# Patient Record
Sex: Male | Born: 1940
Health system: Southern US, Community
[De-identification: ages and names within clinical notes are randomized; demographics above are authoritative.]

## PROBLEM LIST (undated history)

## (undated) DIAGNOSIS — J45909 Unspecified asthma, uncomplicated: Secondary | ICD-10-CM

## (undated) DIAGNOSIS — N400 Enlarged prostate without lower urinary tract symptoms: Secondary | ICD-10-CM

## (undated) DIAGNOSIS — C801 Malignant (primary) neoplasm, unspecified: Secondary | ICD-10-CM

## (undated) DIAGNOSIS — T4145XA Adverse effect of unspecified anesthetic, initial encounter: Secondary | ICD-10-CM

## (undated) DIAGNOSIS — F32A Depression, unspecified: Secondary | ICD-10-CM

## (undated) DIAGNOSIS — G250 Essential tremor: Secondary | ICD-10-CM

## (undated) DIAGNOSIS — K635 Polyp of colon: Secondary | ICD-10-CM

## (undated) DIAGNOSIS — R0789 Other chest pain: Secondary | ICD-10-CM

## (undated) DIAGNOSIS — A048 Other specified bacterial intestinal infections: Secondary | ICD-10-CM

## (undated) DIAGNOSIS — K219 Gastro-esophageal reflux disease without esophagitis: Secondary | ICD-10-CM

## (undated) DIAGNOSIS — F419 Anxiety disorder, unspecified: Secondary | ICD-10-CM

## (undated) DIAGNOSIS — M549 Dorsalgia, unspecified: Secondary | ICD-10-CM

## (undated) DIAGNOSIS — E785 Hyperlipidemia, unspecified: Secondary | ICD-10-CM

## (undated) DIAGNOSIS — G2581 Restless legs syndrome: Secondary | ICD-10-CM

## (undated) DIAGNOSIS — T8859XA Other complications of anesthesia, initial encounter: Secondary | ICD-10-CM

## (undated) DIAGNOSIS — F329 Major depressive disorder, single episode, unspecified: Secondary | ICD-10-CM

## (undated) DIAGNOSIS — G709 Myoneural disorder, unspecified: Secondary | ICD-10-CM

## (undated) DIAGNOSIS — I10 Essential (primary) hypertension: Secondary | ICD-10-CM

## (undated) DIAGNOSIS — J449 Chronic obstructive pulmonary disease, unspecified: Secondary | ICD-10-CM

## (undated) DIAGNOSIS — Z8489 Family history of other specified conditions: Secondary | ICD-10-CM

## (undated) HISTORY — PX: NASAL SINUS SURGERY: SHX719

## (undated) HISTORY — DX: Unspecified asthma, uncomplicated: J45.909

## (undated) HISTORY — DX: Essential tremor: G25.0

## (undated) HISTORY — PX: TRIGGER FINGER RELEASE: SHX641

## (undated) HISTORY — PX: HERNIA REPAIR: SHX51

## (undated) HISTORY — PX: ROTATOR CUFF REPAIR: SHX139

## (undated) HISTORY — PX: ESOPHAGOGASTRODUODENOSCOPY: SHX1529

## (undated) HISTORY — PX: CATARACT EXTRACTION: SUR2

---
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
## 2005-10-31 ENCOUNTER — Ambulatory Visit: Payer: Self-pay | Admitting: Unknown Physician Specialty

## 2006-02-03 ENCOUNTER — Ambulatory Visit: Payer: Self-pay | Admitting: Unknown Physician Specialty

## 2006-06-09 ENCOUNTER — Ambulatory Visit: Payer: Self-pay | Admitting: Internal Medicine

## 2007-02-11 ENCOUNTER — Ambulatory Visit: Payer: Self-pay | Admitting: Family Medicine

## 2011-01-04 HISTORY — PX: COLONOSCOPY: SHX174

## 2011-01-10 ENCOUNTER — Ambulatory Visit: Payer: Self-pay | Admitting: Unknown Physician Specialty

## 2011-01-12 LAB — PATHOLOGY REPORT

## 2011-09-12 ENCOUNTER — Ambulatory Visit: Payer: Self-pay | Admitting: Unknown Physician Specialty

## 2011-10-14 ENCOUNTER — Ambulatory Visit: Payer: Self-pay | Admitting: Internal Medicine

## 2011-12-12 ENCOUNTER — Ambulatory Visit: Payer: Self-pay | Admitting: Unknown Physician Specialty

## 2011-12-13 LAB — PATHOLOGY REPORT

## 2012-11-21 ENCOUNTER — Ambulatory Visit: Payer: Self-pay | Admitting: Internal Medicine

## 2012-11-21 ENCOUNTER — Encounter: Payer: Self-pay | Admitting: Pulmonary Disease

## 2013-11-04 ENCOUNTER — Encounter: Payer: Self-pay | Admitting: Pulmonary Disease

## 2013-11-04 ENCOUNTER — Ambulatory Visit: Payer: Self-pay | Admitting: Specialist

## 2014-03-21 DIAGNOSIS — J330 Polyp of nasal cavity: Secondary | ICD-10-CM | POA: Diagnosis not present

## 2014-03-21 DIAGNOSIS — J328 Other chronic sinusitis: Secondary | ICD-10-CM | POA: Diagnosis not present

## 2014-03-21 DIAGNOSIS — R0981 Nasal congestion: Secondary | ICD-10-CM | POA: Diagnosis not present

## 2014-03-26 DIAGNOSIS — G250 Essential tremor: Secondary | ICD-10-CM | POA: Diagnosis not present

## 2014-03-26 DIAGNOSIS — J328 Other chronic sinusitis: Secondary | ICD-10-CM | POA: Diagnosis not present

## 2014-04-01 DIAGNOSIS — J986 Disorders of diaphragm: Secondary | ICD-10-CM | POA: Diagnosis not present

## 2014-04-01 DIAGNOSIS — J449 Chronic obstructive pulmonary disease, unspecified: Secondary | ICD-10-CM | POA: Diagnosis not present

## 2014-04-02 DIAGNOSIS — E780 Pure hypercholesterolemia: Secondary | ICD-10-CM | POA: Diagnosis not present

## 2014-04-09 DIAGNOSIS — E780 Pure hypercholesterolemia: Secondary | ICD-10-CM | POA: Diagnosis not present

## 2014-04-09 DIAGNOSIS — G250 Essential tremor: Secondary | ICD-10-CM | POA: Diagnosis not present

## 2014-04-09 DIAGNOSIS — I10 Essential (primary) hypertension: Secondary | ICD-10-CM | POA: Diagnosis not present

## 2014-04-09 DIAGNOSIS — F419 Anxiety disorder, unspecified: Secondary | ICD-10-CM | POA: Diagnosis not present

## 2014-04-10 DIAGNOSIS — J330 Polyp of nasal cavity: Secondary | ICD-10-CM | POA: Diagnosis not present

## 2014-04-10 DIAGNOSIS — J328 Other chronic sinusitis: Secondary | ICD-10-CM | POA: Diagnosis not present

## 2014-04-17 ENCOUNTER — Ambulatory Visit
Admit: 2014-04-17 | Disposition: A | Discharge status: Home / Self Care | Payer: Self-pay | Attending: Otolaryngology | Admitting: Otolaryngology

## 2014-04-17 DIAGNOSIS — Z87891 Personal history of nicotine dependence: Secondary | ICD-10-CM | POA: Diagnosis not present

## 2014-04-17 DIAGNOSIS — J324 Chronic pansinusitis: Secondary | ICD-10-CM | POA: Diagnosis not present

## 2014-04-17 DIAGNOSIS — J449 Chronic obstructive pulmonary disease, unspecified: Secondary | ICD-10-CM | POA: Diagnosis not present

## 2014-04-17 DIAGNOSIS — Z9889 Other specified postprocedural states: Secondary | ICD-10-CM | POA: Diagnosis not present

## 2014-04-17 DIAGNOSIS — J329 Chronic sinusitis, unspecified: Secondary | ICD-10-CM | POA: Diagnosis not present

## 2014-04-17 DIAGNOSIS — Z79899 Other long term (current) drug therapy: Secondary | ICD-10-CM | POA: Diagnosis not present

## 2014-04-17 DIAGNOSIS — J45909 Unspecified asthma, uncomplicated: Secondary | ICD-10-CM | POA: Diagnosis not present

## 2014-04-17 DIAGNOSIS — J338 Other polyp of sinus: Secondary | ICD-10-CM | POA: Diagnosis not present

## 2014-04-17 DIAGNOSIS — J322 Chronic ethmoidal sinusitis: Secondary | ICD-10-CM | POA: Diagnosis not present

## 2014-04-17 DIAGNOSIS — J323 Chronic sphenoidal sinusitis: Secondary | ICD-10-CM | POA: Diagnosis not present

## 2014-04-17 DIAGNOSIS — K219 Gastro-esophageal reflux disease without esophagitis: Secondary | ICD-10-CM | POA: Diagnosis not present

## 2014-04-17 DIAGNOSIS — Z91013 Allergy to seafood: Secondary | ICD-10-CM | POA: Diagnosis not present

## 2014-04-17 DIAGNOSIS — J330 Polyp of nasal cavity: Secondary | ICD-10-CM | POA: Diagnosis not present

## 2014-04-17 DIAGNOSIS — J320 Chronic maxillary sinusitis: Secondary | ICD-10-CM | POA: Diagnosis not present

## 2014-04-17 DIAGNOSIS — J321 Chronic frontal sinusitis: Secondary | ICD-10-CM | POA: Diagnosis not present

## 2014-04-23 DIAGNOSIS — J330 Polyp of nasal cavity: Secondary | ICD-10-CM | POA: Diagnosis not present

## 2014-04-23 DIAGNOSIS — J328 Other chronic sinusitis: Secondary | ICD-10-CM | POA: Diagnosis not present

## 2014-04-28 LAB — SURGICAL PATHOLOGY

## 2014-05-02 DIAGNOSIS — J328 Other chronic sinusitis: Secondary | ICD-10-CM | POA: Diagnosis not present

## 2014-05-09 DIAGNOSIS — J330 Polyp of nasal cavity: Secondary | ICD-10-CM | POA: Diagnosis not present

## 2014-05-09 DIAGNOSIS — J328 Other chronic sinusitis: Secondary | ICD-10-CM | POA: Diagnosis not present

## 2014-05-23 DIAGNOSIS — J328 Other chronic sinusitis: Secondary | ICD-10-CM | POA: Diagnosis not present

## 2014-05-23 DIAGNOSIS — J330 Polyp of nasal cavity: Secondary | ICD-10-CM | POA: Diagnosis not present

## 2014-05-28 DIAGNOSIS — G250 Essential tremor: Secondary | ICD-10-CM | POA: Diagnosis not present

## 2014-06-06 DIAGNOSIS — J330 Polyp of nasal cavity: Secondary | ICD-10-CM | POA: Diagnosis not present

## 2014-06-06 DIAGNOSIS — J301 Allergic rhinitis due to pollen: Secondary | ICD-10-CM | POA: Diagnosis not present

## 2014-06-06 DIAGNOSIS — J328 Other chronic sinusitis: Secondary | ICD-10-CM | POA: Diagnosis not present

## 2014-06-06 DIAGNOSIS — J0101 Acute recurrent maxillary sinusitis: Secondary | ICD-10-CM | POA: Diagnosis not present

## 2014-06-10 DIAGNOSIS — J301 Allergic rhinitis due to pollen: Secondary | ICD-10-CM | POA: Diagnosis not present

## 2014-06-26 DIAGNOSIS — J301 Allergic rhinitis due to pollen: Secondary | ICD-10-CM | POA: Diagnosis not present

## 2014-06-26 DIAGNOSIS — J330 Polyp of nasal cavity: Secondary | ICD-10-CM | POA: Diagnosis not present

## 2014-07-01 DIAGNOSIS — K4090 Unilateral inguinal hernia, without obstruction or gangrene, not specified as recurrent: Secondary | ICD-10-CM | POA: Diagnosis not present

## 2014-07-10 ENCOUNTER — Ambulatory Visit: Payer: Self-pay | Admitting: General Surgery

## 2014-07-10 ENCOUNTER — Encounter: Payer: Self-pay | Admitting: General Surgery

## 2014-07-10 ENCOUNTER — Ambulatory Visit (INDEPENDENT_AMBULATORY_CARE_PROVIDER_SITE_OTHER): Payer: Commercial Managed Care - HMO | Admitting: General Surgery

## 2014-07-10 VITALS — BP 110/62 | HR 68 | Resp 12 | Ht 70.0 in | Wt 197.0 lb

## 2014-07-10 DIAGNOSIS — G250 Essential tremor: Secondary | ICD-10-CM | POA: Diagnosis not present

## 2014-07-10 DIAGNOSIS — K4090 Unilateral inguinal hernia, without obstruction or gangrene, not specified as recurrent: Secondary | ICD-10-CM

## 2014-07-10 NOTE — Patient Instructions (Addendum)

## 2014-07-10 NOTE — Progress Notes (Signed)
Patient ID: Wesley Preston, male   DOB: 1940/01/10, 74 y.o.   MRN: [id number]  Chief Complaint  Patient presents with  . Other    right inguinal hernia    HPI Wesley Preston is a 74 y.o. male here today for a evaluation of a right inguinal hernia. Patient noticed this about two weeks again. He states the area pops in and out. He noticed some swelling the week before he felt the knot.  HPI  Past Medical History  Diagnosis Date  . Reactive airway disease   . Essential tremor     Past Surgical History  Procedure Laterality Date  . Colonoscopy  2013  . Nasal sinus surgery      Family History  Problem Relation Age of Onset  . Heart disease Father     Social History History  Substance Use Topics  . Smoking status: Former Smoker -- 1.00 packs/day for 20 years    Types: Cigarettes  . Smokeless tobacco: Not on file  . Alcohol Use: 14.4 oz/week    24 Standard drinks or equivalent per week    No Known Allergies  Current Outpatient Prescriptions  Medication Sig Dispense Refill  . Docusate Calcium (STOOL SOFTENER PO)     . doxazosin (CARDURA) 4 MG tablet     . gabapentin (NEURONTIN) 100 MG capsule TAKE 1 CAPSULE (100 MG TOTAL) BY MOUTH 2 (TWO) TIMES DAILY.  3  . propranolol ER (INDERAL LA) 60 MG 24 hr capsule Take by mouth.    . SYMBICORT 160-4.5 MCG/ACT inhaler      No current facility-administered medications for this visit.    Review of Systems Review of Systems  Constitutional: Negative.   Respiratory: Positive for shortness of breath.   Cardiovascular: Negative.     Blood pressure 110/62, pulse 68, resp. rate 12, height 5\' 10"  (1.778 m), weight 197 lb (89.359 kg).  Physical Exam Physical Exam  Constitutional: He is oriented to person, place, and time. He appears well-developed and well-nourished.  HENT:  Mouth/Throat: Oropharynx is clear and moist.  Eyes: Conjunctivae are normal. No scleral icterus.  Neck: Neck supple.  Cardiovascular: Normal rate, regular  rhythm and normal heart sounds.   Pulmonary/Chest: Effort normal and breath sounds normal.  Abdominal: Soft. Normal appearance and bowel sounds are normal. There is no tenderness. A hernia is present. Hernia confirmed positive in the right inguinal area.  Lymphadenopathy:    He has no cervical adenopathy.  Neurological: He is alert and oriented to person, place, and time.  Skin: Skin is warm and dry.    Data Reviewed Medical evaluation by Ramonita Lab, M.D. dated 07/01/2014 was reviewed.  Assessment    Right inguinal hernia, symptomatic.    Plan         Hernia precautions and incarceration were discussed with the patient. If they develop symptoms of an incarcerated hernia, they were encouraged to seek prompt medical attention.  I have recommended repair of the hernia using mesh on an outpatient basis in the near future. The risk of infection was reviewed. The role of prosthetic mesh to minimize the risk of recurrence was reviewed.  Patient's surgery has been scheduled for 08-01-14 at [facility].   PCP:  Ocie Cornfield 07/12/2014, 8:28 AM   Evaluation by Beatris Si

## 2014-07-11 ENCOUNTER — Telehealth: Payer: Self-pay

## 2014-07-11 NOTE — Telephone Encounter (Signed)
Patient called and wanted to reschedule his surgery scheduled for 07/25/14. The patient is not rescheduled for surgery at [facility] on 08/01/14. He is aware of date and instructions. OR scheduling has been notified of this change.

## 2014-07-12 DIAGNOSIS — K4090 Unilateral inguinal hernia, without obstruction or gangrene, not specified as recurrent: Secondary | ICD-10-CM | POA: Insufficient documentation

## 2014-07-12 NOTE — H&P (Signed)
Patient ID: Wesley Preston, male DOB: March 05, 1940, 74 y.o. MRN: [id number]  Chief Complaint   Patient presents with   .  Other     right inguinal hernia    HPI  Wesley Preston is a 74 y.o. male here today for a evaluation of a right inguinal hernia. Patient noticed this about two weeks again. He states the area pops in and out. He noticed some swelling the week before he felt the knot.  HPI  Past Medical History   Diagnosis  Date   .  Reactive airway disease    .  Essential tremor     Past Surgical History   Procedure  Laterality  Date   .  Colonoscopy   2013   .  Nasal sinus surgery      Family History   Problem  Relation  Age of Onset   .  Heart disease  Father     Social History  History   Substance Use Topics   .  Smoking status:  Former Smoker -- 1.00 packs/day for 20 years     Types:  Cigarettes   .  Smokeless tobacco:  Not on file   .  Alcohol Use:  14.4 oz/week     24 Standard drinks or equivalent per week    No Known Allergies  Current Outpatient Prescriptions   Medication  Sig  Dispense  Refill   .  Docusate Calcium (STOOL SOFTENER PO)      .  doxazosin (CARDURA) 4 MG tablet      .  gabapentin (NEURONTIN) 100 MG capsule  TAKE 1 CAPSULE (100 MG TOTAL) BY MOUTH 2 (TWO) TIMES DAILY.   3   .  propranolol ER (INDERAL LA) 60 MG 24 hr capsule  Take by mouth.     .  SYMBICORT 160-4.5 MCG/ACT inhaler       No current facility-administered medications for this visit.    Review of Systems  Review of Systems  Constitutional: Negative.  Respiratory: Positive for shortness of breath.  Cardiovascular: Negative.   Blood pressure 110/62, pulse 68, resp. rate 12, height 5\' 10"  (1.778 m), weight 197 lb (89.359 kg).  Physical Exam  Physical Exam  Constitutional: He is oriented to person, place, and time. He appears well-developed and well-nourished.  HENT:  Mouth/Throat: Oropharynx is clear and moist.  Eyes: Conjunctivae are normal. No scleral icterus.  Neck: Neck supple.   Cardiovascular: Normal rate, regular rhythm and normal heart sounds.  Pulmonary/Chest: Effort normal and breath sounds normal.  Abdominal: Soft. Normal appearance and bowel sounds are normal. There is no tenderness. A hernia is present. Hernia confirmed positive in the right inguinal area.  Lymphadenopathy:  He has no cervical adenopathy.  Neurological: He is alert and oriented to person, place, and time.  Skin: Skin is warm and dry.   Data Reviewed  Medical evaluation by Ramonita Lab, M.D. dated 07/01/2014 was reviewed.  Assessment   Right inguinal hernia, symptomatic.   Plan    Hernia precautions and incarceration were discussed with the patient. If they develop symptoms of an incarcerated hernia, they were encouraged to seek prompt medical attention.  I have recommended repair of the hernia using mesh on an outpatient basis in the near future. The risk of infection was reviewed. The role of prosthetic mesh to minimize the risk of recurrence was reviewed.  Patient's surgery has been scheduled for 08-01-14 at [facility].  PCP: Ocie Cornfield  07/12/2014, 8:28 AM

## 2014-07-16 ENCOUNTER — Encounter: Payer: Self-pay | Admitting: *Deleted

## 2014-07-16 ENCOUNTER — Inpatient Hospital Stay: Admission: RE | Admit: 2014-07-16 | Payer: Self-pay | Source: Ambulatory Visit

## 2014-07-16 NOTE — Patient Instructions (Signed)
  Your procedure is scheduled on: 08-01-14 Report to [place] To find out your arrival time please call [phone] between 1PM - 3PM on 07-31-14 (THURSDAY)  Remember: Instructions that are not followed completely may result in serious medical risk, up to and including death, or upon the discretion of your surgeon and anesthesiologist your surgery may need to be rescheduled.    _X___ 1. Do not eat food or drink liquids after midnight. No gum chewing or hard candies.     _X___ 2. No Alcohol for 24 hours before or after surgery.   ____ 3. Bring all medications with you on the day of surgery if instructed.    _X___ 4. Notify your doctor if there is any change in your medical condition     (cold, fever, infections).     Do not wear jewelry, make-up, hairpins, clips or nail polish.  Do not wear lotions, powders, or perfumes. You may wear deodorant.  Do not shave 48 hours prior to surgery. Men may shave face and neck.  Do not bring valuables to the hospital.    [facility] is not responsible for any belongings or valuables.               Contacts, dentures or bridgework may not be worn into surgery.  Leave your suitcase in the car. After surgery it may be brought to your room.  For patients admitted to the hospital, discharge time is determined by your treatment team.   Patients discharged the day of surgery will not be allowed to drive home.   Please read over the following fact sheets that you were given:      _X___ Take these medicines the morning of surgery with A SIP OF WATER:    1. PROPRANOLOL  2. GABAPENTIN  3.   4.  5.  6.  ____ Fleet Enema (as directed)   ____ Use CHG Soap as directed  __X__ Use inhalers on the day of surgery-USE SYMBICORT AT HOME   ____ Stop metformin 2 days prior to surgery    ____ Take 1/2 of usual insulin dose the night before surgery and none on the morning of surgery.   ____ Stop  Coumadin/Plavix/aspirin-N/A  ____ Stop Anti-inflammatories-NO NSAIDS OR ASPIRIN PRODUCTS-TYLENOL OK   ____ Stop supplements until after surgery.    ____ Bring C-Pap to the hospital.

## 2014-07-23 ENCOUNTER — Encounter
Admission: RE | Admit: 2014-07-23 | Discharge: 2014-07-23 | Disposition: A | Discharge status: Home / Self Care | Payer: Commercial Managed Care - HMO | Source: Ambulatory Visit | Attending: Anesthesiology | Admitting: Anesthesiology

## 2014-07-23 DIAGNOSIS — Z01810 Encounter for preprocedural cardiovascular examination: Secondary | ICD-10-CM | POA: Diagnosis not present

## 2014-07-25 NOTE — Pre-Procedure Instructions (Signed)
EKG SENT OVER TO DR Amie Critchley FOR REVIEW-OK PER DR Julien Girt

## 2014-08-01 ENCOUNTER — Ambulatory Visit: Payer: Commercial Managed Care - HMO | Admitting: *Deleted

## 2014-08-01 ENCOUNTER — Encounter
Admission: RE | Disposition: A | Discharge status: Home / Self Care | Payer: Self-pay | Source: Ambulatory Visit | Attending: General Surgery

## 2014-08-01 ENCOUNTER — Ambulatory Visit
Admission: RE | Admit: 2014-08-01 | Discharge: 2014-08-01 | Disposition: A | Discharge status: Home / Self Care | Payer: Commercial Managed Care - HMO | Source: Ambulatory Visit | Attending: General Surgery | Admitting: General Surgery

## 2014-08-01 DIAGNOSIS — Z87891 Personal history of nicotine dependence: Secondary | ICD-10-CM | POA: Diagnosis not present

## 2014-08-01 DIAGNOSIS — Z79899 Other long term (current) drug therapy: Secondary | ICD-10-CM | POA: Diagnosis not present

## 2014-08-01 DIAGNOSIS — K219 Gastro-esophageal reflux disease without esophagitis: Secondary | ICD-10-CM | POA: Diagnosis not present

## 2014-08-01 DIAGNOSIS — K4090 Unilateral inguinal hernia, without obstruction or gangrene, not specified as recurrent: Secondary | ICD-10-CM | POA: Insufficient documentation

## 2014-08-01 DIAGNOSIS — Z8249 Family history of ischemic heart disease and other diseases of the circulatory system: Secondary | ICD-10-CM | POA: Diagnosis not present

## 2014-08-01 DIAGNOSIS — J45909 Unspecified asthma, uncomplicated: Secondary | ICD-10-CM | POA: Insufficient documentation

## 2014-08-01 DIAGNOSIS — I10 Essential (primary) hypertension: Secondary | ICD-10-CM | POA: Insufficient documentation

## 2014-08-01 DIAGNOSIS — J449 Chronic obstructive pulmonary disease, unspecified: Secondary | ICD-10-CM | POA: Diagnosis not present

## 2014-08-01 DIAGNOSIS — Z9889 Other specified postprocedural states: Secondary | ICD-10-CM | POA: Insufficient documentation

## 2014-08-01 DIAGNOSIS — R251 Tremor, unspecified: Secondary | ICD-10-CM | POA: Diagnosis not present

## 2014-08-01 HISTORY — DX: Family history of other specified conditions: Z84.89

## 2014-08-01 HISTORY — DX: Gastro-esophageal reflux disease without esophagitis: K21.9

## 2014-08-01 HISTORY — DX: Chronic obstructive pulmonary disease, unspecified: J44.9

## 2014-08-01 HISTORY — DX: Myoneural disorder, unspecified: G70.9

## 2014-08-01 HISTORY — PX: INGUINAL HERNIA REPAIR: SHX194

## 2014-08-01 HISTORY — DX: Malignant (primary) neoplasm, unspecified: C80.1

## 2014-08-01 SURGERY — REPAIR, HERNIA, INGUINAL, ADULT
Anesthesia: General | Laterality: Right | Wound class: Clean

## 2014-08-01 MED ORDER — METOCLOPRAMIDE HCL 5 MG/ML IJ SOLN: 10.0000 mg | Freq: Once | INTRAMUSCULAR | Status: DC | PRN

## 2014-08-01 MED ORDER — CEFAZOLIN SODIUM-DEXTROSE 2-3 GM-% IV SOLR
2.0000 g | INTRAVENOUS | Status: AC
  Administered 2014-08-01: 2 g via INTRAVENOUS

## 2014-08-01 MED ORDER — EPHEDRINE SULFATE 50 MG/ML IJ SOLN
INTRAMUSCULAR | Status: DC | PRN
  Administered 2014-08-01: 10 mg via INTRAVENOUS

## 2014-08-01 MED ORDER — FAMOTIDINE 20 MG PO TABS
ORAL_TABLET | ORAL | Status: AC
  Filled 2014-08-01: qty 1

## 2014-08-01 MED ORDER — HYDROCODONE-ACETAMINOPHEN 5-325 MG PO TABS: 1.0000 | ORAL_TABLET | ORAL | Status: DC | PRN

## 2014-08-01 MED ORDER — BUPIVACAINE-EPINEPHRINE (PF) 0.5% -1:200000 IJ SOLN
INTRAMUSCULAR | Status: DC | PRN
Start: 2014-08-01 — End: 2014-08-01
  Administered 2014-08-01: 30 mL via PERINEURAL

## 2014-08-01 MED ORDER — FENTANYL CITRATE (PF) 100 MCG/2ML IJ SOLN: 25.0000 ug | INTRAMUSCULAR | Status: DC | PRN

## 2014-08-01 MED ORDER — HYDROCODONE-ACETAMINOPHEN 7.5-325 MG PO TABS: 1.0000 | ORAL_TABLET | Freq: Once | ORAL | Status: DC | PRN

## 2014-08-01 MED ORDER — FAMOTIDINE 20 MG PO TABS
20.0000 mg | ORAL_TABLET | Freq: Once | ORAL | Status: AC
  Administered 2014-08-01: 20 mg via ORAL

## 2014-08-01 MED ORDER — MIDAZOLAM HCL 2 MG/2ML IJ SOLN
INTRAMUSCULAR | Status: DC | PRN
  Administered 2014-08-01: 2 mg via INTRAVENOUS

## 2014-08-01 MED ORDER — FENTANYL CITRATE (PF) 100 MCG/2ML IJ SOLN
INTRAMUSCULAR | Status: DC | PRN
  Administered 2014-08-01 (×2): 50 ug via INTRAVENOUS

## 2014-08-01 MED ORDER — GLYCOPYRROLATE 0.2 MG/ML IJ SOLN
INTRAMUSCULAR | Status: DC | PRN
  Administered 2014-08-01: 0.2 mg via INTRAVENOUS

## 2014-08-01 MED ORDER — PROPOFOL 10 MG/ML IV BOLUS
INTRAVENOUS | Status: DC | PRN
  Administered 2014-08-01: 200 mg via INTRAVENOUS

## 2014-08-01 MED ORDER — BUPIVACAINE-EPINEPHRINE (PF) 0.5% -1:200000 IJ SOLN
INTRAMUSCULAR | Status: AC
  Filled 2014-08-01: qty 30

## 2014-08-01 MED ORDER — ACETAMINOPHEN 10 MG/ML IV SOLN
INTRAVENOUS | Status: DC | PRN
  Administered 2014-08-01: 1000 mg via INTRAVENOUS

## 2014-08-01 MED ORDER — ACETAMINOPHEN 10 MG/ML IV SOLN
INTRAVENOUS | Status: AC
  Filled 2014-08-01: qty 100

## 2014-08-01 MED ORDER — CEFAZOLIN SODIUM-DEXTROSE 2-3 GM-% IV SOLR
INTRAVENOUS | Status: AC
  Filled 2014-08-01: qty 50

## 2014-08-01 MED ORDER — KETOROLAC TROMETHAMINE 30 MG/ML IJ SOLN
INTRAMUSCULAR | Status: DC | PRN
  Administered 2014-08-01: 30 mg

## 2014-08-01 MED ORDER — PHENYLEPHRINE HCL 10 MG/ML IJ SOLN
INTRAMUSCULAR | Status: DC | PRN
  Administered 2014-08-01 (×3): 100 ug via INTRAVENOUS
  Administered 2014-08-01: 200 ug via INTRAVENOUS

## 2014-08-01 MED ORDER — LACTATED RINGERS IV SOLN
INTRAVENOUS | Status: DC
  Administered 2014-08-01 (×2): via INTRAVENOUS

## 2014-08-01 MED ORDER — LIDOCAINE HCL (CARDIAC) 20 MG/ML IV SOLN
INTRAVENOUS | Status: DC | PRN
  Administered 2014-08-01: 50 mg via INTRAVENOUS

## 2014-08-01 SURGICAL SUPPLY — 34 items
BENZOIN TINCTURE PRP APPL 2/3 (GAUZE/BANDAGES/DRESSINGS) ×3 IMPLANT
BLADE SURG 15 STRL SS SAFETY (BLADE) ×6 IMPLANT
CANISTER SUCT 1200ML W/VALVE (MISCELLANEOUS) ×3 IMPLANT
CHLORAPREP W/TINT 26ML (MISCELLANEOUS) ×3 IMPLANT
CLOSURE WOUND 1/2 X4 (GAUZE/BANDAGES/DRESSINGS) ×1
DECANTER SPIKE VIAL GLASS SM (MISCELLANEOUS) IMPLANT
DRAIN PENROSE 1/4X12 LTX (DRAIN) ×3 IMPLANT
DRAPE LAPAROTOMY 100X77 ABD (DRAPES) ×3 IMPLANT
DRESSING TELFA 4X3 1S ST N-ADH (GAUZE/BANDAGES/DRESSINGS) IMPLANT
DRSG TEGADERM 4X4.75 (GAUZE/BANDAGES/DRESSINGS) ×3 IMPLANT
DRSG TELFA 3X8 NADH (GAUZE/BANDAGES/DRESSINGS) ×3 IMPLANT
GLOVE BIO SURGEON STRL SZ7.5 (GLOVE) ×9 IMPLANT
GLOVE INDICATOR 8.0 STRL GRN (GLOVE) ×9 IMPLANT
GOWN STRL REUS W/ TWL LRG LVL3 (GOWN DISPOSABLE) ×3 IMPLANT
GOWN STRL REUS W/TWL LRG LVL3 (GOWN DISPOSABLE) ×6
KIT RM TURNOVER STRD PROC AR (KITS) ×3 IMPLANT
LABEL OR SOLS (LABEL) ×3 IMPLANT
MESH HERNIA SYS ULTRAPRO LRG (Mesh General) ×3 IMPLANT
NDL SAFETY 22GX1.5 (NEEDLE) ×6 IMPLANT
NDL SAFETY 25GX1.5 (NEEDLE) ×3 IMPLANT
PACK BASIN MINOR ARMC (MISCELLANEOUS) ×3 IMPLANT
PAD GROUND ADULT SPLIT (MISCELLANEOUS) ×3 IMPLANT
STRIP CLOSURE SKIN 1/2X4 (GAUZE/BANDAGES/DRESSINGS) ×2 IMPLANT
SUT SURGILON 0 BLK (SUTURE) ×3 IMPLANT
SUT VIC AB 2-0 SH 27 (SUTURE) ×2
SUT VIC AB 2-0 SH 27XBRD (SUTURE) ×1 IMPLANT
SUT VIC AB 3-0 54X BRD REEL (SUTURE) ×1 IMPLANT
SUT VIC AB 3-0 BRD 54 (SUTURE) ×2
SUT VIC AB 3-0 SH 27 (SUTURE) ×2
SUT VIC AB 3-0 SH 27X BRD (SUTURE) ×1 IMPLANT
SUT VIC AB 4-0 FS2 27 (SUTURE) ×3 IMPLANT
SWABSTK COMLB BENZOIN TINCTURE (MISCELLANEOUS) ×3 IMPLANT
SYR 3ML LL SCALE MARK (SYRINGE) ×3 IMPLANT
SYR CONTROL 10ML (SYRINGE) ×3 IMPLANT

## 2014-08-01 NOTE — Anesthesia Preprocedure Evaluation (Signed)
Anesthesia Evaluation  Patient identified by MRN, date of birth, ID band Patient awake    Reviewed: Allergy & Precautions, NPO status , Patient's Chart, lab work & pertinent test results  Airway Mallampati: III  TM Distance: >3 FB Neck ROM: Limited    Dental  (+) Partial Lower   Pulmonary COPD COPD inhaler, former smoker,    Pulmonary exam normal       Cardiovascular hypertension, Pt. on medications and Pt. on home beta blockers Normal cardiovascular exam ECG SR, ?RVH, ? Old lateral infarct--but no sx or Hx.   Neuro/Psych    GI/Hepatic   Endo/Other    Renal/GU      Musculoskeletal   Abdominal Normal abdominal exam  (+)   Peds  Hematology   Anesthesia Other Findings   Reproductive/Obstetrics                             Anesthesia Physical Anesthesia Plan  ASA: III  Anesthesia Plan: General   Post-op Pain Management:    Induction: Intravenous  Airway Management Planned: LMA  Additional Equipment:   Intra-op Plan:   Post-operative Plan: Extubation in OR  Informed Consent: I have reviewed the patients History and Physical, chart, labs and discussed the procedure including the risks, benefits and alternatives for the proposed anesthesia with the patient or authorized representative who has indicated his/her understanding and acceptance.     Plan Discussed with: CRNA  Anesthesia Plan Comments:         Anesthesia Quick Evaluation

## 2014-08-01 NOTE — Discharge Instructions (Addendum)
NO Driving for next 48 hours ICE to affected area for 48 hours on and off Jocky/Supportive Shorts for support Tylenol as needed for pain Norco as needed for pain  AMBULATORY SURGERY  DISCHARGE INSTRUCTIONS  1) The drugs that you were given will stay in your system until tomorrow so for the next 24 hours you should not: A) Drive an automobile B) Make any legal decisions C) Drink any alcoholic beverage  2) You may resume regular meals tomorrow.  Today it is better to start with liquids and gradually work up to solid foods. You may eat anything you prefer, but it is better to start with liquids, then soup and crackers, and gradually work up to solid foods.  3) Please notify your doctor immediately if you have any unusual bleeding, trouble breathing, redness and pain at the surgery site, drainage, fever, or pain not relieved by medication.  4) Additional Instructions:  Please contact your physician with any problems or Same Day Surgery at [phone], Monday through Friday 6 am to 4 pm, or [place] number at [phone].

## 2014-08-01 NOTE — Transfer of Care (Signed)
Immediate Anesthesia Transfer of Care Note  Patient: Wesley Preston  Procedure(s) Performed: Procedure(s): RIGHT INGUINAL HERNIA  REPAIR WITH MESH  (Right)  Patient Location: PACU  Anesthesia Type:General  Level of Consciousness: Alert, Awake, Oriented  Airway & Oxygen Therapy: Patient Spontanous Breathing  Post-op Assessment: Report given to RN  Post vital signs: Reviewed and stable  Last Vitals:  Filed Vitals:   08/01/14 0932  BP: 114/65  Pulse: 72  Temp: 36.2 C  Resp: 10    Complications: No apparent anesthesia complications

## 2014-08-01 NOTE — Anesthesia Postprocedure Evaluation (Signed)
  Anesthesia Post-op Note  Patient: Wesley Preston  Procedure(s) Performed: Procedure(s): RIGHT INGUINAL HERNIA  REPAIR WITH MESH  (Right)  Anesthesia type:General  Patient location: PACU  Post pain: Pain level controlled  Post assessment: Post-op Vital signs reviewed, Patient's Cardiovascular Status Stable, Respiratory Function Stable, Patent Airway and No signs of Nausea or vomiting  Post vital signs: Reviewed and stable  Last Vitals:  Filed Vitals:   08/01/14 0950  BP: 102/65  Pulse: 64  Temp:   Resp: 13    Level of consciousness: awake, alert  and patient cooperative  Complications: No apparent anesthesia complications

## 2014-08-01 NOTE — Op Note (Signed)
Preoperative diagnosis: Right inguinal hernia.  Postoperative diagnosis: Same.  Operative procedure: Right inguinal hernia repair with large Ultra Pro mesh.  Operating surgeon: Hervey Ard, M.D.  Anesthesia: Gen. by LMA, Marcaine 0.5% with 1-200,000 epinephrine, 30 mL, Toradol 30 mg.  Estimated blood loss: Less than 5 mL.  Clinical note this 74 year old male developed symptomatic right inguinal hernia. He was treated for elective repair. Here was removed from the surgical site with clippers prior to presentation to the operating room. He received Kefzol intravenously for IV prophylaxis.  Operative note: With the patient under adequate general anesthesia the abdomen and groin was prepped with chlor prep and draped. Marcaine was infiltrated for postoperative analgesia and a field block technique. A 5 cm skin line incision along the anticipated course of the inguinal canal was carried out the skin subcutaneous tissue with hemostasis achieved by electrocautery. The external blood was opened in the direction of its fibers. The ilioinguinal and iliohypogastric nerves were identified and protected. The cord was mobilized. A sizable indirect defect was noted. This was freed circumferentially and returned to the preperitoneal space. The small hole in the sac was repaired with a running 3-0 Vicryls suture. The preperitoneal space was cleared and a large ultra Pro mesh was smoothed into position. The extra component was laid along the floor of the inguinal canal. A slit was made for cord passage. The mesh was anchored to the pubic tubercle along the inguinal ligament with interrupted 0 Surgilon sutures. The medial and superior borders were anchored to the transverse abdominis aponeurosis in similar fashion. Toward all is placed into the wound. The external oblique was closed with a running 2-0 Vicryls suture. The layer of Scarpa's fascia was closed with a running 3-0 Vicryls suture and the skin closed with a  running 4-0 Vicryls suture.  Benzoin, Steri-Strips, Telfa and Tegaderm dressing was applied.  The patient tolerated the procedure well and was taken to recovery in stable condition.

## 2014-08-01 NOTE — Anesthesia Procedure Notes (Signed)
Procedure Name: LMA Insertion Date/Time: 08/01/2014 8:38 AM Performed by: Eliberto Ivory Pre-anesthesia Checklist: Patient identified, Patient being monitored, Timeout performed, Emergency Drugs available and Suction available Patient Re-evaluated:Patient Re-evaluated prior to inductionOxygen Delivery Method: Circle system utilized Preoxygenation: Pre-oxygenation with 100% oxygen Intubation Type: IV induction Ventilation: Mask ventilation without difficulty LMA: LMA inserted LMA Size: 4.0 Tube type: Oral Number of attempts: 1 Placement Confirmation: positive ETCO2 and breath sounds checked- equal and bilateral Tube secured with: Tape Dental Injury: Teeth and Oropharynx as per pre-operative assessment

## 2014-08-01 NOTE — H&P (Signed)
No change in clinical history or exam. For right inguinal hernia repair.  

## 2014-08-12 ENCOUNTER — Ambulatory Visit (INDEPENDENT_AMBULATORY_CARE_PROVIDER_SITE_OTHER): Payer: Commercial Managed Care - HMO | Admitting: General Surgery

## 2014-08-12 ENCOUNTER — Encounter: Payer: Self-pay | Admitting: General Surgery

## 2014-08-12 VITALS — BP 132/74 | HR 72 | Resp 12 | Ht 70.0 in | Wt 191.0 lb

## 2014-08-12 DIAGNOSIS — K4090 Unilateral inguinal hernia, without obstruction or gangrene, not specified as recurrent: Secondary | ICD-10-CM

## 2014-08-12 NOTE — Patient Instructions (Addendum)
The patient is aware to call back for any questions or concerns. Gradually increase activity. Proper lifting techniques reviewed.

## 2014-08-12 NOTE — Progress Notes (Signed)
Patient ID: Wesley Preston, male   DOB: March 10, 1940, 74 y.o.   MRN: [id number]  Chief Complaint  Patient presents with  . Routine Post Op    hernia    HPI Wesley Preston is a 74 y.o. male.  Here today for postoperative visit, right inguinal hernia repair on 08-01-14. He states he is doing well. The patient reports she required narcotic analgesia 6 for the first 2 days after the surgery, none since that time. He did have mild constipation relieved with the use of an OTC laxative.   HPI  Past Medical History  Diagnosis Date  . Reactive airway disease   . Essential tremor   . COPD (chronic obstructive pulmonary disease)   . GERD (gastroesophageal reflux disease)     H/O  . Cancer     BASAL CELL  . Neuromuscular disorder     TREMORS  . Family history of adverse reaction to anesthesia     DAUGHTERS GET NAUSEATED    Past Surgical History  Procedure Laterality Date  . Colonoscopy  2013  . Nasal sinus surgery    . Rotator cuff repair    . Trigger finger release    . Inguinal hernia repair Right 08/01/2014    Procedure: RIGHT INGUINAL HERNIA  REPAIR WITH MESH ;  Surgeon: Robert Bellow, MD;  Location: [facility] ORS;  Service: General;  Laterality: Right; with large Ultra Pro mesh    Family History  Problem Relation Age of Onset  . Heart disease Father     Social History History  Substance Use Topics  . Smoking status: Former Smoker -- 1.00 packs/day for 20 years    Types: Cigarettes    Quit date: 07/16/1994  . Smokeless tobacco: Not on file  . Alcohol Use: 14.4 oz/week    24 Standard drinks or equivalent per week     Comment: WINE QHS    No Known Allergies  Current Outpatient Prescriptions  Medication Sig Dispense Refill  . Docusate Calcium (STOOL SOFTENER PO) daily.     Marland Kitchen doxazosin (CARDURA) 4 MG tablet     . gabapentin (NEURONTIN) 100 MG capsule TAKE 1 CAPSULE (100 MG TOTAL) BY MOUTH 2 (TWO) TIMES DAILY.  3  . propranolol ER (INDERAL LA) 60 MG 24 hr capsule Take 60 mg  by mouth every morning.     . SYMBICORT 160-4.5 MCG/ACT inhaler Inhale 2 puffs into the lungs every morning.      No current facility-administered medications for this visit.    Review of Systems Review of Systems  Constitutional: Negative.   Respiratory: Negative.   Cardiovascular: Negative.   Gastrointestinal: Negative for nausea, vomiting, diarrhea and constipation.    Blood pressure 132/74, pulse 72, resp. rate 12, height 5\' 10"  (1.778 m), weight 191 lb (86.637 kg).  Physical Exam Physical Exam  Constitutional: He is oriented to person, place, and time. He appears well-developed and well-nourished.  Abdominal: Soft. Normal appearance.    Incision healing well.  Neurological: He is alert and oriented to person, place, and time.  Skin: Skin is warm and dry.  Psychiatric: His behavior is normal.       Assessment    Doing well status post right inguinal hernia repair.    Plan    The patient will increase his activity as is comfortable.    Gradually increase activity. Proper lifting techniques reviewed. Follow up as needed.  PCP:  Damaris Hippo 08/12/2014, 12:51 PM

## 2014-10-07 DIAGNOSIS — Z125 Encounter for screening for malignant neoplasm of prostate: Secondary | ICD-10-CM | POA: Diagnosis not present

## 2014-10-07 DIAGNOSIS — I10 Essential (primary) hypertension: Secondary | ICD-10-CM | POA: Diagnosis not present

## 2014-10-07 DIAGNOSIS — Z79899 Other long term (current) drug therapy: Secondary | ICD-10-CM | POA: Diagnosis not present

## 2014-10-07 DIAGNOSIS — E7800 Pure hypercholesterolemia, unspecified: Secondary | ICD-10-CM | POA: Diagnosis not present

## 2014-10-10 DIAGNOSIS — G250 Essential tremor: Secondary | ICD-10-CM | POA: Diagnosis not present

## 2014-10-10 DIAGNOSIS — I10 Essential (primary) hypertension: Secondary | ICD-10-CM | POA: Diagnosis not present

## 2014-10-10 DIAGNOSIS — G629 Polyneuropathy, unspecified: Secondary | ICD-10-CM | POA: Diagnosis not present

## 2014-10-10 DIAGNOSIS — E7800 Pure hypercholesterolemia, unspecified: Secondary | ICD-10-CM | POA: Diagnosis not present

## 2014-10-10 DIAGNOSIS — J4520 Mild intermittent asthma, uncomplicated: Secondary | ICD-10-CM | POA: Diagnosis not present

## 2014-10-10 DIAGNOSIS — N401 Enlarged prostate with lower urinary tract symptoms: Secondary | ICD-10-CM | POA: Diagnosis not present

## 2014-10-10 DIAGNOSIS — Z0000 Encounter for general adult medical examination without abnormal findings: Secondary | ICD-10-CM | POA: Diagnosis not present

## 2014-10-10 DIAGNOSIS — Z23 Encounter for immunization: Secondary | ICD-10-CM | POA: Diagnosis not present

## 2014-10-14 DIAGNOSIS — J331 Polypoid sinus degeneration: Secondary | ICD-10-CM | POA: Diagnosis not present

## 2014-10-14 DIAGNOSIS — J320 Chronic maxillary sinusitis: Secondary | ICD-10-CM | POA: Diagnosis not present

## 2014-10-22 DIAGNOSIS — H5210 Myopia, unspecified eye: Secondary | ICD-10-CM | POA: Diagnosis not present

## 2014-10-22 DIAGNOSIS — H524 Presbyopia: Secondary | ICD-10-CM | POA: Diagnosis not present

## 2014-10-29 ENCOUNTER — Ambulatory Visit
Admission: RE | Admit: 2014-10-29 | Discharge: 2014-10-29 | Disposition: A | Discharge status: Home / Self Care | Payer: Commercial Managed Care - HMO | Source: Ambulatory Visit | Attending: Pulmonary Disease | Admitting: Pulmonary Disease

## 2014-10-29 ENCOUNTER — Encounter: Payer: Self-pay | Admitting: Pulmonary Disease

## 2014-10-29 ENCOUNTER — Ambulatory Visit (INDEPENDENT_AMBULATORY_CARE_PROVIDER_SITE_OTHER): Payer: Commercial Managed Care - HMO | Admitting: Pulmonary Disease

## 2014-10-29 VITALS — BP 118/72 | HR 67 | Ht 70.0 in | Wt 192.8 lb

## 2014-10-29 DIAGNOSIS — Z87891 Personal history of nicotine dependence: Secondary | ICD-10-CM

## 2014-10-29 DIAGNOSIS — R0600 Dyspnea, unspecified: Secondary | ICD-10-CM | POA: Diagnosis not present

## 2014-10-29 DIAGNOSIS — J441 Chronic obstructive pulmonary disease with (acute) exacerbation: Secondary | ICD-10-CM | POA: Diagnosis not present

## 2014-10-29 DIAGNOSIS — R918 Other nonspecific abnormal finding of lung field: Secondary | ICD-10-CM | POA: Diagnosis not present

## 2014-10-29 MED ORDER — ALBUTEROL SULFATE HFA 108 (90 BASE) MCG/ACT IN AERS: 2.0000 | INHALATION_SPRAY | Freq: Four times a day (QID) | RESPIRATORY_TRACT | Status: DC | PRN

## 2014-10-29 NOTE — Progress Notes (Signed)
PULMONARY CONSULT NOTE  Date of consult: 10/29/14 Reason for consultation: Dyspnea  Pt Profile:  10/29/14: initial [facility] pulm evaluation. Previously seen by Dr Raul Del of [facility]. Carries diagnosis of COPD, Gold II     HPI:  Pleasant 33 M with SOB of a couple yrs duration. He notes DOE when golfing and bending over. He can walk up to a half mile at a modest pace before stopping to catch his breath. He is able to walk a flight of stairs.He has undergone evaluation of this under Dr Gust Brooms and Dr Doy Hutching' guidance which has included CXR, Sniff Test, Echocardiogram and possibly a CT chest. These test results are not presently available to me other than a previous note by Dr Raul Del indicating moderate obstruction on PFTs. He has no other respiratory or chest symptoms and denies CP, cough, hemoptysis, LE edema, calf tenderness, PND and orthopnea. He has been treated with Symbibcort which he believes helps though he admits to only using it as needed. He has been tried on Spiriva which he does not believe helped at all. His symptoms have little day to day variation and no seasonal variation has been noted. He smoked remotely quitting more than 20 yrs ago He has no significant occupational or environmental exposures. He has never been hospitalized for respiratory problems.  Past Medical History  Diagnosis Date  . Reactive airway disease   . Essential tremor   . COPD (chronic obstructive pulmonary disease) ([place])   . GERD (gastroesophageal reflux disease)     H/O  . Cancer (HCC)     BASAL CELL  . Neuromuscular disorder ([place])     TREMORS  . Family history of adverse reaction to anesthesia     DAUGHTERS GET NAUSEATED   Past Surgical History  Procedure Laterality Date  . Colonoscopy  2013  . Nasal sinus surgery    . Rotator cuff repair    . Trigger finger release    . Inguinal hernia repair Right 08/01/2014    Procedure: RIGHT INGUINAL HERNIA  REPAIR WITH MESH ;  Surgeon: Robert Bellow, MD;  Location: [facility] ORS;  Service: General;  Laterality: Right; with large Ultra Pro mesh    MEDICATIONS: reviewed. Include Symbicort  Social History   Social History  . Marital Status: Married    Spouse Name: N/A  . Number of Children: N/A  . Years of Education: N/A   Occupational History  . Not on file.   Social History Main Topics  . Smoking status: Former Smoker -- 1.00 packs/day for 20 years    Types: Cigarettes    Quit date: 07/16/1994  . Smokeless tobacco: Not on file  . Alcohol Use: 14.4 oz/week    24 Standard drinks or equivalent per week     Comment: WINE QHS  . Drug Use: No  . Sexual Activity: Not on file   Other Topics Concern  . Not on file   Social History Narrative    Family History  Problem Relation Age of Onset  . Heart disease Father     ROS: as per HPI. Otherwise, a detailed ROS is noncontributory.  Filed Vitals:   10/29/14 0856  BP: 118/72  Pulse: 67  Height: 5\' 10"  (1.778 m)  Weight: 192 lb 12.8 oz (87.454 kg)  SpO2: 93%    EXAM:   Gen: WDWN in NAD HEENT: All WNL Neck: NO LAN, no JVD noted Lungs: mildly diminished BS throughout, normal percussion note throughout, no adventitious sounds Cardiovascular: Reg  rate, normal rhythm, no M noted Abdomen: Soft, NT +BS Ext: no C/C/E Neuro: CNs intact, motor/sens grossly intact Skin: No lesions noted   DATA:  Report of prior PFTs:  SPIROMETRY: FVC was 2.83 liters, 75% of predicted/Post 2.84, 75%, 0% Change FEV1 was 1.82, 62% of predicted/Post 2.01, 68%, 10% Change FEV1 ratio was 64/Post 71 FEF 25-75% liters per second was 25% of predicted/Post 44%, 80% Change  LUNG VOLUMES: TLC was 68% of predicted RV was 55% of predicted  DIFFUSION CAPACITY: DLCO was 63% of predicted DLCO/VA was 111% of predicted   IMPRESSION:   Mild to moderate DOE appears to be due to obstructive lung disease/COPD. Prior PFTs indicate modest reversibility  PLAN:  Discussed medications and  encouraged that Symbicort be used on a scheduled basis - 2 actuations BID Prescribed PRN albuterol MDI and recommended that it can be used in anticipation of exertion such as when he golfs or hunts CXR today I will attempt to obtain results of prior testing including CT chest (if done), echocardiogram (if done), Sniff test, PFT report ROV in 2-4 weeks  Merton Border, MD PCCM service Mobile [phone] Pager [phone]

## 2014-11-20 ENCOUNTER — Ambulatory Visit (INDEPENDENT_AMBULATORY_CARE_PROVIDER_SITE_OTHER): Payer: Commercial Managed Care - HMO | Admitting: Pulmonary Disease

## 2014-11-20 ENCOUNTER — Encounter: Payer: Self-pay | Admitting: Pulmonary Disease

## 2014-11-20 VITALS — BP 128/72 | HR 69 | Ht 70.0 in | Wt 192.6 lb

## 2014-11-20 DIAGNOSIS — J449 Chronic obstructive pulmonary disease, unspecified: Secondary | ICD-10-CM

## 2014-11-20 DIAGNOSIS — J986 Disorders of diaphragm: Secondary | ICD-10-CM

## 2014-11-20 DIAGNOSIS — R0600 Dyspnea, unspecified: Secondary | ICD-10-CM

## 2014-11-20 MED ORDER — UMECLIDINIUM-VILANTEROL 62.5-25 MCG/INH IN AEPB: 1.0000 | INHALATION_SPRAY | Freq: Every day | RESPIRATORY_TRACT | Status: DC

## 2014-11-20 MED ORDER — UMECLIDINIUM-VILANTEROL 62.5-25 MCG/INH IN AEPB: 1.0000 | INHALATION_SPRAY | Freq: Every day | RESPIRATORY_TRACT | Status: AC

## 2014-11-20 NOTE — Patient Instructions (Signed)
Trial of Anoro - you may go on and off it to determine whether it is beneficial Continue albuterol inhaler as needed Follow up in 4-6 weeks

## 2014-11-21 NOTE — Progress Notes (Signed)
PULMONARY OFFICE FOLLOW UP NOTE  Initial consultation: 10/29/14 Reason for consultation: Dyspnea  Pt Profile:  10/29/14: initial [facility] pulm evaluation. Previously seen by Dr Raul Del of [place] [facility]. Carries diagnosis of COPD, Gold II  Multiple Xray exams revealing elevated L hemidiaphragm CT chest 11/21/12: no explanation for elevation of L diaphragm Sniff test 11/04/13: paradoxical L hemidiaphragm motion c/w paralysis  Report of PFTs performed @ [facility]: FVC was 2.83 liters, 75% of predicted/Post 2.84, 75%, 0% Change FEV1 was 1.82, 62% of predicted/Post 2.01, 68%, 10% Change FEV1 ratio was 64/Post 71 FEF 25-75% liters per second was 25% of predicted/Post 44%, 80% Change LUNG VOLUMES: TLC was 68% of predicted RV was 55% of predicted DIFFUSION CAPACITY: DLCO was 63% of predicted DLCO/VA was 111% of predicted   SUBJ:  Last visit I encouraged that Symbicort be used on a scheduled basis - 2 actuations BID and prescribed PRN albuterol MDI and recommended that it can be used in anticipation of exertion such as when he golfs or hunts. A CXR was performed and continued to show elevated L hemidiaphragm. He feels that he has benefited from more regular use of Symbicort but has developed hoarseness and dry cough. He has no new complaints or symptoms  OBJ: Filed Vitals:   11/20/14 1111  BP: 128/72  Pulse: 69  Height: 5\' 10"  (1.778 m)  Weight: 192 lb 9.6 oz (87.363 kg)  SpO2: 95%    Gen: WDWN in NAD HEENT: All WNL Neck: NO LAN, no JVD noted Lungs: full BS except in L lower chest, normal percussion note throughout, no adventitious sounds Cardiovascular: Reg rate, normal rhythm, no M noted Abdomen: Soft, NT +BS Ext: no C/C/E Neuro: CNs intact, motor/sens grossly intact Skin: No lesions noted   DATA: No new labs or Xrys  IMPRESSION: Idiopathic, chronic L hemidiaphragm paralysis - present since at least 2008 Chronic dyspnea - likely mostly due to diaphragm  dysfunction but symptomatically improved with Symibcort. Therefore, might also be a component of reversible airways disease Hoarseness and dry cough likely due to inhaled steroid  PLAN: We discussed the diagnosis of diaphragm paralysis and its implications. It appears that this is not reversible but I also emphasized that it is not likely to get worse DC Symbicort Anoro one inhalation daily Continue PRN albuterol  F/U in 4-6 weeks to assess response to Anoro   Wilhelmina Mcardle, MD [facility]/CCM

## 2014-12-19 ENCOUNTER — Ambulatory Visit (INDEPENDENT_AMBULATORY_CARE_PROVIDER_SITE_OTHER): Payer: Commercial Managed Care - HMO | Admitting: Pulmonary Disease

## 2014-12-19 ENCOUNTER — Encounter: Payer: Self-pay | Admitting: Pulmonary Disease

## 2014-12-19 VITALS — BP 128/74 | HR 65 | Ht 70.0 in | Wt 200.4 lb

## 2014-12-19 DIAGNOSIS — J449 Chronic obstructive pulmonary disease, unspecified: Secondary | ICD-10-CM | POA: Diagnosis not present

## 2014-12-19 DIAGNOSIS — J986 Disorders of diaphragm: Secondary | ICD-10-CM | POA: Diagnosis not present

## 2014-12-19 DIAGNOSIS — R0600 Dyspnea, unspecified: Secondary | ICD-10-CM | POA: Diagnosis not present

## 2014-12-19 NOTE — Progress Notes (Signed)
PULMONARY OFFICE FOLLOW UP NOTE  Initial consultation: 10/29/14 Reason for consultation: Dyspnea  Pt Profile:  10/29/14: initial [facility] pulm evaluation. Previously seen by Dr Raul Del of [place] [facility]. Carries diagnosis of COPD, Gold II  Multiple Xray exams revealing elevated L hemidiaphragm CT chest 11/21/12: no explanation for elevation of L diaphragm Sniff test 11/04/13: paradoxical L hemidiaphragm motion c/w paralysis  Report of PFTs performed @ [facility] FVC was 2.83 liters, 75%, FEV1 was 1.82, 62% of pred/Post 2.01, 68%, FEV1 ratio 64%, TLC was 68% of predicted, RV was 55% of predicted, DLCO was 63% of pred, DLCO/VA was 111% of pred  11/21/14: changed from Symbicort to Anoro  12/19/14: much improved with change to Anoro. Rarely using albuterol as he feels little need for it  SUBJ:  Much improved with change to Anoro. Rarely using albuterol as he feels little need for it. No new respiratory or pulmonary complaints  OBJ: Filed Vitals:   12/19/14 0955  BP: 128/74  Pulse: 65  Height: 5\' 10"  (1.778 m)  Weight: 200 lb 6.4 oz (90.901 kg)  SpO2: 96%    Gen: WDWN in NAD HEENT: All WNL Neck: NO LAN, no JVD noted Lungs: full BS, normal percussion note, no wheezes or otheradventitious sounds Cardiovascular: Reg rate, normal rhythm, no M noted Abdomen: Soft, NT +BS Ext: no C/C/E Neuro: CNs intact, motor/sens grossly intact   DATA: No new labs or Xrays  IMPRESSION: Idiopathic, chronic L hemidiaphragm paralysis - present since at least 2008 Chronic dyspnea - due to COPD and L diaphragm dysfunction. Hoarseness and dry cough likely due to inhaled steroid - resolved  PLAN: Cont Anoro one inhalation daily Continue PRN albuterol  F/U in 3 months At some point, he should probably get repeat PFTs   Wilhelmina Mcardle, MD [facility]/CCM

## 2015-01-06 ENCOUNTER — Other Ambulatory Visit: Payer: Self-pay

## 2015-01-06 MED ORDER — UMECLIDINIUM-VILANTEROL 62.5-25 MCG/INH IN AEPB: 1.0000 | INHALATION_SPRAY | Freq: Every day | RESPIRATORY_TRACT | Status: DC

## 2015-01-06 NOTE — Telephone Encounter (Signed)
Request 90 day 

## 2015-02-19 DIAGNOSIS — G250 Essential tremor: Secondary | ICD-10-CM | POA: Diagnosis not present

## 2015-03-03 ENCOUNTER — Ambulatory Visit: Payer: Commercial Managed Care - HMO | Admitting: Pulmonary Disease

## 2015-03-09 ENCOUNTER — Ambulatory Visit: Payer: Commercial Managed Care - HMO | Admitting: Pulmonary Disease

## 2015-03-23 DIAGNOSIS — R21 Rash and other nonspecific skin eruption: Secondary | ICD-10-CM | POA: Diagnosis not present

## 2015-03-23 DIAGNOSIS — F3341 Major depressive disorder, recurrent, in partial remission: Secondary | ICD-10-CM | POA: Diagnosis not present

## 2015-03-23 DIAGNOSIS — R0602 Shortness of breath: Secondary | ICD-10-CM | POA: Diagnosis not present

## 2015-03-23 DIAGNOSIS — L299 Pruritus, unspecified: Secondary | ICD-10-CM | POA: Diagnosis not present

## 2015-05-18 DIAGNOSIS — Z79899 Other long term (current) drug therapy: Secondary | ICD-10-CM | POA: Diagnosis not present

## 2015-05-18 DIAGNOSIS — I10 Essential (primary) hypertension: Secondary | ICD-10-CM | POA: Diagnosis not present

## 2015-05-18 DIAGNOSIS — G250 Essential tremor: Secondary | ICD-10-CM | POA: Diagnosis not present

## 2015-05-18 DIAGNOSIS — Z125 Encounter for screening for malignant neoplasm of prostate: Secondary | ICD-10-CM | POA: Diagnosis not present

## 2015-05-18 DIAGNOSIS — F419 Anxiety disorder, unspecified: Secondary | ICD-10-CM | POA: Diagnosis not present

## 2015-05-18 DIAGNOSIS — E7800 Pure hypercholesterolemia, unspecified: Secondary | ICD-10-CM | POA: Diagnosis not present

## 2015-05-18 DIAGNOSIS — J4520 Mild intermittent asthma, uncomplicated: Secondary | ICD-10-CM | POA: Diagnosis not present

## 2015-08-13 DIAGNOSIS — G250 Essential tremor: Secondary | ICD-10-CM | POA: Diagnosis not present

## 2015-10-22 DIAGNOSIS — L988 Other specified disorders of the skin and subcutaneous tissue: Secondary | ICD-10-CM | POA: Diagnosis not present

## 2015-10-22 DIAGNOSIS — Z08 Encounter for follow-up examination after completed treatment for malignant neoplasm: Secondary | ICD-10-CM | POA: Diagnosis not present

## 2015-10-22 DIAGNOSIS — L570 Actinic keratosis: Secondary | ICD-10-CM | POA: Diagnosis not present

## 2015-10-22 DIAGNOSIS — Z1283 Encounter for screening for malignant neoplasm of skin: Secondary | ICD-10-CM | POA: Diagnosis not present

## 2015-10-22 DIAGNOSIS — C44519 Basal cell carcinoma of skin of other part of trunk: Secondary | ICD-10-CM | POA: Diagnosis not present

## 2015-10-22 DIAGNOSIS — D485 Neoplasm of uncertain behavior of skin: Secondary | ICD-10-CM | POA: Diagnosis not present

## 2015-10-22 DIAGNOSIS — Z09 Encounter for follow-up examination after completed treatment for conditions other than malignant neoplasm: Secondary | ICD-10-CM | POA: Diagnosis not present

## 2015-10-22 DIAGNOSIS — Z85828 Personal history of other malignant neoplasm of skin: Secondary | ICD-10-CM | POA: Diagnosis not present

## 2015-10-22 DIAGNOSIS — Z872 Personal history of diseases of the skin and subcutaneous tissue: Secondary | ICD-10-CM | POA: Diagnosis not present

## 2015-11-03 DIAGNOSIS — I10 Essential (primary) hypertension: Secondary | ICD-10-CM | POA: Diagnosis not present

## 2015-11-03 DIAGNOSIS — Z79899 Other long term (current) drug therapy: Secondary | ICD-10-CM | POA: Diagnosis not present

## 2015-11-03 DIAGNOSIS — Z125 Encounter for screening for malignant neoplasm of prostate: Secondary | ICD-10-CM | POA: Diagnosis not present

## 2015-11-03 DIAGNOSIS — E7800 Pure hypercholesterolemia, unspecified: Secondary | ICD-10-CM | POA: Diagnosis not present

## 2015-11-04 DIAGNOSIS — C44519 Basal cell carcinoma of skin of other part of trunk: Secondary | ICD-10-CM | POA: Diagnosis not present

## 2015-11-10 DIAGNOSIS — J4520 Mild intermittent asthma, uncomplicated: Secondary | ICD-10-CM | POA: Diagnosis not present

## 2015-11-10 DIAGNOSIS — G250 Essential tremor: Secondary | ICD-10-CM | POA: Diagnosis not present

## 2015-11-10 DIAGNOSIS — Z0000 Encounter for general adult medical examination without abnormal findings: Secondary | ICD-10-CM | POA: Diagnosis not present

## 2015-11-10 DIAGNOSIS — E7800 Pure hypercholesterolemia, unspecified: Secondary | ICD-10-CM | POA: Diagnosis not present

## 2015-11-10 DIAGNOSIS — K635 Polyp of colon: Secondary | ICD-10-CM | POA: Diagnosis not present

## 2015-11-10 DIAGNOSIS — I10 Essential (primary) hypertension: Secondary | ICD-10-CM | POA: Diagnosis not present

## 2015-11-10 DIAGNOSIS — Z23 Encounter for immunization: Secondary | ICD-10-CM | POA: Diagnosis not present

## 2015-11-10 DIAGNOSIS — L03312 Cellulitis of back [any part except buttock]: Secondary | ICD-10-CM | POA: Diagnosis not present

## 2015-11-10 DIAGNOSIS — R0609 Other forms of dyspnea: Secondary | ICD-10-CM | POA: Diagnosis not present

## 2015-11-19 DIAGNOSIS — R0609 Other forms of dyspnea: Secondary | ICD-10-CM | POA: Diagnosis not present

## 2016-01-06 ENCOUNTER — Other Ambulatory Visit: Payer: Self-pay | Admitting: Pulmonary Disease

## 2016-01-13 DIAGNOSIS — Z86010 Personal history of colonic polyps: Secondary | ICD-10-CM | POA: Diagnosis not present

## 2016-02-01 ENCOUNTER — Other Ambulatory Visit: Payer: Self-pay | Admitting: Pulmonary Disease

## 2016-02-12 DIAGNOSIS — H02839 Dermatochalasis of unspecified eye, unspecified eyelid: Secondary | ICD-10-CM | POA: Diagnosis not present

## 2016-02-12 DIAGNOSIS — H18413 Arcus senilis, bilateral: Secondary | ICD-10-CM | POA: Diagnosis not present

## 2016-02-12 DIAGNOSIS — H2513 Age-related nuclear cataract, bilateral: Secondary | ICD-10-CM | POA: Diagnosis not present

## 2016-02-12 DIAGNOSIS — H25013 Cortical age-related cataract, bilateral: Secondary | ICD-10-CM | POA: Diagnosis not present

## 2016-02-12 DIAGNOSIS — H2511 Age-related nuclear cataract, right eye: Secondary | ICD-10-CM | POA: Diagnosis not present

## 2016-02-18 DIAGNOSIS — G250 Essential tremor: Secondary | ICD-10-CM | POA: Diagnosis not present

## 2016-02-22 ENCOUNTER — Encounter: Payer: Self-pay | Admitting: *Deleted

## 2016-03-09 ENCOUNTER — Other Ambulatory Visit: Payer: Self-pay | Admitting: *Deleted

## 2016-03-09 DIAGNOSIS — R69 Illness, unspecified: Secondary | ICD-10-CM | POA: Diagnosis not present

## 2016-03-09 MED ORDER — UMECLIDINIUM-VILANTEROL 62.5-25 MCG/INH IN AEPB
1.0000 | INHALATION_SPRAY | Freq: Every day | RESPIRATORY_TRACT | 11 refills | Status: DC
Start: 2016-03-09 — End: 2017-04-03

## 2016-03-18 ENCOUNTER — Encounter: Payer: Self-pay | Admitting: *Deleted

## 2016-03-21 ENCOUNTER — Ambulatory Visit: Payer: Medicare HMO | Admitting: Anesthesiology

## 2016-03-21 ENCOUNTER — Ambulatory Visit
Admission: RE | Admit: 2016-03-21 | Discharge: 2016-03-21 | Disposition: A | Discharge status: Home / Self Care | Payer: Medicare HMO | Source: Ambulatory Visit | Attending: Unknown Physician Specialty | Admitting: Unknown Physician Specialty

## 2016-03-21 ENCOUNTER — Encounter: Payer: Self-pay | Admitting: *Deleted

## 2016-03-21 ENCOUNTER — Encounter
Admission: RE | Disposition: A | Discharge status: Home / Self Care | Payer: Self-pay | Source: Ambulatory Visit | Attending: Unknown Physician Specialty

## 2016-03-21 DIAGNOSIS — Z86010 Personal history of colonic polyps: Secondary | ICD-10-CM | POA: Insufficient documentation

## 2016-03-21 DIAGNOSIS — K635 Polyp of colon: Secondary | ICD-10-CM | POA: Diagnosis not present

## 2016-03-21 DIAGNOSIS — D122 Benign neoplasm of ascending colon: Secondary | ICD-10-CM | POA: Insufficient documentation

## 2016-03-21 DIAGNOSIS — I10 Essential (primary) hypertension: Secondary | ICD-10-CM | POA: Diagnosis not present

## 2016-03-21 DIAGNOSIS — Z87891 Personal history of nicotine dependence: Secondary | ICD-10-CM | POA: Diagnosis not present

## 2016-03-21 DIAGNOSIS — F419 Anxiety disorder, unspecified: Secondary | ICD-10-CM | POA: Insufficient documentation

## 2016-03-21 DIAGNOSIS — K5790 Diverticulosis of intestine, part unspecified, without perforation or abscess without bleeding: Secondary | ICD-10-CM | POA: Diagnosis not present

## 2016-03-21 DIAGNOSIS — Z1211 Encounter for screening for malignant neoplasm of colon: Secondary | ICD-10-CM | POA: Diagnosis not present

## 2016-03-21 DIAGNOSIS — G709 Myoneural disorder, unspecified: Secondary | ICD-10-CM | POA: Insufficient documentation

## 2016-03-21 DIAGNOSIS — N400 Enlarged prostate without lower urinary tract symptoms: Secondary | ICD-10-CM | POA: Diagnosis not present

## 2016-03-21 DIAGNOSIS — Z85828 Personal history of other malignant neoplasm of skin: Secondary | ICD-10-CM | POA: Insufficient documentation

## 2016-03-21 DIAGNOSIS — G2581 Restless legs syndrome: Secondary | ICD-10-CM | POA: Insufficient documentation

## 2016-03-21 DIAGNOSIS — E785 Hyperlipidemia, unspecified: Secondary | ICD-10-CM | POA: Diagnosis not present

## 2016-03-21 DIAGNOSIS — G250 Essential tremor: Secondary | ICD-10-CM | POA: Diagnosis not present

## 2016-03-21 DIAGNOSIS — D120 Benign neoplasm of cecum: Secondary | ICD-10-CM | POA: Insufficient documentation

## 2016-03-21 DIAGNOSIS — K219 Gastro-esophageal reflux disease without esophagitis: Secondary | ICD-10-CM | POA: Insufficient documentation

## 2016-03-21 DIAGNOSIS — F329 Major depressive disorder, single episode, unspecified: Secondary | ICD-10-CM | POA: Insufficient documentation

## 2016-03-21 DIAGNOSIS — Z79899 Other long term (current) drug therapy: Secondary | ICD-10-CM | POA: Insufficient documentation

## 2016-03-21 DIAGNOSIS — K649 Unspecified hemorrhoids: Secondary | ICD-10-CM | POA: Diagnosis not present

## 2016-03-21 DIAGNOSIS — J449 Chronic obstructive pulmonary disease, unspecified: Secondary | ICD-10-CM | POA: Diagnosis not present

## 2016-03-21 HISTORY — DX: Other chest pain: R07.89

## 2016-03-21 HISTORY — DX: Anxiety disorder, unspecified: F41.9

## 2016-03-21 HISTORY — DX: Benign prostatic hyperplasia without lower urinary tract symptoms: N40.0

## 2016-03-21 HISTORY — DX: Polyp of colon: K63.5

## 2016-03-21 HISTORY — DX: Restless legs syndrome: G25.81

## 2016-03-21 HISTORY — PX: COLONOSCOPY WITH PROPOFOL: SHX5780

## 2016-03-21 HISTORY — DX: Depression, unspecified: F32.A

## 2016-03-21 HISTORY — DX: Other specified bacterial intestinal infections: A04.8

## 2016-03-21 HISTORY — DX: Hyperlipidemia, unspecified: E78.5

## 2016-03-21 HISTORY — DX: Major depressive disorder, single episode, unspecified: F32.9

## 2016-03-21 HISTORY — DX: Dorsalgia, unspecified: M54.9

## 2016-03-21 HISTORY — DX: Essential (primary) hypertension: I10

## 2016-03-21 SURGERY — COLONOSCOPY WITH PROPOFOL
Anesthesia: General

## 2016-03-21 MED ORDER — PROPOFOL 10 MG/ML IV BOLUS
INTRAVENOUS | Status: AC
  Filled 2016-03-21: qty 20

## 2016-03-21 MED ORDER — FENTANYL CITRATE (PF) 100 MCG/2ML IJ SOLN
INTRAMUSCULAR | Status: AC
  Filled 2016-03-21: qty 2

## 2016-03-21 MED ORDER — FENTANYL CITRATE (PF) 100 MCG/2ML IJ SOLN
INTRAMUSCULAR | Status: DC | PRN
  Administered 2016-03-21 (×2): 50 ug via INTRAVENOUS

## 2016-03-21 MED ORDER — SODIUM CHLORIDE 0.9 % IV SOLN: INTRAVENOUS | Status: DC

## 2016-03-21 MED ORDER — MIDAZOLAM HCL 2 MG/2ML IJ SOLN
INTRAMUSCULAR | Status: AC
  Filled 2016-03-21: qty 2

## 2016-03-21 MED ORDER — PROPOFOL 10 MG/ML IV BOLUS
INTRAVENOUS | Status: DC | PRN
  Administered 2016-03-21: 20 mg via INTRAVENOUS
  Administered 2016-03-21: 30 mg via INTRAVENOUS

## 2016-03-21 MED ORDER — LIDOCAINE HCL (PF) 2 % IJ SOLN
INTRAMUSCULAR | Status: AC
  Filled 2016-03-21: qty 2

## 2016-03-21 MED ORDER — SODIUM CHLORIDE 0.9 % IV SOLN
INTRAVENOUS | Status: DC
  Administered 2016-03-21: 11:00:00 via INTRAVENOUS

## 2016-03-21 MED ORDER — LIDOCAINE HCL (PF) 2 % IJ SOLN
INTRAMUSCULAR | Status: DC | PRN
  Administered 2016-03-21: 50 mg
  Administered 2016-03-21: 20 mg

## 2016-03-21 MED ORDER — METHYLENE BLUE 0.5 % INJ SOLN
INTRAVENOUS | Status: AC
  Filled 2016-03-21: qty 10

## 2016-03-21 MED ORDER — MIDAZOLAM HCL 5 MG/5ML IJ SOLN
INTRAMUSCULAR | Status: DC | PRN
  Administered 2016-03-21 (×2): 1 mg via INTRAVENOUS

## 2016-03-21 MED ORDER — PROPOFOL 500 MG/50ML IV EMUL
INTRAVENOUS | Status: DC | PRN
  Administered 2016-03-21: 50 ug/kg/min via INTRAVENOUS

## 2016-03-21 NOTE — Anesthesia Preprocedure Evaluation (Signed)
Anesthesia Evaluation  Patient identified by MRN, date of birth, ID band Patient awake    Reviewed: Allergy & Precautions, NPO status , Patient's Chart, lab work & pertinent test results, reviewed documented beta blocker date and time   Airway Mallampati: II  TM Distance: >3 FB     Dental  (+) Chipped   Pulmonary COPD, former smoker,           Cardiovascular hypertension, Pt. on medications and Pt. on home beta blockers      Neuro/Psych PSYCHIATRIC DISORDERS Anxiety Depression  Neuromuscular disease    GI/Hepatic GERD  Controlled,  Endo/Other    Renal/GU      Musculoskeletal   Abdominal   Peds  Hematology   Anesthesia Other Findings   Reproductive/Obstetrics                             Anesthesia Physical Anesthesia Plan  ASA: III  Anesthesia Plan: General   Post-op Pain Management:    Induction: Intravenous  Airway Management Planned: Nasal Cannula  Additional Equipment:   Intra-op Plan:   Post-operative Plan:   Informed Consent: I have reviewed the patients History and Physical, chart, labs and discussed the procedure including the risks, benefits and alternatives for the proposed anesthesia with the patient or authorized representative who has indicated his/her understanding and acceptance.     Plan Discussed with: CRNA  Anesthesia Plan Comments:         Anesthesia Quick Evaluation

## 2016-03-21 NOTE — Transfer of Care (Signed)
Immediate Anesthesia Transfer of Care Note  Patient: Wesley Preston  Procedure(s) Performed: Procedure(s): COLONOSCOPY WITH PROPOFOL (N/A)  Patient Location: PACU  Anesthesia Type:General  Level of Consciousness: sedated  Airway & Oxygen Therapy: Patient Spontanous Breathing and Patient connected to nasal cannula oxygen  Post-op Assessment: Report given to RN and Post -op Vital signs reviewed and stable  Post vital signs: Reviewed and stable  Last Vitals:  Vitals:   03/21/16 1042  BP: 136/84  Pulse: 76  Resp: 16  Temp: 36.4 C    Last Pain:  Vitals:   03/21/16 1042  TempSrc: Tympanic         Complications: No apparent anesthesia complications

## 2016-03-21 NOTE — Op Note (Signed)
[facility] Patient Name: Wesley Preston Procedure Date: 03/21/2016 11:07 AM MRN: [id number] Account #: [id number] Date of Birth: 19-Mar-1940 Admit Type: Outpatient Age: 76 Room: [facility] ENDO ROOM 1 Gender: Male Note Status: Finalized Procedure:            Colonoscopy Indications:          High risk colon cancer surveillance: Personal history                        of colonic polyps Providers:            Manya Silvas, MD Referring MD:         Leonie Douglas. Doy Hutching, MD (Referring MD) Medicines:            Propofol per Anesthesia Complications:        No immediate complications. Procedure:            Pre-Anesthesia Assessment:                       - After reviewing the risks and benefits, the patient                        was deemed in satisfactory condition to undergo the                        procedure.                       After obtaining informed consent, the colonoscope was                        passed under direct vision. Throughout the procedure,                        the patient's blood pressure, pulse, and oxygen                        saturations were monitored continuously. The                        Colonoscope was introduced through the anus and                        advanced to the the cecum, identified by appendiceal                        orifice and ileocecal valve. The colonoscopy was                        extremely difficult due to abnormal anatomy. The                        patient tolerated the procedure well. The quality of                        the bowel preparation was good. Findings:      A medium polyp was found in the cecum. The polyp was sessile. The polyp       was removed with a hot snare. Resection and retrieval were complete. To       close a defect after mucosal resection, three  hemostatic clips were       successfully placed. There was no bleeding at the end of the procedure.      A small polyp was found in  the ascending colon. The polyp was sessile.       The polyp was removed with a hot snare. Resection and retrieval were       complete.      A 30 mm polyp was found in the proximal ascending colon. The polyp was       sessile. The polyp was removed with a hot snare. Polyp resection was       incomplete, and the resected tissue was partially retrieved. Saline lift       done and about 80% was removed but small area was flat and a difficult       angle prevented removal of this small polyp. Part of the polyp was on       the back side of a fold making removal difficult. Impression:           - One medium polyp in the cecum, removed with a hot                        snare. Resected and retrieved. Clips were placed.                       - One small polyp in the ascending colon, removed with                        a hot snare. Resected and retrieved.                       - One 30 mm polyp in the proximal ascending colon,                        removed with a hot snare. Polyp resection was                        incomplete, and the resected tissue was partially                        retrieved. Recommendation:       - Await pathology results. Repeat in 3 months.                       No blood thinners for 10 days, No nuts seeds or popcorn                        10 days. Manya Silvas, MD 03/21/2016 12:53:53 PM This report has been signed electronically. Number of Addenda: 0 Note Initiated On: 03/21/2016 11:07 AM Scope Withdrawal Time: 1 hour 17 minutes 52 seconds  Total Procedure Duration: 1 hour 30 minutes 19 seconds       [facility]

## 2016-03-21 NOTE — Anesthesia Postprocedure Evaluation (Signed)
Anesthesia Post Note  Patient: Wesley Preston  Procedure(s) Performed: Procedure(s) (LRB): COLONOSCOPY WITH PROPOFOL (N/A)  Patient location during evaluation: Endoscopy Anesthesia Type: General Level of consciousness: awake and alert Pain management: pain level controlled Vital Signs Assessment: post-procedure vital signs reviewed and stable Respiratory status: spontaneous breathing, nonlabored ventilation, respiratory function stable and patient connected to nasal cannula oxygen Cardiovascular status: blood pressure returned to baseline and stable Postop Assessment: no signs of nausea or vomiting Anesthetic complications: no     Last Vitals:  Vitals:   03/21/16 1042 03/21/16 1251  BP: 136/84 (!) 98/57  Pulse: 76 73  Resp: 16 17  Temp: 36.4 C (!) 35.8 C    Last Pain:  Vitals:   03/21/16 1251  TempSrc: Tympanic                 Margart Zemanek S

## 2016-03-21 NOTE — H&P (Signed)
Primary Care Physician:  Idelle Crouch, MD Primary Gastroenterologist:  Dr. Vira Agar  Pre-Procedure History & Physical: HPI:  Wesley Preston is a 76 y.o. male is here for an colonoscopy.   Past Medical History:  Diagnosis Date  . Anxiety   . Back pain   . BPH (benign prostatic hyperplasia)   . Cancer (HCC)    BASAL CELL  . Chest pain, non-cardiac   . Colon polyps   . COPD (chronic obstructive pulmonary disease) ([place])   . Depression   . Essential tremor   . Essential tremor   . Family history of adverse reaction to anesthesia    DAUGHTERS GET NAUSEATED  . GERD (gastroesophageal reflux disease)    H/O  . Helicobacter pylori (H. pylori) infection   . Hyperlipidemia   . Hypertension   . Neuromuscular disorder ([place])    TREMORS  . Reactive airway disease   . Reactive airway disease   . Restless leg syndrome     Past Surgical History:  Procedure Laterality Date  . COLONOSCOPY  2013  . ESOPHAGOGASTRODUODENOSCOPY    . INGUINAL HERNIA REPAIR Right 08/01/2014   Procedure: RIGHT INGUINAL HERNIA  REPAIR WITH MESH ;  Surgeon: Robert Bellow, MD;  Location: [facility] ORS;  Service: General;  Laterality: Right; with large Ultra Pro mesh  . NASAL SINUS SURGERY    . ROTATOR CUFF REPAIR    . TRIGGER FINGER RELEASE      Prior to Admission medications   Medication Sig Start Date End Date Taking? Authorizing Provider  doxazosin (CARDURA) 4 MG tablet  04/26/14  Yes Historical Provider, MD  gabapentin (NEURONTIN) 100 MG capsule TAKE 1 CAPSULE (100 MG TOTAL) BY MOUTH 3 (THREE) TIMES DAILY. 06/29/14  Yes Historical Provider, MD  propranolol ER (INDERAL LA) 60 MG 24 hr capsule Take 60 mg by mouth daily.  05/28/14 03/21/16 Yes Historical Provider, MD  umeclidinium-vilanterol (ANORO ELLIPTA) 62.5-25 MCG/INH AEPB Inhale 1 puff into the lungs daily. 03/09/16  Yes Wilhelmina Mcardle, MD  Docusate Calcium (STOOL SOFTENER PO) daily.  06/04/14   Historical Provider, MD  metoCLOPramide (REGLAN) 10 MG tablet  Take by mouth. 10/10/14   Historical Provider, MD  NASACORT ALLERGY 24HR 55 MCG/ACT AERO nasal inhaler  12/05/14   Historical Provider, MD  VENTOLIN HFA 108 (90 Base) MCG/ACT inhaler INHALE 2 PUFFS INTO THE LUNGS EVERY 6 (SIX) HOURS AS NEEDED FOR WHEEZING OR SHORTNESS OF BREATH. 02/02/16   Wilhelmina Mcardle, MD    Allergies as of 02/08/2016  . (No Known Allergies)    Family History  Problem Relation Age of Onset  . Heart disease Father     Social History   Social History  . Marital status: Married    Spouse name: N/A  . Number of children: N/A  . Years of education: N/A   Occupational History  . Not on file.   Social History Main Topics  . Smoking status: Former Smoker    Packs/day: 1.00    Years: 20.00    Types: Cigarettes    Quit date: 07/16/1994  . Smokeless tobacco: Never Used  . Alcohol use 14.4 oz/week    24 Standard drinks or equivalent per week     Comment: WINE QHS  . Drug use: No  . Sexual activity: Not on file   Other Topics Concern  . Not on file   Social History Narrative  . No narrative on file    Review of Systems: See HPI, otherwise  negative ROS  Physical Exam: BP 136/84   Pulse 76   Temp 97.5 F (36.4 C) (Tympanic)   Resp 16   Ht 5\' 10"  (1.778 m)   Wt 88.9 kg (196 lb)   SpO2 97%   BMI 28.12 kg/m  General:   Alert,  pleasant and cooperative in NAD Head:  Normocephalic and atraumatic. Neck:  Supple; no masses or thyromegaly. Lungs:  Clear throughout to auscultation.    Heart:  Regular rate and rhythm. Abdomen:  Soft, nontender and nondistended. Normal bowel sounds, without guarding, and without rebound.   Neurologic:  Alert and  oriented x4;  grossly normal neurologically.  Impression/Plan: Wesley Preston is here for an colonoscopy to be performed for [facility] colon polyps  Risks, benefits, limitations, and alternatives regarding  colonoscopy have been reviewed with the patient.  Questions have been answered.  All parties  agreeable.   Gaylyn Cheers, MD  03/21/2016, 11:04 AM

## 2016-03-21 NOTE — Anesthesia Post-op Follow-up Note (Cosign Needed)
Anesthesia QCDR form completed.        

## 2016-03-22 ENCOUNTER — Encounter: Payer: Self-pay | Admitting: Unknown Physician Specialty

## 2016-03-22 LAB — SURGICAL PATHOLOGY

## 2016-04-18 DIAGNOSIS — H2513 Age-related nuclear cataract, bilateral: Secondary | ICD-10-CM | POA: Diagnosis not present

## 2016-04-18 DIAGNOSIS — H2511 Age-related nuclear cataract, right eye: Secondary | ICD-10-CM | POA: Diagnosis not present

## 2016-04-18 DIAGNOSIS — H52201 Unspecified astigmatism, right eye: Secondary | ICD-10-CM | POA: Diagnosis not present

## 2016-05-04 DIAGNOSIS — Z872 Personal history of diseases of the skin and subcutaneous tissue: Secondary | ICD-10-CM | POA: Diagnosis not present

## 2016-05-04 DIAGNOSIS — L570 Actinic keratosis: Secondary | ICD-10-CM | POA: Diagnosis not present

## 2016-05-04 DIAGNOSIS — Z85828 Personal history of other malignant neoplasm of skin: Secondary | ICD-10-CM | POA: Diagnosis not present

## 2016-05-04 DIAGNOSIS — L723 Sebaceous cyst: Secondary | ICD-10-CM | POA: Diagnosis not present

## 2016-05-04 DIAGNOSIS — L0889 Other specified local infections of the skin and subcutaneous tissue: Secondary | ICD-10-CM | POA: Diagnosis not present

## 2016-05-04 DIAGNOSIS — D485 Neoplasm of uncertain behavior of skin: Secondary | ICD-10-CM | POA: Diagnosis not present

## 2016-05-09 DIAGNOSIS — R269 Unspecified abnormalities of gait and mobility: Secondary | ICD-10-CM | POA: Diagnosis not present

## 2016-05-09 DIAGNOSIS — H539 Unspecified visual disturbance: Secondary | ICD-10-CM | POA: Diagnosis not present

## 2016-05-09 DIAGNOSIS — F3341 Major depressive disorder, recurrent, in partial remission: Secondary | ICD-10-CM | POA: Diagnosis not present

## 2016-05-09 DIAGNOSIS — E7800 Pure hypercholesterolemia, unspecified: Secondary | ICD-10-CM | POA: Diagnosis not present

## 2016-05-09 DIAGNOSIS — Z79899 Other long term (current) drug therapy: Secondary | ICD-10-CM | POA: Diagnosis not present

## 2016-05-09 DIAGNOSIS — F419 Anxiety disorder, unspecified: Secondary | ICD-10-CM | POA: Diagnosis not present

## 2016-05-09 DIAGNOSIS — I10 Essential (primary) hypertension: Secondary | ICD-10-CM | POA: Diagnosis not present

## 2016-05-10 ENCOUNTER — Other Ambulatory Visit: Payer: Self-pay | Admitting: Internal Medicine

## 2016-05-10 DIAGNOSIS — H539 Unspecified visual disturbance: Secondary | ICD-10-CM

## 2016-05-10 DIAGNOSIS — R269 Unspecified abnormalities of gait and mobility: Secondary | ICD-10-CM

## 2016-05-13 DIAGNOSIS — M6281 Muscle weakness (generalized): Secondary | ICD-10-CM | POA: Diagnosis not present

## 2016-05-13 DIAGNOSIS — R262 Difficulty in walking, not elsewhere classified: Secondary | ICD-10-CM | POA: Diagnosis not present

## 2016-05-19 DIAGNOSIS — E7800 Pure hypercholesterolemia, unspecified: Secondary | ICD-10-CM | POA: Diagnosis not present

## 2016-05-19 DIAGNOSIS — Z79899 Other long term (current) drug therapy: Secondary | ICD-10-CM | POA: Diagnosis not present

## 2016-05-19 DIAGNOSIS — I10 Essential (primary) hypertension: Secondary | ICD-10-CM | POA: Diagnosis not present

## 2016-05-19 DIAGNOSIS — R269 Unspecified abnormalities of gait and mobility: Secondary | ICD-10-CM | POA: Diagnosis not present

## 2016-05-19 DIAGNOSIS — H539 Unspecified visual disturbance: Secondary | ICD-10-CM | POA: Diagnosis not present

## 2016-05-23 DIAGNOSIS — R7989 Other specified abnormal findings of blood chemistry: Secondary | ICD-10-CM | POA: Diagnosis not present

## 2016-05-23 DIAGNOSIS — E538 Deficiency of other specified B group vitamins: Secondary | ICD-10-CM | POA: Diagnosis not present

## 2016-05-24 DIAGNOSIS — R262 Difficulty in walking, not elsewhere classified: Secondary | ICD-10-CM | POA: Diagnosis not present

## 2016-05-24 DIAGNOSIS — M6281 Muscle weakness (generalized): Secondary | ICD-10-CM | POA: Diagnosis not present

## 2016-05-26 DIAGNOSIS — M6281 Muscle weakness (generalized): Secondary | ICD-10-CM | POA: Diagnosis not present

## 2016-05-26 DIAGNOSIS — R262 Difficulty in walking, not elsewhere classified: Secondary | ICD-10-CM | POA: Diagnosis not present

## 2016-06-01 DIAGNOSIS — R7989 Other specified abnormal findings of blood chemistry: Secondary | ICD-10-CM | POA: Diagnosis not present

## 2016-06-01 DIAGNOSIS — E538 Deficiency of other specified B group vitamins: Secondary | ICD-10-CM | POA: Diagnosis not present

## 2016-06-02 ENCOUNTER — Ambulatory Visit
Admission: RE | Admit: 2016-06-02 | Discharge: 2016-06-02 | Disposition: A | Discharge status: Home / Self Care | Payer: Medicare HMO | Source: Ambulatory Visit | Attending: Internal Medicine | Admitting: Internal Medicine

## 2016-06-02 DIAGNOSIS — I739 Peripheral vascular disease, unspecified: Secondary | ICD-10-CM | POA: Insufficient documentation

## 2016-06-02 DIAGNOSIS — H539 Unspecified visual disturbance: Secondary | ICD-10-CM | POA: Diagnosis not present

## 2016-06-02 DIAGNOSIS — G319 Degenerative disease of nervous system, unspecified: Secondary | ICD-10-CM | POA: Diagnosis not present

## 2016-06-02 DIAGNOSIS — I639 Cerebral infarction, unspecified: Secondary | ICD-10-CM | POA: Insufficient documentation

## 2016-06-02 DIAGNOSIS — J324 Chronic pansinusitis: Secondary | ICD-10-CM | POA: Insufficient documentation

## 2016-06-02 DIAGNOSIS — R269 Unspecified abnormalities of gait and mobility: Secondary | ICD-10-CM | POA: Insufficient documentation

## 2016-06-02 DIAGNOSIS — R42 Dizziness and giddiness: Secondary | ICD-10-CM | POA: Diagnosis not present

## 2016-06-06 DIAGNOSIS — E538 Deficiency of other specified B group vitamins: Secondary | ICD-10-CM | POA: Diagnosis not present

## 2016-06-06 DIAGNOSIS — R7989 Other specified abnormal findings of blood chemistry: Secondary | ICD-10-CM | POA: Diagnosis not present

## 2016-06-09 ENCOUNTER — Other Ambulatory Visit: Payer: Self-pay | Admitting: Internal Medicine

## 2016-06-09 DIAGNOSIS — I639 Cerebral infarction, unspecified: Secondary | ICD-10-CM

## 2016-06-09 DIAGNOSIS — E7800 Pure hypercholesterolemia, unspecified: Secondary | ICD-10-CM | POA: Diagnosis not present

## 2016-06-09 DIAGNOSIS — I10 Essential (primary) hypertension: Secondary | ICD-10-CM | POA: Diagnosis not present

## 2016-06-10 ENCOUNTER — Ambulatory Visit
Admission: RE | Admit: 2016-06-10 | Discharge: 2016-06-10 | Disposition: A | Discharge status: Home / Self Care | Payer: Medicare HMO | Source: Ambulatory Visit | Attending: Internal Medicine | Admitting: Internal Medicine

## 2016-06-10 DIAGNOSIS — I63233 Cerebral infarction due to unspecified occlusion or stenosis of bilateral carotid arteries: Secondary | ICD-10-CM | POA: Diagnosis not present

## 2016-06-10 DIAGNOSIS — I6523 Occlusion and stenosis of bilateral carotid arteries: Secondary | ICD-10-CM | POA: Diagnosis not present

## 2016-06-10 DIAGNOSIS — I639 Cerebral infarction, unspecified: Secondary | ICD-10-CM | POA: Diagnosis not present

## 2016-06-20 DIAGNOSIS — R7989 Other specified abnormal findings of blood chemistry: Secondary | ICD-10-CM | POA: Diagnosis not present

## 2016-06-20 DIAGNOSIS — E538 Deficiency of other specified B group vitamins: Secondary | ICD-10-CM | POA: Diagnosis not present

## 2016-07-14 DIAGNOSIS — R2689 Other abnormalities of gait and mobility: Secondary | ICD-10-CM | POA: Diagnosis not present

## 2016-07-14 DIAGNOSIS — H539 Unspecified visual disturbance: Secondary | ICD-10-CM | POA: Diagnosis not present

## 2016-07-14 DIAGNOSIS — R251 Tremor, unspecified: Secondary | ICD-10-CM | POA: Diagnosis not present

## 2016-07-14 DIAGNOSIS — R42 Dizziness and giddiness: Secondary | ICD-10-CM | POA: Diagnosis not present

## 2016-07-18 DIAGNOSIS — H539 Unspecified visual disturbance: Secondary | ICD-10-CM | POA: Insufficient documentation

## 2016-07-18 DIAGNOSIS — R2689 Other abnormalities of gait and mobility: Secondary | ICD-10-CM | POA: Insufficient documentation

## 2016-07-18 DIAGNOSIS — R251 Tremor, unspecified: Secondary | ICD-10-CM | POA: Insufficient documentation

## 2016-07-18 DIAGNOSIS — R42 Dizziness and giddiness: Secondary | ICD-10-CM | POA: Insufficient documentation

## 2016-07-25 DIAGNOSIS — E538 Deficiency of other specified B group vitamins: Secondary | ICD-10-CM | POA: Diagnosis not present

## 2016-07-25 DIAGNOSIS — R7989 Other specified abnormal findings of blood chemistry: Secondary | ICD-10-CM | POA: Diagnosis not present

## 2016-08-01 ENCOUNTER — Ambulatory Visit: Admit: 2016-08-01 | Payer: Medicare HMO | Admitting: Unknown Physician Specialty

## 2016-08-01 SURGERY — COLONOSCOPY WITH PROPOFOL
Anesthesia: General

## 2016-09-01 DIAGNOSIS — E538 Deficiency of other specified B group vitamins: Secondary | ICD-10-CM | POA: Diagnosis not present

## 2016-10-05 DIAGNOSIS — E538 Deficiency of other specified B group vitamins: Secondary | ICD-10-CM | POA: Diagnosis not present

## 2016-10-05 DIAGNOSIS — Z23 Encounter for immunization: Secondary | ICD-10-CM | POA: Diagnosis not present

## 2016-11-14 DIAGNOSIS — Z85828 Personal history of other malignant neoplasm of skin: Secondary | ICD-10-CM | POA: Diagnosis not present

## 2016-11-14 DIAGNOSIS — L578 Other skin changes due to chronic exposure to nonionizing radiation: Secondary | ICD-10-CM | POA: Diagnosis not present

## 2016-11-14 DIAGNOSIS — L570 Actinic keratosis: Secondary | ICD-10-CM | POA: Diagnosis not present

## 2016-11-15 DIAGNOSIS — Z125 Encounter for screening for malignant neoplasm of prostate: Secondary | ICD-10-CM | POA: Diagnosis not present

## 2016-11-15 DIAGNOSIS — E7800 Pure hypercholesterolemia, unspecified: Secondary | ICD-10-CM | POA: Diagnosis not present

## 2016-11-15 DIAGNOSIS — Z79899 Other long term (current) drug therapy: Secondary | ICD-10-CM | POA: Diagnosis not present

## 2016-11-15 DIAGNOSIS — E538 Deficiency of other specified B group vitamins: Secondary | ICD-10-CM | POA: Diagnosis not present

## 2016-11-17 DIAGNOSIS — Z79899 Other long term (current) drug therapy: Secondary | ICD-10-CM | POA: Diagnosis not present

## 2016-11-17 DIAGNOSIS — Z0000 Encounter for general adult medical examination without abnormal findings: Secondary | ICD-10-CM | POA: Diagnosis not present

## 2016-11-17 DIAGNOSIS — I10 Essential (primary) hypertension: Secondary | ICD-10-CM | POA: Diagnosis not present

## 2016-11-17 DIAGNOSIS — R251 Tremor, unspecified: Secondary | ICD-10-CM | POA: Diagnosis not present

## 2016-11-17 DIAGNOSIS — G250 Essential tremor: Secondary | ICD-10-CM | POA: Diagnosis not present

## 2016-11-17 DIAGNOSIS — E7800 Pure hypercholesterolemia, unspecified: Secondary | ICD-10-CM | POA: Diagnosis not present

## 2016-11-17 DIAGNOSIS — E538 Deficiency of other specified B group vitamins: Secondary | ICD-10-CM | POA: Diagnosis not present

## 2016-11-17 DIAGNOSIS — Z1211 Encounter for screening for malignant neoplasm of colon: Secondary | ICD-10-CM | POA: Diagnosis not present

## 2016-12-15 DIAGNOSIS — R2689 Other abnormalities of gait and mobility: Secondary | ICD-10-CM | POA: Diagnosis not present

## 2016-12-15 DIAGNOSIS — R251 Tremor, unspecified: Secondary | ICD-10-CM | POA: Diagnosis not present

## 2016-12-15 DIAGNOSIS — R42 Dizziness and giddiness: Secondary | ICD-10-CM | POA: Diagnosis not present

## 2016-12-19 DIAGNOSIS — E538 Deficiency of other specified B group vitamins: Secondary | ICD-10-CM | POA: Diagnosis not present

## 2017-01-25 DIAGNOSIS — E538 Deficiency of other specified B group vitamins: Secondary | ICD-10-CM | POA: Diagnosis not present

## 2017-02-16 DIAGNOSIS — R2689 Other abnormalities of gait and mobility: Secondary | ICD-10-CM | POA: Diagnosis not present

## 2017-02-16 DIAGNOSIS — H539 Unspecified visual disturbance: Secondary | ICD-10-CM | POA: Diagnosis not present

## 2017-02-16 DIAGNOSIS — R251 Tremor, unspecified: Secondary | ICD-10-CM | POA: Diagnosis not present

## 2017-02-16 DIAGNOSIS — R42 Dizziness and giddiness: Secondary | ICD-10-CM | POA: Diagnosis not present

## 2017-02-27 DIAGNOSIS — E538 Deficiency of other specified B group vitamins: Secondary | ICD-10-CM | POA: Diagnosis not present

## 2017-03-08 ENCOUNTER — Other Ambulatory Visit: Payer: Self-pay | Admitting: Pulmonary Disease

## 2017-03-30 DIAGNOSIS — E538 Deficiency of other specified B group vitamins: Secondary | ICD-10-CM | POA: Diagnosis not present

## 2017-03-31 ENCOUNTER — Encounter: Payer: Self-pay | Admitting: Pulmonary Disease

## 2017-03-31 ENCOUNTER — Ambulatory Visit: Payer: Medicare HMO | Admitting: Pulmonary Disease

## 2017-03-31 ENCOUNTER — Other Ambulatory Visit
Admission: RE | Admit: 2017-03-31 | Discharge: 2017-03-31 | Disposition: A | Discharge status: Home / Self Care | Payer: Medicare HMO | Source: Ambulatory Visit | Attending: Pulmonary Disease | Admitting: Pulmonary Disease

## 2017-03-31 VITALS — BP 144/80 | HR 71 | Ht 70.0 in | Wt 197.0 lb

## 2017-03-31 DIAGNOSIS — H1011 Acute atopic conjunctivitis, right eye: Secondary | ICD-10-CM | POA: Diagnosis not present

## 2017-03-31 DIAGNOSIS — H35363 Drusen (degenerative) of macula, bilateral: Secondary | ICD-10-CM | POA: Diagnosis not present

## 2017-03-31 DIAGNOSIS — R0609 Other forms of dyspnea: Secondary | ICD-10-CM

## 2017-03-31 DIAGNOSIS — R0601 Orthopnea: Secondary | ICD-10-CM | POA: Insufficient documentation

## 2017-03-31 DIAGNOSIS — Z961 Presence of intraocular lens: Secondary | ICD-10-CM | POA: Diagnosis not present

## 2017-03-31 DIAGNOSIS — J986 Disorders of diaphragm: Secondary | ICD-10-CM

## 2017-03-31 DIAGNOSIS — J449 Chronic obstructive pulmonary disease, unspecified: Secondary | ICD-10-CM

## 2017-03-31 LAB — BRAIN NATRIURETIC PEPTIDE: B Natriuretic Peptide: 129 pg/mL — ABNORMAL HIGH (ref 0.0–100.0)

## 2017-03-31 NOTE — Addendum Note (Signed)
Addended by: Santiago Bur on: 03/31/2017 10:00 AM   Modules accepted: Orders

## 2017-03-31 NOTE — Progress Notes (Signed)
PULMONARY OFFICE FOLLOW UP NOTE  Initial consultation: 10/29/14 Reason for consultation: Dyspnea  Pt Profile:  77 y.o. former (remote) smoker previously seen by Dr Raul Del with diagnosis of COPD, Gold II and chronic L hemidiaphragm dysfunction of unclear etiology  DATA: Multiple Xray exams revealing elevated L hemidiaphragm CT chest 11/21/12: no explanation for elevation of L diaphragm Sniff test 11/04/13: paradoxical L hemidiaphragm motion c/w paralysis PFTs (performed @ [facility]):  FVC 2.83 liters, 75%, FEV1 1.82, 62% of pred/Post 2.01, 68%, FEV1/FVC 64%, TLC 68% of predicted, RV 55% of predicted, DLCO 63% of pred, DLCO/VA 111% of pred  INTERVAL: No major pulmonary events  SUBJ:  Last seen 12/2014.  Returns today for routine reevaluation.  It seems that this is mostly because he has been pressured by his family due to worsening exertional dyspnea.  The patient reports minimal change in his respiratory status.  However, his wife indicates that his exertional tolerance is progressively worsening since last visit.  He does admit to orthopnea.  He also has significant nasal congestion and sinus symptoms for which she is being seen by ENT.  He denies chest pain, significant cough, hemoptysis, lower extremity edema, calf tenderness.  He continues to use Anoro inhaler daily and continues to believe it has been beneficial.  He rarely uses his albuterol rescue inhaler.  OBJ: Vitals:   03/31/17 0825 03/31/17 0831  BP:  (!) 144/80  Pulse:  71  SpO2:  95%  Weight: 197 lb (89.4 kg)   Height: 5\' 10"  (1.778 m)     Gen: NAD HEENT: NCAT, sclerae white, oropharynx normal Neck: No LAN, no JVD Lungs: Breath sounds are absent in the left base approximately 1/4 up, no adventitious sounds Cardiovascular: Regular, soft systolic murmur Abdomen: Soft, NT, + BS Ext: No clubbing, cyanosis, edema Neuro: No focal deficits   DATA: A chest x-ray was performed at [facility] in November.  This is not  presently available for my review  IMPRESSION: Former smoker Mild COPD Chronic left hemidiaphragm paralysis of unclear etiology Gradually progressive exertional dyspnea Orthopnea -likely explained by diaphragmatic paralysis Chronic dyspnea - due to COPD and L diaphragm dysfunction.  PLAN: BNP ordered for today and evaluation of orthopnea I will track down his recent chest x-ray for my review Repeat pulmonary function tests ordered Referral to pulmonary rehab Continue Anoro inhaler daily Continue albuterol rescue inhaler as needed.  Encouraged more liberal use Follow-up in 4-6 weeks to review chest x-ray and pulmonary function tests   Merton Border, MD PCCM service Mobile [phone] Pager [phone] 03/31/2017 9:54 AM

## 2017-03-31 NOTE — Patient Instructions (Addendum)
BNP (blood tests) ordered for today I will track down your recent chest x-ray for my review and we will discuss at next visit Repeat lung function tests (PFTs) ordered Referral to pulmonary rehabilitation Continue Anoro inhaler Continue albuterol rescue inhaler as needed (you may use more liberally as we discussed) Follow-up in 4-6 weeks

## 2017-04-03 ENCOUNTER — Telehealth: Payer: Self-pay | Admitting: Pulmonary Disease

## 2017-04-03 MED ORDER — UMECLIDINIUM-VILANTEROL 62.5-25 MCG/INH IN AEPB: 1.0000 | INHALATION_SPRAY | Freq: Every day | RESPIRATORY_TRACT | 2 refills | Status: DC

## 2017-04-03 NOTE — Telephone Encounter (Signed)
Pt calling stating he has a PFT scheduled on 05/02/17  But he is now been summoned to [place] duty Would like a call back to reschedule this.     *STAT* If patient is at the pharmacy, call can be transferred to refill team.   1. Which medications need to be refilled? (please list name of each medication and dose if known) Anoro   2. Which pharmacy/location (including street and city if local pharmacy) is medication to be sent to? [facility] mail order   3. Do they need a 30 day or 90 day supply? 90 day

## 2017-04-03 NOTE — Telephone Encounter (Signed)
Rx refill for Anoro submitted. Per pt request scheduling's number has been left on his voicemail.

## 2017-04-05 DIAGNOSIS — J301 Allergic rhinitis due to pollen: Secondary | ICD-10-CM | POA: Diagnosis not present

## 2017-04-05 DIAGNOSIS — J320 Chronic maxillary sinusitis: Secondary | ICD-10-CM | POA: Diagnosis not present

## 2017-04-14 DIAGNOSIS — M79642 Pain in left hand: Secondary | ICD-10-CM | POA: Diagnosis not present

## 2017-04-14 DIAGNOSIS — M65312 Trigger thumb, left thumb: Secondary | ICD-10-CM | POA: Diagnosis not present

## 2017-04-17 ENCOUNTER — Encounter: Payer: Medicare HMO | Attending: Pulmonary Disease

## 2017-04-17 ENCOUNTER — Other Ambulatory Visit: Payer: Self-pay

## 2017-04-17 VITALS — Ht 70.0 in | Wt 193.5 lb

## 2017-04-17 DIAGNOSIS — K219 Gastro-esophageal reflux disease without esophagitis: Secondary | ICD-10-CM | POA: Diagnosis not present

## 2017-04-17 DIAGNOSIS — Z87891 Personal history of nicotine dependence: Secondary | ICD-10-CM | POA: Insufficient documentation

## 2017-04-17 DIAGNOSIS — Z86010 Personal history of colonic polyps: Secondary | ICD-10-CM | POA: Insufficient documentation

## 2017-04-17 DIAGNOSIS — Z8546 Personal history of malignant neoplasm of prostate: Secondary | ICD-10-CM | POA: Insufficient documentation

## 2017-04-17 DIAGNOSIS — J986 Disorders of diaphragm: Secondary | ICD-10-CM | POA: Diagnosis not present

## 2017-04-17 DIAGNOSIS — J449 Chronic obstructive pulmonary disease, unspecified: Secondary | ICD-10-CM | POA: Diagnosis not present

## 2017-04-17 DIAGNOSIS — Z79899 Other long term (current) drug therapy: Secondary | ICD-10-CM | POA: Diagnosis not present

## 2017-04-17 NOTE — Patient Instructions (Signed)
Patient Instructions  Patient Details  Name: Wesley Preston MRN: [id number] Date of Birth: [birth year] Referring Provider:  Wilhelmina Mcardle, MD  Below are your personal goals for exercise, nutrition, and risk factors. Our goal is to help you stay on track towards obtaining and maintaining these goals. We will be discussing your progress on these goals with you throughout the program.  Initial Exercise Prescription: Initial Exercise Prescription - 04/17/17 1600      Date of Initial Exercise RX and Referring Provider   Date  04/17/17    Referring Provider  Merton Border MD      Treadmill   MPH  1.4    Grade  0.5    Minutes  15    METs  2.17      Recumbant Elliptical   Level  1    RPM  50    Minutes  15    METs  2      T5 Nustep   Level  1    SPM  80    Minutes  15    METs  2      Prescription Details   Frequency (times per week)  3    Duration  Progress to 45 minutes of aerobic exercise without signs/symptoms of physical distress      Intensity   THRR 40-80% of Max Heartrate  98-128    Ratings of Perceived Exertion  11-13    Perceived Dyspnea  0-4      Progression   Progression  Continue to progress workloads to maintain intensity without signs/symptoms of physical distress.      Resistance Training   Training Prescription  Yes    Weight  3 lbs    Reps  10-15       Exercise Goals: Frequency: Be able to perform aerobic exercise two to three times per week in program working toward 2-5 days per week of home exercise.  Intensity: Work with a perceived exertion of 11 (fairly light) - 15 (hard) while following your exercise prescription.  We will make changes to your prescription with you as you progress through the program.   Duration: Be able to do 30 to 45 minutes of continuous aerobic exercise in addition to a 5 minute warm-up and a 5 minute cool-down routine.   Nutrition Goals: Your personal nutrition goals will be established when you do your nutrition  analysis with the dietician.  The following are general nutrition guidelines to follow: Cholesterol < 200mg /day Sodium < 1500mg /day Fiber: Men over 50 yrs - 30 grams per day  Personal Goals: Personal Goals and Risk Factors at Admission - 04/17/17 1506      Core Components/Risk Factors/Patient Goals on Admission    Weight Management  Yes;Weight Maintenance;Weight Loss    Intervention  Weight Management: Develop a combined nutrition and exercise program designed to reach desired caloric intake, while maintaining appropriate intake of nutrient and fiber, sodium and fats, and appropriate energy expenditure required for the weight goal.;Weight Management: Provide education and appropriate resources to help participant work on and attain dietary goals.;Weight Management/Obesity: Establish reasonable short term and long term weight goals.    Admit Weight  193 lb 8 oz (87.8 kg)    Goal Weight: Short Term  188 lb (85.3 kg)    Goal Weight: Long Term  180 lb (81.6 kg)    Expected Outcomes  Short Term: Continue to assess and modify interventions until short term weight is achieved;Long Term: Adherence to nutrition  and physical activity/exercise program aimed toward attainment of established weight goal;Weight Maintenance: Understanding of the daily nutrition guidelines, which includes 25-35% calories from fat, 7% or less cal from saturated fats, less than 200mg  cholesterol, less than 1.5gm of sodium, & 5 or more servings of fruits and vegetables daily;Understanding recommendations for meals to include 15-35% energy as protein, 25-35% energy from fat, 35-60% energy from carbohydrates, less than 200mg  of dietary cholesterol, 20-35 gm of total fiber daily;Weight Loss: Understanding of general recommendations for a balanced deficit meal plan, which promotes 1-2 lb weight loss per week and includes a negative energy balance of [phone] kcal/d;Understanding of distribution of calorie intake throughout the day with the  consumption of 4-5 meals/snacks    Improve shortness of breath with ADL's  Yes    Intervention  Provide education, individualized exercise plan and daily activity instruction to help decrease symptoms of SOB with activities of daily living.    Expected Outcomes  Short Term: Improve cardiorespiratory fitness to achieve a reduction of symptoms when performing ADLs;Long Term: Be able to perform more ADLs without symptoms or delay the onset of symptoms       Tobacco Use Initial Evaluation: Social History   Tobacco Use  Smoking Status Former Smoker  . Packs/day: 1.00  . Years: 20.00  . Pack years: 20.00  . Types: Cigarettes  . Last attempt to quit: 01/04/1988  . Years since quitting: 29.3  Smokeless Tobacco Never Used    Exercise Goals and Review: Exercise Goals    Row Name 04/17/17 1625             Exercise Goals   Increase Physical Activity  Yes       Intervention  Develop an individualized exercise prescription for aerobic and resistive training based on initial evaluation findings, risk stratification, comorbidities and participant's personal goals.;Provide advice, education, support and counseling about physical activity/exercise needs.       Expected Outcomes  Short Term: Attend rehab on a regular basis to increase amount of physical activity.;Long Term: Add in home exercise to make exercise part of routine and to increase amount of physical activity.;Long Term: Exercising regularly at least 3-5 days a week.       Increase Strength and Stamina  Yes       Intervention  Provide advice, education, support and counseling about physical activity/exercise needs.;Develop an individualized exercise prescription for aerobic and resistive training based on initial evaluation findings, risk stratification, comorbidities and participant's personal goals.       Expected Outcomes  Short Term: Increase workloads from initial exercise prescription for resistance, speed, and METs.;Short Term:  Perform resistance training exercises routinely during rehab and add in resistance training at home;Long Term: Improve cardiorespiratory fitness, muscular endurance and strength as measured by increased METs and functional capacity (6MWT)       Able to understand and use rate of perceived exertion (RPE) scale  Yes       Intervention  Provide education and explanation on how to use RPE scale       Expected Outcomes  Short Term: Able to use RPE daily in rehab to express subjective intensity level;Long Term:  Able to use RPE to guide intensity level when exercising independently       Able to understand and use Dyspnea scale  Yes       Intervention  Provide education and explanation on how to use Dyspnea scale       Expected Outcomes  Short Term: Able to  use Dyspnea scale daily in rehab to express subjective sense of shortness of breath during exertion;Long Term: Able to use Dyspnea scale to guide intensity level when exercising independently       Knowledge and understanding of Target Heart Rate Range (THRR)  Yes       Intervention  Provide education and explanation of THRR including how the numbers were predicted and where they are located for reference       Expected Outcomes  Short Term: Able to state/look up THRR;Short Term: Able to use daily as guideline for intensity in rehab;Long Term: Able to use THRR to govern intensity when exercising independently       Able to check pulse independently  Yes       Intervention  Provide education and demonstration on how to check pulse in carotid and radial arteries.;Review the importance of being able to check your own pulse for safety during independent exercise       Expected Outcomes  Long Term: Able to check pulse independently and accurately;Short Term: Able to explain why pulse checking is important during independent exercise       Understanding of Exercise Prescription  Yes       Intervention  Provide education, explanation, and written materials on  patient's individual exercise prescription       Expected Outcomes  Short Term: Able to explain program exercise prescription;Long Term: Able to explain home exercise prescription to exercise independently          Copy of goals given to participant.

## 2017-04-17 NOTE — Progress Notes (Signed)
Pulmonary Individual Treatment Plan  Patient Details  Name: Wesley Preston MRN: [id number] Date of Birth: May 24, 1940 Referring Provider:     Pulmonary Rehab from 04/17/2017 in [facility] Cardiac and Pulmonary Rehab  Referring Provider  Merton Border MD      Initial Encounter Date:    Pulmonary Rehab from 04/17/2017 in [facility] Cardiac and Pulmonary Rehab  Date  04/17/17  Referring Provider  Merton Border MD      Visit Diagnosis: COPD, mild ([place])  Patient's Home Medications on Admission:  Current Outpatient Medications:  .  doxazosin (CARDURA) 4 MG tablet, , Disp: , Rfl:  .  gabapentin (NEURONTIN) 100 MG capsule, TAKE 1 CAPSULE (100 MG TOTAL) BY MOUTH 3 (THREE) TIMES DAILY., Disp: , Rfl: 3 .  NASACORT ALLERGY 24HR 55 MCG/ACT AERO nasal inhaler, , Disp: , Rfl:  .  primidone (MYSOLINE) 50 MG tablet, Take 1 tablet by mouth daily., Disp: , Rfl:  .  propranolol ER (INDERAL LA) 60 MG 24 hr capsule, Take 60 mg by mouth daily. , Disp: , Rfl:  .  umeclidinium-vilanterol (ANORO ELLIPTA) 62.5-25 MCG/INH AEPB, Inhale 1 puff into the lungs daily., Disp: 180 each, Rfl: 2 .  VENTOLIN HFA 108 (90 Base) MCG/ACT inhaler, INHALE 2 PUFFS INTO THE LUNGS EVERY 6 (SIX) HOURS AS NEEDED FOR WHEEZING OR SHORTNESS OF BREATH., Disp: 1 Inhaler, Rfl: 0  Past Medical History: Past Medical History:  Diagnosis Date  . Anxiety   . Back pain   . BPH (benign prostatic hyperplasia)   . Cancer (HCC)    BASAL CELL  . Chest pain, non-cardiac   . Colon polyps   . COPD (chronic obstructive pulmonary disease) ([place])   . Depression   . Essential tremor   . Essential tremor   . Family history of adverse reaction to anesthesia    DAUGHTERS GET NAUSEATED  . GERD (gastroesophageal reflux disease)    H/O  . Helicobacter pylori (H. pylori) infection   . Hyperlipidemia   . Hypertension   . Neuromuscular disorder ([place])    TREMORS  . Reactive airway disease   . Reactive airway disease   . Restless leg syndrome     Tobacco  Use: Social History   Tobacco Use  Smoking Status Former Smoker  . Packs/day: 1.00  . Years: 20.00  . Pack years: 20.00  . Types: Cigarettes  . Last attempt to quit: 01/04/1988  . Years since quitting: 29.3  Smokeless Tobacco Never Used    Labs: Recent Review Flowsheet Data    There is no flowsheet data to display.       Pulmonary Assessment Scores: Pulmonary Assessment Scores    Row Name 04/17/17 1501         ADL UCSD   ADL Phase  Entry     SOB Score total  37     Rest  1     Walk  2     Stairs  3     Bath  1     Dress  1     Shop  2       CAT Score   CAT Score  10       mMRC Score   mMRC Score  1        Pulmonary Function Assessment: Pulmonary Function Assessment - 04/17/17 1504      Pulmonary Function Tests   FVC%  75 % Test performed on 10/30/13    FEV1%  68 %  FEV1/FVC Ratio  71      Breath   Bilateral Breath Sounds  Clear;Decreased    Shortness of Breath  No;Limiting activity       Exercise Target Goals: Date: 04/17/17  Exercise Program Goal: Individual exercise prescription set using results from initial 6 min walk test and THRR while considering  patient's activity barriers and safety.    Exercise Prescription Goal: Initial exercise prescription builds to 30-45 minutes a day of aerobic activity, 2-3 days per week.  Home exercise guidelines will be given to patient during program as part of exercise prescription that the participant will acknowledge.  Activity Barriers & Risk Stratification: Activity Barriers & Cardiac Risk Stratification - 04/17/17 1623      Activity Barriers & Cardiac Risk Stratification   Activity Barriers  Deconditioning;Muscular Weakness;Shortness of Breath       6 Minute Walk: 6 Minute Walk    Row Name 04/17/17 1621         6 Minute Walk   Phase  Initial     Distance  1185 feet     Walk Time  6 minutes     # of Rest Breaks  0     MPH  2.24     METS  2.1     RPE  9     Perceived Dyspnea   1      VO2 Peak  7.36     Symptoms  No     Resting HR  68 bpm     Resting BP  122/64     Resting Oxygen Saturation   94 %     Exercise Oxygen Saturation  during 6 min walk  90 %     Max Ex. HR  78 bpm     Max Ex. BP  126/64     2 Minute Post BP  122/60       Interval HR   6 Minute HR  78     2 Minute Post HR  74     Interval Heart Rate?  Yes       Interval Oxygen   Interval Oxygen?  Yes     Baseline Oxygen Saturation %  94 %     1 Minute Oxygen Saturation %  90 %     1 Minute Liters of Oxygen  0 L Room Air     2 Minute Oxygen Saturation %  90 %     2 Minute Liters of Oxygen  0 L     3 Minute Oxygen Saturation %  90 %     3 Minute Liters of Oxygen  0 L     4 Minute Oxygen Saturation %  90 %     4 Minute Liters of Oxygen  0 L     5 Minute Oxygen Saturation %  90 %     5 Minute Liters of Oxygen  0 L     6 Minute Oxygen Saturation %  91 %     6 Minute Liters of Oxygen  0 L     2 Minute Post Oxygen Saturation %  94 %     2 Minute Post Liters of Oxygen  0 L       Oxygen Initial Assessment: Oxygen Initial Assessment - 04/17/17 1505      Home Oxygen   Home Oxygen Device  None    Sleep Oxygen Prescription  None    Home Exercise Oxygen Prescription  None  Home at Rest Exercise Oxygen Prescription  None      Initial 6 min Walk   Oxygen Used  None      Program Oxygen Prescription   Program Oxygen Prescription  None      Intervention   Short Term Goals  To learn and demonstrate proper use of respiratory medications;To learn and understand importance of maintaining oxygen saturations>88%;To learn and demonstrate proper pursed lip breathing techniques or other breathing techniques.;To learn and understand importance of monitoring SPO2 with pulse oximeter and demonstrate accurate use of the pulse oximeter.    Long  Term Goals  Verbalizes importance of monitoring SPO2 with pulse oximeter and return demonstration;Maintenance of O2 saturations>88%;Exhibits proper breathing techniques,  such as pursed lip breathing or other method taught during program session;Compliance with respiratory medication;Demonstrates proper use of MDI's       Oxygen Re-Evaluation:   Oxygen Discharge (Final Oxygen Re-Evaluation):   Initial Exercise Prescription: Initial Exercise Prescription - 04/17/17 1600      Date of Initial Exercise RX and Referring Provider   Date  04/17/17    Referring Provider  Merton Border MD      Treadmill   MPH  1.4    Grade  0.5    Minutes  15    METs  2.17      Recumbant Elliptical   Level  1    RPM  50    Minutes  15    METs  2      T5 Nustep   Level  1    SPM  80    Minutes  15    METs  2      Prescription Details   Frequency (times per week)  3    Duration  Progress to 45 minutes of aerobic exercise without signs/symptoms of physical distress      Intensity   THRR 40-80% of Max Heartrate  98-128    Ratings of Perceived Exertion  11-13    Perceived Dyspnea  0-4      Progression   Progression  Continue to progress workloads to maintain intensity without signs/symptoms of physical distress.      Resistance Training   Training Prescription  Yes    Weight  3 lbs    Reps  10-15       Perform Capillary Blood Glucose checks as needed.  Exercise Prescription Changes: Exercise Prescription Changes    Row Name 04/17/17 1600             Response to Exercise   Blood Pressure (Admit)  122/64       Blood Pressure (Exercise)  126/64       Blood Pressure (Exit)  122/60       Heart Rate (Admit)  68 bpm       Heart Rate (Exercise)  78 bpm       Heart Rate (Exit)  74 bpm       Oxygen Saturation (Admit)  94 %       Oxygen Saturation (Exercise)  90 %       Oxygen Saturation (Exit)  94 %       Rating of Perceived Exertion (Exercise)  9       Perceived Dyspnea (Exercise)  1       Symptoms  none       Comments  walk test results          Exercise Comments:   Exercise Goals and Review: Exercise  Goals    Row Name 04/17/17 1625              Exercise Goals   Increase Physical Activity  Yes       Intervention  Develop an individualized exercise prescription for aerobic and resistive training based on initial evaluation findings, risk stratification, comorbidities and participant's personal goals.;Provide advice, education, support and counseling about physical activity/exercise needs.       Expected Outcomes  Short Term: Attend rehab on a regular basis to increase amount of physical activity.;Long Term: Add in home exercise to make exercise part of routine and to increase amount of physical activity.;Long Term: Exercising regularly at least 3-5 days a week.       Increase Strength and Stamina  Yes       Intervention  Provide advice, education, support and counseling about physical activity/exercise needs.;Develop an individualized exercise prescription for aerobic and resistive training based on initial evaluation findings, risk stratification, comorbidities and participant's personal goals.       Expected Outcomes  Short Term: Increase workloads from initial exercise prescription for resistance, speed, and METs.;Short Term: Perform resistance training exercises routinely during rehab and add in resistance training at home;Long Term: Improve cardiorespiratory fitness, muscular endurance and strength as measured by increased METs and functional capacity (6MWT)       Able to understand and use rate of perceived exertion (RPE) scale  Yes       Intervention  Provide education and explanation on how to use RPE scale       Expected Outcomes  Short Term: Able to use RPE daily in rehab to express subjective intensity level;Long Term:  Able to use RPE to guide intensity level when exercising independently       Able to understand and use Dyspnea scale  Yes       Intervention  Provide education and explanation on how to use Dyspnea scale       Expected Outcomes  Short Term: Able to use Dyspnea scale daily in rehab to express subjective  sense of shortness of breath during exertion;Long Term: Able to use Dyspnea scale to guide intensity level when exercising independently       Knowledge and understanding of Target Heart Rate Range (THRR)  Yes       Intervention  Provide education and explanation of THRR including how the numbers were predicted and where they are located for reference       Expected Outcomes  Short Term: Able to state/look up THRR;Short Term: Able to use daily as guideline for intensity in rehab;Long Term: Able to use THRR to govern intensity when exercising independently       Able to check pulse independently  Yes       Intervention  Provide education and demonstration on how to check pulse in carotid and radial arteries.;Review the importance of being able to check your own pulse for safety during independent exercise       Expected Outcomes  Long Term: Able to check pulse independently and accurately;Short Term: Able to explain why pulse checking is important during independent exercise       Understanding of Exercise Prescription  Yes       Intervention  Provide education, explanation, and written materials on patient's individual exercise prescription       Expected Outcomes  Short Term: Able to explain program exercise prescription;Long Term: Able to explain home exercise prescription to exercise independently  Exercise Goals Re-Evaluation :   Discharge Exercise Prescription (Final Exercise Prescription Changes): Exercise Prescription Changes - 04/17/17 1600      Response to Exercise   Blood Pressure (Admit)  122/64    Blood Pressure (Exercise)  126/64    Blood Pressure (Exit)  122/60    Heart Rate (Admit)  68 bpm    Heart Rate (Exercise)  78 bpm    Heart Rate (Exit)  74 bpm    Oxygen Saturation (Admit)  94 %    Oxygen Saturation (Exercise)  90 %    Oxygen Saturation (Exit)  94 %    Rating of Perceived Exertion (Exercise)  9    Perceived Dyspnea (Exercise)  1    Symptoms  none     Comments  walk test results       Nutrition:  Target Goals: Understanding of nutrition guidelines, daily intake of sodium <1566m, cholesterol <2034m calories 30% from fat and 7% or less from saturated fats, daily to have 5 or more servings of fruits and vegetables.  Biometrics: Pre Biometrics - 04/17/17 1626      Pre Biometrics   Height  5' 10"  (1.778 m)    Weight  193 lb 8 oz (87.8 kg)    Waist Circumference  37 inches    Hip Circumference  42 inches    Waist to Hip Ratio  0.88 %    BMI (Calculated)  27.76        Nutrition Therapy Plan and Nutrition Goals: Nutrition Therapy & Goals - 04/17/17 1459      Personal Nutrition Goals   Comments  Lose some weight, learn to eat healthier and meet with the dietician.      Intervention Plan   Intervention  Prescribe, educate and counsel regarding individualized specific dietary modifications aiming towards targeted core components such as weight, hypertension, lipid management, diabetes, heart failure and other comorbidities.;Nutrition handout(s) given to patient.    Expected Outcomes  Short Term Goal: Understand basic principles of dietary content, such as calories, fat, sodium, cholesterol and nutrients.;Long Term Goal: Adherence to prescribed nutrition plan.       Nutrition Assessments: Nutrition Assessments - 04/17/17 1500      MEDFICTS Scores   Pre Score  76       Nutrition Goals Re-Evaluation:   Nutrition Goals Discharge (Final Nutrition Goals Re-Evaluation):   Psychosocial: Target Goals: Acknowledge presence or absence of significant depression and/or stress, maximize coping skills, provide positive support system. Participant is able to verbalize types and ability to use techniques and skills needed for reducing stress and depression.   Initial Review & Psychosocial Screening: Initial Psych Review & Screening - 04/17/17 1456      Initial Review   Current issues with  Current Stress Concerns    Source of Stress  Concerns  Chronic Illness    Comments  COPD and normal stress of day to day. 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      Family Dynamics   Good Support System?  Yes    Comments  He can look to his wife and daughters for support.      Barriers   Psychosocial barriers to participate in program  The patient should benefit from training in stress management and relaxation.      Screening Interventions   Interventions  Encouraged to exercise;Program counselor consult;To provide support and resources with identified psychosocial needs;Provide feedback about the scores to participant    Expected  Outcomes  Short Term goal: Utilizing psychosocial counselor, staff and physician to assist with identification of specific Stressors or current issues interfering with healing process. Setting desired goal for each stressor or current issue identified.;Long Term Goal: Stressors or current issues are controlled or eliminated.;Short Term goal: Identification and review with participant of any Quality of Life or Depression concerns found by scoring the questionnaire.;Long Term goal: The participant improves quality of Life and PHQ9 Scores as seen by post scores and/or verbalization of changes       Quality of Life Scores:  Scores of 19 and below usually indicate a poorer quality of life in these areas.  A difference of  2-3 points is a clinically meaningful difference.  A difference of 2-3 points in the total score of the Quality of Life Index has been associated with significant improvement in overall quality of life, self-image, physical symptoms, and general health in studies assessing change in quality of life.  PHQ-9: Recent Review Flowsheet Data    Depression screen [facility] 2/9 04/17/2017   Decreased Interest 1   Down, Depressed, Hopeless 1   PHQ - 2 Score 2   Altered sleeping 2   Tired, decreased energy 2   Change in appetite 1   Feeling bad or failure about yourself  0    Trouble concentrating 2   Moving slowly or fidgety/restless 2   Suicidal thoughts 0   PHQ-9 Score 11   Difficult doing work/chores Somewhat difficult     Interpretation of Total Score  Total Score Depression Severity:  1-4 = Minimal depression, 5-9 = Mild depression, 10-14 = Moderate depression, 15-19 = Moderately severe depression, 20-27 = Severe depression   Psychosocial Evaluation and Intervention:   Psychosocial Re-Evaluation:   Psychosocial Discharge (Final Psychosocial Re-Evaluation):   Education: Education Goals: Education classes will be provided on a weekly basis, covering required topics. Participant will state understanding/return demonstration of topics presented.  Learning Barriers/Preferences: Learning Barriers/Preferences - 04/17/17 1505      Learning Barriers/Preferences   Learning Barriers  None    Learning Preferences  None       Education Topics:  Initial Evaluation Education: - Verbal, written and demonstration of respiratory meds, oximetry and breathing techniques. Instruction on use of nebulizers and MDIs and importance of monitoring MDI activations.   Pulmonary Rehab from 04/17/2017 in [facility] Cardiac and Pulmonary Rehab  Date  04/17/17  Educator  [facility]  Instruction Review Code  1- Verbalizes Understanding      General Nutrition Guidelines/Fats and Fiber: -Group instruction provided by verbal, written material, models and posters to present the general guidelines for heart healthy nutrition. Gives an explanation and review of dietary fats and fiber.   Controlling Sodium/Reading Food Labels: -Group verbal and written material supporting the discussion of sodium use in heart healthy nutrition. Review and explanation with models, verbal and written materials for utilization of the food label.   Exercise Physiology & General Exercise Guidelines: - Group verbal and written instruction with models to review the exercise physiology of the cardiovascular  system and associated critical values. Provides general exercise guidelines with specific guidelines to those with heart or lung disease.    Aerobic Exercise & Resistance Training: - Gives group verbal and written instruction on the various components of exercise. Focuses on aerobic and resistive training programs and the benefits of this training and how to safely progress through these programs.   Flexibility, Balance, Mind/Body Relaxation: Provides group verbal/written instruction on the benefits of flexibility and  balance training, including mind/body exercise modes such as yoga, pilates and tai chi.  Demonstration and skill practice provided.   Stress and Anxiety: - Provides group verbal and written instruction about the health risks of elevated stress and causes of high stress.  Discuss the correlation between heart/lung disease and anxiety and treatment options. Review healthy ways to manage with stress and anxiety.   Depression: - Provides group verbal and written instruction on the correlation between heart/lung disease and depressed mood, treatment options, and the stigmas associated with seeking treatment.   Exercise & Equipment Safety: - Individual verbal instruction and demonstration of equipment use and safety with use of the equipment.   Pulmonary Rehab from 04/17/2017 in [facility] Cardiac and Pulmonary Rehab  Date  04/17/17  Educator  [facility]  Instruction Review Code  1- Verbalizes Understanding      Infection Prevention: - Provides verbal and written material to individual with discussion of infection control including proper hand washing and proper equipment cleaning during exercise session.   Pulmonary Rehab from 04/17/2017 in [facility] Cardiac and Pulmonary Rehab  Date  04/17/17  Educator  [facility]  Instruction Review Code  1- Verbalizes Understanding      Falls Prevention: - Provides verbal and written material to individual with discussion of falls prevention and safety.    Pulmonary Rehab from 04/17/2017 in [facility] Cardiac and Pulmonary Rehab  Date  04/17/17  Educator  [facility]  Instruction Review Code  1- Verbalizes Understanding      Diabetes: - Individual verbal and written instruction to review signs/symptoms of diabetes, desired ranges of glucose level fasting, after meals and with exercise. Advice that pre and post exercise glucose checks will be done for 3 sessions at entry of program.   Chronic Lung Diseases: - Group verbal and written instruction to review updates, respiratory medications, advancements in procedures and treatments. Discuss use of supplemental oxygen including available portable oxygen systems, continuous and intermittent flow rates, concentrators, personal use and safety guidelines. Review proper use of inhaler and spacers. Provide informative websites for self-education.    Energy Conservation: - Provide group verbal and written instruction for methods to conserve energy, plan and organize activities. Instruct on pacing techniques, use of adaptive equipment and posture/positioning to relieve shortness of breath.   Triggers and Exacerbations: - Group verbal and written instruction to review types of environmental triggers and ways to prevent exacerbations. Discuss weather changes, air quality and the benefits of nasal washing. Review warning signs and symptoms to help prevent infections. Discuss techniques for effective airway clearance, coughing, and vibrations.   AED/CPR: - Group verbal and written instruction with the use of models to demonstrate the basic use of the AED with the basic ABC's of resuscitation.   Anatomy and Physiology of the Lungs: - Group verbal and written instruction with the use of models to provide basic lung anatomy and physiology related to function, structure and complications of lung disease.   Anatomy & Physiology of the Heart: - Group verbal and written instruction and models provide basic cardiac anatomy  and physiology, with the coronary electrical and arterial systems. Review of Valvular disease and Heart Failure   Cardiac Medications: - Group verbal and written instruction to review commonly prescribed medications for heart disease. Reviews the medication, class of the drug, and side effects.   Know Your Numbers and Risk Factors: -Group verbal and written instruction about important numbers in your health.  Discussion of what are risk factors and how they play a role in  the disease process.  Review of Cholesterol, Blood Pressure, Diabetes, and BMI and the role they play in your overall health.   Sleep Hygiene: -Provides group verbal and written instruction about how sleep can affect your health.  Define sleep hygiene, discuss sleep cycles and impact of sleep habits. Review good sleep hygiene tips.    Other: -Provides group and verbal instruction on various topics (see comments)    Knowledge Questionnaire Score: Knowledge Questionnaire Score - 04/17/17 1505      Knowledge Questionnaire Score   Pre Score  13/18 reviewed with patient        Core Components/Risk Factors/Patient Goals at Admission: Personal Goals and Risk Factors at Admission - 04/17/17 1506      Core Components/Risk Factors/Patient Goals on Admission    Weight Management  Yes;Weight Maintenance;Weight Loss    Intervention  Weight Management: Develop a combined nutrition and exercise program designed to reach desired caloric intake, while maintaining appropriate intake of nutrient and fiber, sodium and fats, and appropriate energy expenditure required for the weight goal.;Weight Management: Provide education and appropriate resources to help participant work on and attain dietary goals.;Weight Management/Obesity: Establish reasonable short term and long term weight goals.    Admit Weight  193 lb 8 oz (87.8 kg)    Goal Weight: Short Term  188 lb (85.3 kg)    Goal Weight: Long Term  180 lb (81.6 kg)    Expected  Outcomes  Short Term: Continue to assess and modify interventions until short term weight is achieved;Long Term: Adherence to nutrition and physical activity/exercise program aimed toward attainment of established weight goal;Weight Maintenance: Understanding of the daily nutrition guidelines, which includes 25-35% calories from fat, 7% or less cal from saturated fats, less than 229m cholesterol, less than 1.5gm of sodium, & 5 or more servings of fruits and vegetables daily;Understanding recommendations for meals to include 15-35% energy as protein, 25-35% energy from fat, 35-60% energy from carbohydrates, less than 2016mof dietary cholesterol, 20-35 gm of total fiber daily;Weight Loss: Understanding of general recommendations for a balanced deficit meal plan, which promotes 1-2 lb weight loss per week and includes a negative energy balance of [phone] kcal/d;Understanding of distribution of calorie intake throughout the day with the consumption of 4-5 meals/snacks    Improve shortness of breath with ADL's  Yes    Intervention  Provide education, individualized exercise plan and daily activity instruction to help decrease symptoms of SOB with activities of daily living.    Expected Outcomes  Short Term: Improve cardiorespiratory fitness to achieve a reduction of symptoms when performing ADLs;Long Term: Be able to perform more ADLs without symptoms or delay the onset of symptoms       Core Components/Risk Factors/Patient Goals Review:    Core Components/Risk Factors/Patient Goals at Discharge (Final Review):    ITP Comments: ITP Comments    Row Name 04/17/17 1441           ITP Comments  Medical Evaluation completed. Chart sent for review and changes to Dr. MaEmily Filbertirector of [place]osis can be found in [facility]          Comments: Initial ITP

## 2017-04-19 DIAGNOSIS — Z8546 Personal history of malignant neoplasm of prostate: Secondary | ICD-10-CM | POA: Diagnosis not present

## 2017-04-19 DIAGNOSIS — Z87891 Personal history of nicotine dependence: Secondary | ICD-10-CM | POA: Diagnosis not present

## 2017-04-19 DIAGNOSIS — Z79899 Other long term (current) drug therapy: Secondary | ICD-10-CM | POA: Diagnosis not present

## 2017-04-19 DIAGNOSIS — K219 Gastro-esophageal reflux disease without esophagitis: Secondary | ICD-10-CM | POA: Diagnosis not present

## 2017-04-19 DIAGNOSIS — J449 Chronic obstructive pulmonary disease, unspecified: Secondary | ICD-10-CM

## 2017-04-19 DIAGNOSIS — J986 Disorders of diaphragm: Secondary | ICD-10-CM | POA: Diagnosis not present

## 2017-04-19 DIAGNOSIS — Z86010 Personal history of colonic polyps: Secondary | ICD-10-CM | POA: Diagnosis not present

## 2017-04-19 NOTE — Progress Notes (Signed)
Daily Session Note  Patient Details  Name: Wesley Preston MRN: [id number] Date of Birth: 06-11-1940 Referring Provider:     Pulmonary Rehab from 04/17/2017 in [facility] Cardiac and Pulmonary Rehab  Referring Provider  Merton Border MD      Encounter Date: 04/19/2017  Check In: Session Check In - 04/19/17 1008      Check-In   Location  [facility]-Cardiac & Pulmonary Rehab    Staff Present  Justin Mend RCP,RRT,BSRT;Amanda Oletta Darter, BA, ACSM CEP, Exercise Physiologist;Jessica Luan Pulling, MA, RCEP, CCRP, Exercise Physiologist    Supervising physician immediately available to respond to emergencies  [facility] immediately available ER MD    Physician(s)  Dr. Owens Shark and Jimmye Norman    Medication changes reported      No    Fall or balance concerns reported     No    Tobacco Cessation  No Change    Warm-up and Cool-down  Performed as group-led instruction    Resistance Training Performed  Yes    VAD Patient?  No      Pain Assessment   Currently in Pain?  No/denies          Social History   Tobacco Use  Smoking Status Former Smoker  . Packs/day: 1.00  . Years: 20.00  . Pack years: 20.00  . Types: Cigarettes  . Last attempt to quit: 01/04/1988  . Years since quitting: 29.3  Smokeless Tobacco Never Used    Goals Met:  Exercise tolerated well Personal goals reviewed Queuing for purse lip breathing No report of cardiac concerns or symptoms Strength training completed today  Goals Unmet:  Not Applicable  Comments: First full day of exercise!  Patient was oriented to gym and equipment including functions, settings, policies, and procedures.  Patient's individual exercise prescription and treatment plan were reviewed.  All starting workloads were established based on the results of the 6 minute walk test done at initial orientation visit.  The plan for exercise progression was also introduced and progression will be customized based on patient's performance and goals.   Dr. Emily Filbert is  Medical Director for [place] and [facility].

## 2017-04-21 ENCOUNTER — Encounter: Payer: Medicare HMO | Admitting: *Deleted

## 2017-04-21 DIAGNOSIS — Z79899 Other long term (current) drug therapy: Secondary | ICD-10-CM | POA: Diagnosis not present

## 2017-04-21 DIAGNOSIS — Z8546 Personal history of malignant neoplasm of prostate: Secondary | ICD-10-CM | POA: Diagnosis not present

## 2017-04-21 DIAGNOSIS — J449 Chronic obstructive pulmonary disease, unspecified: Secondary | ICD-10-CM | POA: Diagnosis not present

## 2017-04-21 DIAGNOSIS — Z86010 Personal history of colonic polyps: Secondary | ICD-10-CM | POA: Diagnosis not present

## 2017-04-21 DIAGNOSIS — J986 Disorders of diaphragm: Secondary | ICD-10-CM | POA: Diagnosis not present

## 2017-04-21 DIAGNOSIS — Z87891 Personal history of nicotine dependence: Secondary | ICD-10-CM | POA: Diagnosis not present

## 2017-04-21 DIAGNOSIS — K219 Gastro-esophageal reflux disease without esophagitis: Secondary | ICD-10-CM | POA: Diagnosis not present

## 2017-04-21 NOTE — Progress Notes (Signed)
Daily Session Note  Patient Details  Name: Wesley Preston MRN: [id number] Date of Birth: 22-Mar-1940 Referring Provider:     Pulmonary Rehab from 04/17/2017 in [facility] Cardiac and Pulmonary Rehab  Referring Provider  Merton Border MD      Encounter Date: 04/21/2017  Check In: Session Check In - 04/21/17 1005      Check-In   Location  [facility]-Cardiac & Pulmonary Rehab    Staff Present  Renita Papa, RN BSN;Jennings Stirling Luan Pulling, MA, RCEP, CCRP, Exercise Physiologist;Mandi Zachery Conch, BS, [place] physician immediately available to respond to emergencies  [facility] immediately available ER MD    Physician(s)  Drs. Owens Shark and Lord    Medication changes reported      No    Fall or balance concerns reported     No    Warm-up and Cool-down  Performed as group-led Higher education careers adviser Performed  Yes    VAD Patient?  No      Pain Assessment   Currently in Pain?  No/denies          Social History   Tobacco Use  Smoking Status Former Smoker  . Packs/day: 1.00  . Years: 20.00  . Pack years: 20.00  . Types: Cigarettes  . Last attempt to quit: 01/04/1988  . Years since quitting: 29.3  Smokeless Tobacco Never Used    Goals Met:  Proper associated with RPD/PD & O2 Sat Independence with exercise equipment Using PLB without cueing & demonstrates good technique Exercise tolerated well No report of cardiac concerns or symptoms Strength training completed today  Goals Unmet:  Not Applicable  Comments: Pt able to follow exercise prescription today without complaint.  Will continue to monitor for progression.    Dr. Emily Filbert is Medical Director for [place] and [facility].

## 2017-04-24 DIAGNOSIS — Z8546 Personal history of malignant neoplasm of prostate: Secondary | ICD-10-CM | POA: Diagnosis not present

## 2017-04-24 DIAGNOSIS — Z86010 Personal history of colonic polyps: Secondary | ICD-10-CM | POA: Diagnosis not present

## 2017-04-24 DIAGNOSIS — K219 Gastro-esophageal reflux disease without esophagitis: Secondary | ICD-10-CM | POA: Diagnosis not present

## 2017-04-24 DIAGNOSIS — J986 Disorders of diaphragm: Secondary | ICD-10-CM | POA: Diagnosis not present

## 2017-04-24 DIAGNOSIS — Z87891 Personal history of nicotine dependence: Secondary | ICD-10-CM | POA: Diagnosis not present

## 2017-04-24 DIAGNOSIS — J449 Chronic obstructive pulmonary disease, unspecified: Secondary | ICD-10-CM | POA: Diagnosis not present

## 2017-04-24 DIAGNOSIS — Z79899 Other long term (current) drug therapy: Secondary | ICD-10-CM | POA: Diagnosis not present

## 2017-04-24 NOTE — Progress Notes (Signed)
Daily Session Note  Patient Details  Name: Wesley Preston MRN: [id number] Date of Birth: 08/31/1940 Referring Provider:     Pulmonary Rehab from 04/17/2017 in [facility] Cardiac and Pulmonary Rehab  Referring Provider  Merton Border MD      Encounter Date: 04/24/2017  Check In: Session Check In - 04/24/17 1009      Check-In   Location  [facility]-Cardiac & Pulmonary Rehab    Staff Present  Earlean Shawl, BS, ACSM CEP, Exercise Physiologist;Mandi Zachery Conch, BS, PEC;Joseph [employer] physician immediately available to respond to emergencies  [facility] immediately available ER MD    Physician(s)  Dr. Reita Cliche and Cinda Quest    Medication changes reported      No    Fall or balance concerns reported     No    Tobacco Cessation  No Change    Warm-up and Cool-down  Performed as group-led instruction    Resistance Training Performed  Yes    VAD Patient?  No      Pain Assessment   Currently in Pain?  No/denies          Social History   Tobacco Use  Smoking Status Former Smoker  . Packs/day: 1.00  . Years: 20.00  . Pack years: 20.00  . Types: Cigarettes  . Last attempt to quit: 01/04/1988  . Years since quitting: 29.3  Smokeless Tobacco Never Used    Goals Met:  Independence with exercise equipment Exercise tolerated well No report of cardiac concerns or symptoms Strength training completed today  Goals Unmet:  Not Applicable  Comments: Pt able to follow exercise prescription today without complaint.  Will continue to monitor for progression.   Dr. Emily Filbert is Medical Director for [place] and [facility].

## 2017-04-25 ENCOUNTER — Ambulatory Visit: Payer: Medicare HMO | Attending: Pulmonary Disease

## 2017-04-25 DIAGNOSIS — J986 Disorders of diaphragm: Secondary | ICD-10-CM | POA: Diagnosis not present

## 2017-04-25 DIAGNOSIS — J449 Chronic obstructive pulmonary disease, unspecified: Secondary | ICD-10-CM | POA: Insufficient documentation

## 2017-04-25 DIAGNOSIS — R0609 Other forms of dyspnea: Secondary | ICD-10-CM

## 2017-04-25 MED ORDER — ALBUTEROL SULFATE (2.5 MG/3ML) 0.083% IN NEBU
2.5000 mg | INHALATION_SOLUTION | Freq: Once | RESPIRATORY_TRACT | Status: AC
  Administered 2017-04-25: 2.5 mg via RESPIRATORY_TRACT

## 2017-04-26 DIAGNOSIS — K219 Gastro-esophageal reflux disease without esophagitis: Secondary | ICD-10-CM | POA: Diagnosis not present

## 2017-04-26 DIAGNOSIS — J449 Chronic obstructive pulmonary disease, unspecified: Secondary | ICD-10-CM

## 2017-04-26 DIAGNOSIS — J986 Disorders of diaphragm: Secondary | ICD-10-CM | POA: Diagnosis not present

## 2017-04-26 DIAGNOSIS — Z86010 Personal history of colonic polyps: Secondary | ICD-10-CM | POA: Diagnosis not present

## 2017-04-26 DIAGNOSIS — Z79899 Other long term (current) drug therapy: Secondary | ICD-10-CM | POA: Diagnosis not present

## 2017-04-26 DIAGNOSIS — Z87891 Personal history of nicotine dependence: Secondary | ICD-10-CM | POA: Diagnosis not present

## 2017-04-26 DIAGNOSIS — Z8546 Personal history of malignant neoplasm of prostate: Secondary | ICD-10-CM | POA: Diagnosis not present

## 2017-04-26 NOTE — Progress Notes (Signed)
Daily Session Note  Patient Details  Name: Wesley Preston MRN: [id number] Date of Birth: 02/14/1940 Referring Provider:     Pulmonary Rehab from 04/17/2017 in [facility] Cardiac and Pulmonary Rehab  Referring Provider  Merton Border MD      Encounter Date: 04/26/2017  Check In: Session Check In - 04/26/17 1015      Check-In   Location  [facility]-Cardiac & Pulmonary Rehab    Staff Present  Justin Mend RCP,RRT,BSRT;Amanda Oletta Darter, BA, ACSM CEP, Exercise Physiologist;Jessica Luan Pulling, MA, RCEP, CCRP, Exercise Physiologist    Supervising physician immediately available to respond to emergencies  [facility] immediately available ER MD    Physician(s)  Dr. Archie Balboa and Joni Fears    Medication changes reported      No    Fall or balance concerns reported     No    Tobacco Cessation  No Change    Warm-up and Cool-down  Performed as group-led instruction    Resistance Training Performed  Yes    VAD Patient?  No      Pain Assessment   Currently in Pain?  No/denies          Social History   Tobacco Use  Smoking Status Former Smoker  . Packs/day: 1.00  . Years: 20.00  . Pack years: 20.00  . Types: Cigarettes  . Last attempt to quit: 01/04/1988  . Years since quitting: 29.3  Smokeless Tobacco Never Used    Goals Met:  Independence with exercise equipment Exercise tolerated well No report of cardiac concerns or symptoms Strength training completed today  Goals Unmet:  Not Applicable  Comments: Pt able to follow exercise prescription today without complaint.  Will continue to monitor for progression.   Dr. Emily Filbert is Medical Director for [place] and [facility].

## 2017-04-26 NOTE — Progress Notes (Signed)
Daily Session Note  Patient Details  Name: Wesley Preston MRN: [id number] Date of Birth: 1940-02-06 Referring Provider:     Pulmonary Rehab from 04/17/2017 in [facility] Cardiac and Pulmonary Rehab  Referring Provider  Merton Border MD      Encounter Date: 04/26/2017  Check In: Session Check In - 04/26/17 1015      Check-In   Location  [facility]-Cardiac & Pulmonary Rehab    Staff Present  Justin Mend RCP,RRT,BSRT;Latifah Padin Oletta Darter, BA, ACSM CEP, Exercise Physiologist;Jessica Luan Pulling, MA, RCEP, CCRP, Exercise Physiologist    Supervising physician immediately available to respond to emergencies  [facility] immediately available ER MD    Physician(s)  Dr. Archie Balboa and Joni Fears    Medication changes reported      No    Fall or balance concerns reported     No    Tobacco Cessation  No Change    Warm-up and Cool-down  Performed as group-led instruction    Resistance Training Performed  Yes    VAD Patient?  No      Pain Assessment   Currently in Pain?  No/denies        Exercise Prescription Changes - 04/26/17 1100      Home Exercise Plan   Plans to continue exercise at  [facility] (comment) [place]akers [place] [employer]    Frequency  Add 1 additional day to program exercise sessions.    Initial Home Exercises Provided  04/26/17       Social History   Tobacco Use  Smoking Status Former Smoker  . Packs/day: 1.00  . Years: 20.00  . Pack years: 20.00  . Types: Cigarettes  . Last attempt to quit: 01/04/1988  . Years since quitting: 29.3  Smokeless Tobacco Never Used    Goals Met:  Independence with exercise equipment Exercise tolerated well No report of cardiac concerns or symptoms Strength training completed today  Goals Unmet:  Not Applicable  Comments: .Reviewed home exercise with pt today.  Pt plans to go to [facility] for exercise.  Reviewed THR, pulse, RPE, sign and symptoms, NTG use, and when to call 911 or MD.  Also discussed weather considerations and indoor  options.  Pt voiced understanding.    Dr. Emily Filbert is Medical Director for [place] and [facility].

## 2017-04-28 DIAGNOSIS — Z8546 Personal history of malignant neoplasm of prostate: Secondary | ICD-10-CM | POA: Diagnosis not present

## 2017-04-28 DIAGNOSIS — K219 Gastro-esophageal reflux disease without esophagitis: Secondary | ICD-10-CM | POA: Diagnosis not present

## 2017-04-28 DIAGNOSIS — Z86010 Personal history of colonic polyps: Secondary | ICD-10-CM | POA: Diagnosis not present

## 2017-04-28 DIAGNOSIS — Z79899 Other long term (current) drug therapy: Secondary | ICD-10-CM | POA: Diagnosis not present

## 2017-04-28 DIAGNOSIS — J986 Disorders of diaphragm: Secondary | ICD-10-CM | POA: Diagnosis not present

## 2017-04-28 DIAGNOSIS — J449 Chronic obstructive pulmonary disease, unspecified: Secondary | ICD-10-CM

## 2017-04-28 DIAGNOSIS — Z87891 Personal history of nicotine dependence: Secondary | ICD-10-CM | POA: Diagnosis not present

## 2017-04-28 NOTE — Progress Notes (Signed)
Daily Session Note  Patient Details  Name: Wesley Preston MRN: [id number] Date of Birth: November 08, 1940 Referring Provider:     Pulmonary Rehab from 04/17/2017 in [facility] Cardiac and Pulmonary Rehab  Referring Provider  Merton Border MD      Encounter Date: 04/28/2017  Check In: Session Check In - 04/28/17 1448      Check-In   Staff Present  Renita Papa, RN BSN;Joseph Hood RCP,RRT,BSRT;Amanda Oletta Darter, [place], ACSM CEP, Exercise Physiologist    Supervising physician immediately available to respond to emergencies  [facility] immediately available ER MD    Physician(s)  Jimmye Norman and Corky Downs    Medication changes reported      No    Fall or balance concerns reported     No    Warm-up and Cool-down  Performed as group-led Higher education careers adviser Performed  Yes    VAD Patient?  No      Pain Assessment   Currently in Pain?  No/denies          Social History   Tobacco Use  Smoking Status Former Smoker  . Packs/day: 1.00  . Years: 20.00  . Pack years: 20.00  . Types: Cigarettes  . Last attempt to quit: 01/04/1988  . Years since quitting: 29.3  Smokeless Tobacco Never Used    Goals Met:  Proper associated with RPD/PD & O2 Sat Independence with exercise equipment Using PLB without cueing & demonstrates good technique Exercise tolerated well No report of cardiac concerns or symptoms Strength training completed today  Goals Unmet:  Not Applicable  Comments: Pt able to follow exercise prescription today without complaint.  Will continue to monitor for progression.    Dr. Emily Filbert is Medical Director for [place] and [facility].

## 2017-05-01 DIAGNOSIS — K219 Gastro-esophageal reflux disease without esophagitis: Secondary | ICD-10-CM | POA: Diagnosis not present

## 2017-05-01 DIAGNOSIS — E538 Deficiency of other specified B group vitamins: Secondary | ICD-10-CM | POA: Diagnosis not present

## 2017-05-01 DIAGNOSIS — Z8546 Personal history of malignant neoplasm of prostate: Secondary | ICD-10-CM | POA: Diagnosis not present

## 2017-05-01 DIAGNOSIS — Z79899 Other long term (current) drug therapy: Secondary | ICD-10-CM | POA: Diagnosis not present

## 2017-05-01 DIAGNOSIS — J449 Chronic obstructive pulmonary disease, unspecified: Secondary | ICD-10-CM | POA: Diagnosis not present

## 2017-05-01 DIAGNOSIS — Z87891 Personal history of nicotine dependence: Secondary | ICD-10-CM | POA: Diagnosis not present

## 2017-05-01 DIAGNOSIS — Z86010 Personal history of colonic polyps: Secondary | ICD-10-CM | POA: Diagnosis not present

## 2017-05-01 DIAGNOSIS — J986 Disorders of diaphragm: Secondary | ICD-10-CM | POA: Diagnosis not present

## 2017-05-01 NOTE — Progress Notes (Signed)
Daily Session Note  Patient Details  Name: Wesley Preston MRN: [id number] Date of Birth: [birth year] Referring Provider:     Pulmonary Rehab from 04/17/2017 in [facility] Cardiac and Pulmonary Rehab  Referring Provider  Merton Border MD      Encounter Date: 05/01/2017  Check In: Session Check In - 05/01/17 1016      Check-In   Location  [facility]-Cardiac & Pulmonary Rehab    Staff Present  Earlean Shawl, BS, ACSM CEP, Exercise Physiologist;Laureen Owens Shark, BS, RRT, Respiratory Therapist;Chelli Yerkes [place] physician immediately available to respond to emergencies  [facility] immediately available ER MD    Physician(s)  Dr. Corky Downs and Alfred Levins    Medication changes reported      No    Fall or balance concerns reported     No    Tobacco Cessation  No Change    Warm-up and Cool-down  Performed as group-led instruction    Resistance Training Performed  Yes    VAD Patient?  No      Pain Assessment   Currently in Pain?  No/denies          Social History   Tobacco Use  Smoking Status Former Smoker  . Packs/day: 1.00  . Years: 20.00  . Pack years: 20.00  . Types: Cigarettes  . Last attempt to quit: 01/04/1988  . Years since quitting: 29.3  Smokeless Tobacco Never Used    Goals Met:  Independence with exercise equipment Exercise tolerated well No report of cardiac concerns or symptoms Strength training completed today  Goals Unmet:  Not Applicable  Comments: Pt able to follow exercise prescription today without complaint.  Will continue to monitor for progression.   Dr. Emily Filbert is Medical Director for [place] and [facility].

## 2017-05-02 ENCOUNTER — Ambulatory Visit: Payer: Medicare HMO

## 2017-05-03 ENCOUNTER — Encounter: Payer: Medicare HMO | Attending: Pulmonary Disease

## 2017-05-03 DIAGNOSIS — K219 Gastro-esophageal reflux disease without esophagitis: Secondary | ICD-10-CM | POA: Insufficient documentation

## 2017-05-03 DIAGNOSIS — Z8546 Personal history of malignant neoplasm of prostate: Secondary | ICD-10-CM | POA: Insufficient documentation

## 2017-05-03 DIAGNOSIS — Z79899 Other long term (current) drug therapy: Secondary | ICD-10-CM | POA: Insufficient documentation

## 2017-05-03 DIAGNOSIS — J449 Chronic obstructive pulmonary disease, unspecified: Secondary | ICD-10-CM | POA: Insufficient documentation

## 2017-05-03 DIAGNOSIS — J986 Disorders of diaphragm: Secondary | ICD-10-CM | POA: Insufficient documentation

## 2017-05-03 DIAGNOSIS — Z87891 Personal history of nicotine dependence: Secondary | ICD-10-CM | POA: Insufficient documentation

## 2017-05-03 DIAGNOSIS — Z86010 Personal history of colonic polyps: Secondary | ICD-10-CM | POA: Insufficient documentation

## 2017-05-03 NOTE — Progress Notes (Signed)
Daily Session Note  Patient Details  Name: Wesley Preston MRN: [id number] Date of Birth: 1940-06-17 Referring Provider:     Pulmonary Rehab from 04/17/2017 in [facility] Cardiac and Pulmonary Rehab  Referring Provider  Merton Border MD      Encounter Date: 05/03/2017  Check In: Session Check In - 05/03/17 0955      Check-In   Location  [facility]Cardiac & Pulmonary Rehab    Staff Present  Justin Mend RCP,RRT,BSRT;Amanda Oletta Darter, BA, ACSM CEP, Exercise Physiologist;Jessica Luan Pulling, MA, RCEP, CCRP, Exercise Physiologist    Supervising physician immediately available to respond to emergencies  [facility] immediately available ER MD    Physician(s)  Dr. Alfred Levins and Burlene Arnt    Medication changes reported      No    Fall or balance concerns reported     No    Tobacco Cessation  No Change    Warm-up and Cool-down  Performed as group-led instruction    Resistance Training Performed  Yes    VAD Patient?  No      Pain Assessment   Currently in Pain?  No/denies          Social History   Tobacco Use  Smoking Status Former Smoker  . Packs/day: 1.00  . Years: 20.00  . Pack years: 20.00  . Types: Cigarettes  . Last attempt to quit: 01/04/1988  . Years since quitting: 29.3  Smokeless Tobacco Never Used    Goals Met:  Independence with exercise equipment Exercise tolerated well No report of cardiac concerns or symptoms Strength training completed today  Goals Unmet:  Not Applicable  Comments: Pt able to follow exercise prescription today without complaint.  Will continue to monitor for progression.   Dr. Emily Filbert is Medical Director for [place] and [facility].

## 2017-05-04 DIAGNOSIS — Z79899 Other long term (current) drug therapy: Secondary | ICD-10-CM | POA: Diagnosis not present

## 2017-05-04 DIAGNOSIS — E7800 Pure hypercholesterolemia, unspecified: Secondary | ICD-10-CM | POA: Diagnosis not present

## 2017-05-04 DIAGNOSIS — I10 Essential (primary) hypertension: Secondary | ICD-10-CM | POA: Diagnosis not present

## 2017-05-04 DIAGNOSIS — E538 Deficiency of other specified B group vitamins: Secondary | ICD-10-CM | POA: Diagnosis not present

## 2017-05-05 DIAGNOSIS — Z87891 Personal history of nicotine dependence: Secondary | ICD-10-CM | POA: Diagnosis not present

## 2017-05-05 DIAGNOSIS — K219 Gastro-esophageal reflux disease without esophagitis: Secondary | ICD-10-CM | POA: Diagnosis not present

## 2017-05-05 DIAGNOSIS — J986 Disorders of diaphragm: Secondary | ICD-10-CM | POA: Diagnosis not present

## 2017-05-05 DIAGNOSIS — Z8546 Personal history of malignant neoplasm of prostate: Secondary | ICD-10-CM | POA: Diagnosis not present

## 2017-05-05 DIAGNOSIS — J449 Chronic obstructive pulmonary disease, unspecified: Secondary | ICD-10-CM

## 2017-05-05 DIAGNOSIS — Z86010 Personal history of colonic polyps: Secondary | ICD-10-CM | POA: Diagnosis not present

## 2017-05-05 DIAGNOSIS — Z79899 Other long term (current) drug therapy: Secondary | ICD-10-CM | POA: Diagnosis not present

## 2017-05-05 NOTE — Progress Notes (Signed)
Daily Session Note  Patient Details  Name: Wesley Preston MRN: [id number] Date of Birth: [birth year] Referring Provider:     Pulmonary Rehab from 04/17/2017 in [facility] Cardiac and Pulmonary Rehab  Referring Provider  Merton Border MD      Encounter Date: 05/05/2017  Check In: Session Check In - 05/05/17 1021      Check-In   Location  [facility]-Cardiac & Pulmonary Rehab    Staff Present  Justin Mend RCP,RRT,BSRT;Amanda Oletta Darter, BA, ACSM CEP, Exercise Physiologist;Meredith Sherryll Burger, RN BSN    Supervising physician immediately available to respond to emergencies  [facility] immediately available ER MD    Physician(s)  Dr. Alfred Levins and [facility]    Medication changes reported      No    Fall or balance concerns reported     No    Tobacco Cessation  No Change    Warm-up and Cool-down  Performed as group-led instruction    Resistance Training Performed  No patient late    VAD Patient?  No      Pain Assessment   Currently in Pain?  No/denies          Social History   Tobacco Use  Smoking Status Former Smoker  . Packs/day: 1.00  . Years: 20.00  . Pack years: 20.00  . Types: Cigarettes  . Last attempt to quit: 01/04/1988  . Years since quitting: 29.3  Smokeless Tobacco Never Used    Goals Met:  Independence with exercise equipment Exercise tolerated well No report of cardiac concerns or symptoms Strength training completed today  Goals Unmet:  Not Applicable  Comments: Pt able to follow exercise prescription today without complaint.  Will continue to monitor for progression.   Dr. Emily Filbert is Medical Director for [place] and [facility].

## 2017-05-08 ENCOUNTER — Ambulatory Visit: Payer: Medicare HMO | Admitting: Pulmonary Disease

## 2017-05-08 DIAGNOSIS — J449 Chronic obstructive pulmonary disease, unspecified: Secondary | ICD-10-CM

## 2017-05-08 NOTE — Progress Notes (Signed)
Pulmonary Individual Treatment Plan  Patient Details  Name: Wesley Preston MRN: [id number] Date of Birth: [birth year] Referring Provider:     Pulmonary Rehab from 04/17/2017 in [facility] Cardiac and Pulmonary Rehab  Referring Provider  Merton Border MD      Initial Encounter Date:    Pulmonary Rehab from 04/17/2017 in [facility] Cardiac and Pulmonary Rehab  Date  04/17/17  Referring Provider  Merton Border MD      Visit Diagnosis: COPD, mild ([place])  Patient's Home Medications on Admission:  Current Outpatient Medications:  .  doxazosin (CARDURA) 4 MG tablet, , Disp: , Rfl:  .  gabapentin (NEURONTIN) 100 MG capsule, TAKE 1 CAPSULE (100 MG TOTAL) BY MOUTH 3 (THREE) TIMES DAILY., Disp: , Rfl: 3 .  NASACORT ALLERGY 24HR 55 MCG/ACT AERO nasal inhaler, , Disp: , Rfl:  .  primidone (MYSOLINE) 50 MG tablet, Take 1 tablet by mouth daily., Disp: , Rfl:  .  propranolol ER (INDERAL LA) 60 MG 24 hr capsule, Take 60 mg by mouth daily. , Disp: , Rfl:  .  umeclidinium-vilanterol (ANORO ELLIPTA) 62.5-25 MCG/INH AEPB, Inhale 1 puff into the lungs daily., Disp: 180 each, Rfl: 2 .  VENTOLIN HFA 108 (90 Base) MCG/ACT inhaler, INHALE 2 PUFFS INTO THE LUNGS EVERY 6 (SIX) HOURS AS NEEDED FOR WHEEZING OR SHORTNESS OF BREATH., Disp: 1 Inhaler, Rfl: 0  Past Medical History: Past Medical History:  Diagnosis Date  . Anxiety   . Back pain   . BPH (benign prostatic hyperplasia)   . Cancer (HCC)    BASAL CELL  . Chest pain, non-cardiac   . Colon polyps   . COPD (chronic obstructive pulmonary disease) ([place])   . Depression   . Essential tremor   . Essential tremor   . Family history of adverse reaction to anesthesia    DAUGHTERS GET NAUSEATED  . GERD (gastroesophageal reflux disease)    H/O  . Helicobacter pylori (H. pylori) infection   . Hyperlipidemia   . Hypertension   . Neuromuscular disorder ([place])    TREMORS  . Reactive airway disease   . Reactive airway disease   . Restless leg syndrome     Tobacco  Use: Social History   Tobacco Use  Smoking Status Former Smoker  . Packs/day: 1.00  . Years: 20.00  . Pack years: 20.00  . Types: Cigarettes  . Last attempt to quit: 01/04/1988  . Years since quitting: 29.3  Smokeless Tobacco Never Used    Labs: Recent Review Flowsheet Data    There is no flowsheet data to display.       Pulmonary Assessment Scores: Pulmonary Assessment Scores    Row Name 04/17/17 1501         ADL UCSD   ADL Phase  Entry     SOB Score total  37     Rest  1     Walk  2     Stairs  3     Bath  1     Dress  1     Shop  2       CAT Score   CAT Score  10       mMRC Score   mMRC Score  1        Pulmonary Function Assessment: Pulmonary Function Assessment - 04/17/17 1504      Pulmonary Function Tests   FVC%  75 % Test performed on 10/30/13    FEV1%  68 %  FEV1/FVC Ratio  71      Breath   Bilateral Breath Sounds  Clear;Decreased    Shortness of Breath  No;Limiting activity       Exercise Target Goals:    Exercise Program Goal: Individual exercise prescription set using results from initial 6 min walk test and THRR while considering  patient's activity barriers and safety.    Exercise Prescription Goal: Initial exercise prescription builds to 30-45 minutes a day of aerobic activity, 2-3 days per week.  Home exercise guidelines will be given to patient during program as part of exercise prescription that the participant will acknowledge.  Activity Barriers & Risk Stratification: Activity Barriers & Cardiac Risk Stratification - 04/17/17 1623      Activity Barriers & Cardiac Risk Stratification   Activity Barriers  Deconditioning;Muscular Weakness;Shortness of Breath       6 Minute Walk: 6 Minute Walk    Row Name 04/17/17 1621         6 Minute Walk   Phase  Initial     Distance  1185 feet     Walk Time  6 minutes     # of Rest Breaks  0     MPH  2.24     METS  2.1     RPE  9     Perceived Dyspnea   1     VO2 Peak  7.36      Symptoms  No     Resting HR  68 bpm     Resting BP  122/64     Resting Oxygen Saturation   94 %     Exercise Oxygen Saturation  during 6 min walk  90 %     Max Ex. HR  78 bpm     Max Ex. BP  126/64     2 Minute Post BP  122/60       Interval HR   6 Minute HR  78     2 Minute Post HR  74     Interval Heart Rate?  Yes       Interval Oxygen   Interval Oxygen?  Yes     Baseline Oxygen Saturation %  94 %     1 Minute Oxygen Saturation %  90 %     1 Minute Liters of Oxygen  0 L Room Air     2 Minute Oxygen Saturation %  90 %     2 Minute Liters of Oxygen  0 L     3 Minute Oxygen Saturation %  90 %     3 Minute Liters of Oxygen  0 L     4 Minute Oxygen Saturation %  90 %     4 Minute Liters of Oxygen  0 L     5 Minute Oxygen Saturation %  90 %     5 Minute Liters of Oxygen  0 L     6 Minute Oxygen Saturation %  91 %     6 Minute Liters of Oxygen  0 L     2 Minute Post Oxygen Saturation %  94 %     2 Minute Post Liters of Oxygen  0 L       Oxygen Initial Assessment: Oxygen Initial Assessment - 04/17/17 1505      Home Oxygen   Home Oxygen Device  None    Sleep Oxygen Prescription  None    Home Exercise Oxygen Prescription  None  Home at Rest Exercise Oxygen Prescription  None      Initial 6 min Walk   Oxygen Used  None      Program Oxygen Prescription   Program Oxygen Prescription  None      Intervention   Short Term Goals  To learn and demonstrate proper use of respiratory medications;To learn and understand importance of maintaining oxygen saturations>88%;To learn and demonstrate proper pursed lip breathing techniques or other breathing techniques.;To learn and understand importance of monitoring SPO2 with pulse oximeter and demonstrate accurate use of the pulse oximeter.    Long  Term Goals  Verbalizes importance of monitoring SPO2 with pulse oximeter and return demonstration;Maintenance of O2 saturations>88%;Exhibits proper breathing techniques, such as pursed lip  breathing or other method taught during program session;Compliance with respiratory medication;Demonstrates proper use of MDI's       Oxygen Re-Evaluation: Oxygen Re-Evaluation    Row Name 04/19/17 1010             Program Oxygen Prescription   Program Oxygen Prescription  None         Home Oxygen   Home Oxygen Device  None       Sleep Oxygen Prescription  None       Home Exercise Oxygen Prescription  None       Home at Rest Exercise Oxygen Prescription  None         Goals/Expected Outcomes   Short Term Goals  To learn and demonstrate proper use of respiratory medications;To learn and understand importance of maintaining oxygen saturations>88%;To learn and demonstrate proper pursed lip breathing techniques or other breathing techniques.;To learn and understand importance of monitoring SPO2 with pulse oximeter and demonstrate accurate use of the pulse oximeter.       Long  Term Goals  Verbalizes importance of monitoring SPO2 with pulse oximeter and return demonstration;Maintenance of O2 saturations>88%;Exhibits proper breathing techniques, such as pursed lip breathing or other method taught during program session;Compliance with respiratory medication;Demonstrates proper use of MDI's       Comments  Reviewed PLB technique with pt.  Talked about how it work and it's important to maintaining his exercise saturations.         Goals/Expected Outcomes  Short: Become more profiecient at using PLB.   Long: Become independent at using PLB.          Oxygen Discharge (Final Oxygen Re-Evaluation): Oxygen Re-Evaluation - 04/19/17 1010      Program Oxygen Prescription   Program Oxygen Prescription  None      Home Oxygen   Home Oxygen Device  None    Sleep Oxygen Prescription  None    Home Exercise Oxygen Prescription  None    Home at Rest Exercise Oxygen Prescription  None      Goals/Expected Outcomes   Short Term Goals  To learn and demonstrate proper use of respiratory medications;To  learn and understand importance of maintaining oxygen saturations>88%;To learn and demonstrate proper pursed lip breathing techniques or other breathing techniques.;To learn and understand importance of monitoring SPO2 with pulse oximeter and demonstrate accurate use of the pulse oximeter.    Long  Term Goals  Verbalizes importance of monitoring SPO2 with pulse oximeter and return demonstration;Maintenance of O2 saturations>88%;Exhibits proper breathing techniques, such as pursed lip breathing or other method taught during program session;Compliance with respiratory medication;Demonstrates proper use of MDI's    Comments  Reviewed PLB technique with pt.  Talked about how it work and it's important to maintaining  his exercise saturations.      Goals/Expected Outcomes  Short: Become more profiecient at using PLB.   Long: Become independent at using PLB.       Initial Exercise Prescription: Initial Exercise Prescription - 04/17/17 1600      Date of Initial Exercise RX and Referring Provider   Date  04/17/17    Referring Provider  Merton Border MD      Treadmill   MPH  1.4    Grade  0.5    Minutes  15    METs  2.17      Recumbant Elliptical   Level  1    RPM  50    Minutes  15    METs  2      T5 Nustep   Level  1    SPM  80    Minutes  15    METs  2      Prescription Details   Frequency (times per week)  3    Duration  Progress to 45 minutes of aerobic exercise without signs/symptoms of physical distress      Intensity   THRR 40-80% of Max Heartrate  98-128    Ratings of Perceived Exertion  11-13    Perceived Dyspnea  0-4      Progression   Progression  Continue to progress workloads to maintain intensity without signs/symptoms of physical distress.      Resistance Training   Training Prescription  Yes    Weight  3 lbs    Reps  10-15       Perform Capillary Blood Glucose checks as needed.  Exercise Prescription Changes: Exercise Prescription Changes    Row Name  04/17/17 1600 04/26/17 1100 04/27/17 1000         Response to Exercise   Blood Pressure (Admit)  122/64  -  116/60     Blood Pressure (Exercise)  126/64  -  -     Blood Pressure (Exit)  122/60  -  106/64     Heart Rate (Admit)  68 bpm  -  67 bpm     Heart Rate (Exercise)  78 bpm  -  90 bpm     Heart Rate (Exit)  74 bpm  -  67 bpm     Oxygen Saturation (Admit)  94 %  -  95 %     Oxygen Saturation (Exercise)  90 %  -  93 %     Oxygen Saturation (Exit)  94 %  -  93 %     Rating of Perceived Exertion (Exercise)  9  -  11     Perceived Dyspnea (Exercise)  1  -  1     Symptoms  none  -  none     Comments  walk test results  -  -     Duration  -  -  Continue with 45 min of aerobic exercise without signs/symptoms of physical distress.     Intensity  -  -  THRR unchanged       Progression   Progression  -  -  Continue to progress workloads to maintain intensity without signs/symptoms of physical distress.     Average METs  -  -  1.95       Resistance Training   Training Prescription  -  -  Yes     Weight  -  -  3 lb     Reps  -  -  10-15       Recumbant Elliptical   Level  -  -  2     RPM  -  -  48     Minutes  -  -  15     METs  -  -  2.1       T5 Nustep   Level  -  -  2     SPM  -  -  80     Minutes  -  -  15     METs  -  -  1.8       Home Exercise Plan   Plans to continue exercise at  [facility] (comment) [employer] sneakers at [employer], [employer] (comment) [employer]     Frequency  -  Add 1 additional day to program exercise sessions.  Add 1 additional day to program exercise sessions.     Initial Home Exercises Provided  -  04/26/17  04/26/17        Exercise Comments: Exercise Comments    Row Name 04/19/17 1009 04/26/17 1125         Exercise Comments  First full day of exercise!  Patient was oriented to gym and equipment including functions, settings, policies, and procedures.  Patient's individual exercise prescription and  treatment plan were reviewed.  All starting workloads were established based on the results of the 6 minute walk test done at initial orientation visit.  The plan for exercise progression was also introduced and progression will be customized based on patient's performance and goals.  .Reviewed home exercise with pt today.  Pt plans to go to [facility] for exercise.  Reviewed THR, pulse, RPE, sign and symptoms, NTG use, and when to call 911 or MD.  Also discussed weather considerations and indoor options.  Pt voiced understanding.         Exercise Goals and Review: Exercise Goals    Row Name 04/17/17 1625             Exercise Goals   Increase Physical Activity  Yes       Intervention  Develop an individualized exercise prescription for aerobic and resistive training based on initial evaluation findings, risk stratification, comorbidities and participant's personal goals.;Provide advice, education, support and counseling about physical activity/exercise needs.       Expected Outcomes  Short Term: Attend rehab on a regular basis to increase amount of physical activity.;Long Term: Add in home exercise to make exercise part of routine and to increase amount of physical activity.;Long Term: Exercising regularly at least 3-5 days a week.       Increase Strength and Stamina  Yes       Intervention  Provide advice, education, support and counseling about physical activity/exercise needs.;Develop an individualized exercise prescription for aerobic and resistive training based on initial evaluation findings, risk stratification, comorbidities and participant's personal goals.       Expected Outcomes  Short Term: Increase workloads from initial exercise prescription for resistance, speed, and METs.;Short Term: Perform resistance training exercises routinely during rehab and add in resistance training at home;Long Term: Improve cardiorespiratory fitness, muscular endurance and strength as measured by  increased METs and functional capacity (6MWT)       Able to understand and use rate of perceived exertion (RPE) scale  Yes       Intervention  Provide education and explanation on how to use RPE scale  Expected Outcomes  Short Term: Able to use RPE daily in rehab to express subjective intensity level;Long Term:  Able to use RPE to guide intensity level when exercising independently       Able to understand and use Dyspnea scale  Yes       Intervention  Provide education and explanation on how to use Dyspnea scale       Expected Outcomes  Short Term: Able to use Dyspnea scale daily in rehab to express subjective sense of shortness of breath during exertion;Long Term: Able to use Dyspnea scale to guide intensity level when exercising independently       Knowledge and understanding of Target Heart Rate Range (THRR)  Yes       Intervention  Provide education and explanation of THRR including how the numbers were predicted and where they are located for reference       Expected Outcomes  Short Term: Able to state/look up THRR;Short Term: Able to use daily as guideline for intensity in rehab;Long Term: Able to use THRR to govern intensity when exercising independently       Able to check pulse independently  Yes       Intervention  Provide education and demonstration on how to check pulse in carotid and radial arteries.;Review the importance of being able to check your own pulse for safety during independent exercise       Expected Outcomes  Long Term: Able to check pulse independently and accurately;Short Term: Able to explain why pulse checking is important during independent exercise       Understanding of Exercise Prescription  Yes       Intervention  Provide education, explanation, and written materials on patient's individual exercise prescription       Expected Outcomes  Short Term: Able to explain program exercise prescription;Long Term: Able to explain home exercise prescription to exercise  independently          Exercise Goals Re-Evaluation : Exercise Goals Re-Evaluation    Row Name 04/19/17 1009 04/27/17 1027           Exercise Goal Re-Evaluation   Exercise Goals Review  Understanding of Exercise Prescription;Able to understand and use Dyspnea scale;Knowledge and understanding of Target Heart Rate Range (THRR);Able to understand and use rate of perceived exertion (RPE) scale  Increase Physical Activity;Able to understand and use rate of perceived exertion (RPE) scale;Able to understand and use Dyspnea scale;Increase Strength and Stamina      Comments  Reviewed RPE scale, THR and program prescription with pt today.  Pt voiced understanding and was given a copy of goals to take home.   Pt is tolerating exercise well.  Staff will monitor progress      Expected Outcomes  Short: Use RPE daily to regulate intensity.  Long: Follow program prescription in THR.  Short - Pt will attend regularly Long - Pt will exercise independently         Discharge Exercise Prescription (Final Exercise Prescription Changes): Exercise Prescription Changes - 04/27/17 1000      Response to Exercise   Blood Pressure (Admit)  116/60    Blood Pressure (Exit)  106/64    Heart Rate (Admit)  67 bpm    Heart Rate (Exercise)  90 bpm    Heart Rate (Exit)  67 bpm    Oxygen Saturation (Admit)  95 %    Oxygen Saturation (Exercise)  93 %    Oxygen Saturation (Exit)  93 %  Rating of Perceived Exertion (Exercise)  11    Perceived Dyspnea (Exercise)  1    Symptoms  none    Duration  Continue with 45 min of aerobic exercise without signs/symptoms of physical distress.    Intensity  THRR unchanged      Progression   Progression  Continue to progress workloads to maintain intensity without signs/symptoms of physical distress.    Average METs  1.95      Resistance Training   Training Prescription  Yes    Weight  3 lb    Reps  10-15      Recumbant Elliptical   Level  2    RPM  48    Minutes  15     METs  2.1      T5 Nustep   Level  2    SPM  80    Minutes  15    METs  1.8      Home Exercise Plan   Plans to continue exercise at  [facility] (comment) [employer]    Frequency  Add 1 additional day to program exercise sessions.    Initial Home Exercises Provided  04/26/17       Nutrition:  Target Goals: Understanding of nutrition guidelines, daily intake of sodium <1564m, cholesterol <2021m calories 30% from fat and 7% or less from saturated fats, daily to have 5 or more servings of fruits and vegetables.  Biometrics: Pre Biometrics - 04/17/17 1626      Pre Biometrics   Height  5' 10"  (1.778 m)    Weight  193 lb 8 oz (87.8 kg)    Waist Circumference  37 inches    Hip Circumference  42 inches    Waist to Hip Ratio  0.88 %    BMI (Calculated)  27.76        Nutrition Therapy Plan and Nutrition Goals: Nutrition Therapy & Goals - 04/26/17 1028      Nutrition Therapy   Diet  TLC    Protein (specify units)  12oz    Fiber  30 grams    Whole Grain Foods  3 servings    Saturated Fats  16 max. grams    Fruits and Vegetables  6 servings/day 8 ideal    Sodium  2000 grams      Personal Nutrition Goals   Nutrition Goal  Pay closer attention to the amount of sodium you consume daily. Try not to add salt at meal times ; use herbs, spices, citrus and other salt-free seasonings to flavor your food instead. This helps limit/prevent fluid retention    Personal Goal #2  Limit fried foods, particularly when eating out. Fried foods are high in sodium and trans/saturated fat    Personal Goal #3  Continue to cut down on the amount of calories consumed from alcohol (wine) each week    Personal Goal #4  Drink more water, aiming for eight, 8oz glasses per day ideally to help thin mucus and make breathing easier    Comments  He and his wife have been working to include more vegetables daily and to fry less meat at home. Currently he does not monitor sodium intake        Intervention Plan   Intervention  Prescribe, educate and counsel regarding individualized specific dietary modifications aiming towards targeted core components such as weight, hypertension, lipid management, diabetes, heart failure and other comorbidities.;Nutrition handout(s) given to patient. Nutrition Guidelines for COPD  Expected Outcomes  Short Term Goal: Understand basic principles of dietary content, such as calories, fat, sodium, cholesterol and nutrients.;Short Term Goal: A plan has been developed with personal nutrition goals set during dietitian appointment.;Long Term Goal: Adherence to prescribed nutrition plan.       Nutrition Assessments: Nutrition Assessments - 04/17/17 1500      MEDFICTS Scores   Pre Score  76       Nutrition Goals Re-Evaluation: Nutrition Goals Re-Evaluation    Row Name 04/26/17 1037             Goals   Nutrition Goal  Pay closer attention to the amount of sodium you consume daily. Try not to add salt at meal times ; use herbs, spices, citrus and other salt-free seasonings to flavor your food instead. This helps limit/prevent fluid retention       Comment  He currently does not monitor or limit his sodium intake       Expected Outcome  He and his wife will experiment more with sodium-free flavorings at meal times. He will consume less salt overall         Personal Goal #2 Re-Evaluation   Personal Goal #2  Continue to cut down on the amount of calories consumed from alcohol (wine) each week         Personal Goal #3 Re-Evaluation   Personal Goal #3  Limit fried foods, particularly when eating out. Fried foods are high in sodium and trans/saturated fat         Personal Goal #4 Re-Evaluation   Personal Goal #4  Drink more water, aiming for eight, 8oz glasses per day ideally to help thin mucus and make breathing easier          Nutrition Goals Discharge (Final Nutrition Goals Re-Evaluation): Nutrition Goals Re-Evaluation - 04/26/17 1037       Goals   Nutrition Goal  Pay closer attention to the amount of sodium you consume daily. Try not to add salt at meal times ; use herbs, spices, citrus and other salt-free seasonings to flavor your food instead. This helps limit/prevent fluid retention    Comment  He currently does not monitor or limit his sodium intake    Expected Outcome  He and his wife will experiment more with sodium-free flavorings at meal times. He will consume less salt overall      Personal Goal #2 Re-Evaluation   Personal Goal #2  Continue to cut down on the amount of calories consumed from alcohol (wine) each week      Personal Goal #3 Re-Evaluation   Personal Goal #3  Limit fried foods, particularly when eating out. Fried foods are high in sodium and trans/saturated fat      Personal Goal #4 Re-Evaluation   Personal Goal #4  Drink more water, aiming for eight, 8oz glasses per day ideally to help thin mucus and make breathing easier       Psychosocial: Target Goals: Acknowledge presence or absence of significant depression and/or stress, maximize coping skills, provide positive support system. Participant is able to verbalize types and ability to use techniques and skills needed for reducing stress and depression.   Initial Review & Psychosocial Screening: Initial Psych Review & Screening - 04/17/17 1456      Initial Review   Current issues with  Current Stress Concerns    Source of Stress Concerns  Chronic Illness    Comments  COPD and normal stress of day to day. 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      Family Dynamics   Good Support System?  Yes    Comments  He can look to his wife and daughters for support.      Barriers   Psychosocial barriers to participate in program  The patient should benefit from training in stress management and relaxation.      Screening Interventions   Interventions  Encouraged to exercise;Program counselor consult;To provide support and  resources with identified psychosocial needs;Provide feedback about the scores to participant    Expected Outcomes  Short Term goal: Utilizing psychosocial counselor, staff and physician to assist with identification of specific Stressors or current issues interfering with healing process. Setting desired goal for each stressor or current issue identified.;Long Term Goal: Stressors or current issues are controlled or eliminated.;Short Term goal: Identification and review with participant of any Quality of Life or Depression concerns found by scoring the questionnaire.;Long Term goal: The participant improves quality of Life and PHQ9 Scores as seen by post scores and/or verbalization of changes       Quality of Life Scores:  Scores of 19 and below usually indicate a poorer quality of life in these areas.  A difference of  2-3 points is a clinically meaningful difference.  A difference of 2-3 points in the total score of the Quality of Life Index has been associated with significant improvement in overall quality of life, self-image, physical symptoms, and general health in studies assessing change in quality of life.  PHQ-9: Recent Review Flowsheet Data    Depression screen [facility] 2/9 04/17/2017   Decreased Interest 1   Down, Depressed, Hopeless 1   PHQ - 2 Score 2   Altered sleeping 2   Tired, decreased energy 2   Change in appetite 1   Feeling bad or failure about yourself  0   Trouble concentrating 2   Moving slowly or fidgety/restless 2   Suicidal thoughts 0   PHQ-9 Score 11   Difficult doing work/chores Somewhat difficult     Interpretation of Total Score  Total Score Depression Severity:  1-4 = Minimal depression, 5-9 = Mild depression, 10-14 = Moderate depression, 15-19 = Moderately severe depression, 20-27 = Severe depression   Psychosocial Evaluation and Intervention: Psychosocial Evaluation - 04/24/17 1109      Psychosocial Evaluation & Interventions   Interventions  Stress  management education;Encouraged to exercise with the program and follow exercise prescription    Comments  Counselor met with Mr. Vanatta Clair Gulling) today for initial psychosocial evaluation.  He is a 77 year old who was diagnosed with COPD ~4 years ago.  He has a strong support system with a spouse of 7 years; and several adult children who are in the medical field.  Clair Gulling reports sleeping well except intermittently due to an enlarged prostate.  He has a good appetite.  Clair Gulling reports a history of depression approximately 10 years ago, but has had no recent symptoms and is typically in a positive mood most of the time.  His stress relates to his health and aging in general.  He has goals to increase his stamina and decrease the progression of the COPD.  He is typically very active and wants to get back to his normal routine.  Staff will follow with him.     Expected Outcomes  Short:  Clair Gulling will begin exercising consistently to help manage his stress.    Long:  Clair Gulling will exercise and attend education  to understand ways to decrease the progression of COPD in his life.    Continue Psychosocial Services   Follow up required by staff       Psychosocial Re-Evaluation:   Psychosocial Discharge (Final Psychosocial Re-Evaluation):   Education: Education Goals: Education classes will be provided on a weekly basis, covering required topics. Participant will state understanding/return demonstration of topics presented.  Learning Barriers/Preferences: Learning Barriers/Preferences - 04/17/17 1505      Learning Barriers/Preferences   Learning Barriers  None    Learning Preferences  None       Education Topics:  Initial Evaluation Education: - Verbal, written and demonstration of respiratory meds, oximetry and breathing techniques. Instruction on use of nebulizers and MDIs and importance of monitoring MDI activations.   Pulmonary Rehab from 05/03/2017 in [facility] Cardiac and Pulmonary Rehab  Date  04/17/17  Educator   [facility]  Instruction Review Code  1- Verbalizes Understanding      General Nutrition Guidelines/Fats and Fiber: -Group instruction provided by verbal, written material, models and posters to present the general guidelines for heart healthy nutrition. Gives an explanation and review of dietary fats and fiber.   Controlling Sodium/Reading Food Labels: -Group verbal and written material supporting the discussion of sodium use in heart healthy nutrition. Review and explanation with models, verbal and written materials for utilization of the food label.   Exercise Physiology & General Exercise Guidelines: - Group verbal and written instruction with models to review the exercise physiology of the cardiovascular system and associated critical values. Provides general exercise guidelines with specific guidelines to those with heart or lung disease.    Aerobic Exercise & Resistance Training: - Gives group verbal and written instruction on the various components of exercise. Focuses on aerobic and resistive training programs and the benefits of this training and how to safely progress through these programs.   Pulmonary Rehab from 05/03/2017 in [facility] Cardiac and Pulmonary Rehab  Date  04/28/17  Educator  [facility]  Instruction Review Code  1- Verbalizes Understanding      Flexibility, Balance, Mind/Body Relaxation: Provides group verbal/written instruction on the benefits of flexibility and balance training, including mind/body exercise modes such as yoga, pilates and tai chi.  Demonstration and skill practice provided.   Stress and Anxiety: - Provides group verbal and written instruction about the health risks of elevated stress and causes of high stress.  Discuss the correlation between heart/lung disease and anxiety and treatment options. Review healthy ways to manage with stress and anxiety.   Depression: - Provides group verbal and written instruction on the correlation between heart/lung disease and  depressed mood, treatment options, and the stigmas associated with seeking treatment.   Exercise & Equipment Safety: - Individual verbal instruction and demonstration of equipment use and safety with use of the equipment.   Pulmonary Rehab from 05/03/2017 in [facility] Cardiac and Pulmonary Rehab  Date  04/17/17  Educator  [facility]  Instruction Review Code  1- Verbalizes Understanding      Infection Prevention: - Provides verbal and written material to individual with discussion of infection control including proper hand washing and proper equipment cleaning during exercise session.   Pulmonary Rehab from 05/03/2017 in [facility] Cardiac and Pulmonary Rehab  Date  04/17/17  Educator  [place]  Instruction Review Code  1- Verbalizes Understanding      Falls Prevention: - Provides verbal and written material to individual with discussion of falls prevention and safety.   Pulmonary Rehab from 05/03/2017 in [facility] Cardiac and  Pulmonary Rehab  Date  04/17/17  Educator  [facility]  Instruction Review Code  1- Verbalizes Understanding      Diabetes: - Individual verbal and written instruction to review signs/symptoms of diabetes, desired ranges of glucose level fasting, after meals and with exercise. Advice that pre and post exercise glucose checks will be done for 3 sessions at entry of program.   Chronic Lung Diseases: - Group verbal and written instruction to review updates, respiratory medications, advancements in procedures and treatments. Discuss use of supplemental oxygen including available portable oxygen systems, continuous and intermittent flow rates, concentrators, personal use and safety guidelines. Review proper use of inhaler and spacers. Provide informative websites for self-education.    Energy Conservation: - Provide group verbal and written instruction for methods to conserve energy, plan and organize activities. Instruct on pacing techniques, use of adaptive equipment and posture/positioning to relieve  shortness of breath.   Triggers and Exacerbations: - Group verbal and written instruction to review types of environmental triggers and ways to prevent exacerbations. Discuss weather changes, air quality and the benefits of nasal washing. Review warning signs and symptoms to help prevent infections. Discuss techniques for effective airway clearance, coughing, and vibrations.   Pulmonary Rehab from 05/03/2017 in [facility] Cardiac and Pulmonary Rehab  Date  04/26/17  Educator  [facility]  Instruction Review Code  1- Verbalizes Understanding      AED/CPR: - Group verbal and written instruction with the use of models to demonstrate the basic use of the AED with the basic ABC's of resuscitation.   Anatomy and Physiology of the Lungs: - Group verbal and written instruction with the use of models to provide basic lung anatomy and physiology related to function, structure and complications of lung disease.   Anatomy & Physiology of the Heart: - Group verbal and written instruction and models provide basic cardiac anatomy and physiology, with the coronary electrical and arterial systems. Review of Valvular disease and Heart Failure   Pulmonary Rehab from 05/03/2017 in [facility] Cardiac and Pulmonary Rehab  Date  05/03/17  Educator  [facility]  Instruction Review Code  1- Verbalizes Understanding      Cardiac Medications: - Group verbal and written instruction to review commonly prescribed medications for heart disease. Reviews the medication, class of the drug, and side effects.   Know Your Numbers and Risk Factors: -Group verbal and written instruction about important numbers in your health.  Discussion of what are risk factors and how they play a role in the disease process.  Review of Cholesterol, Blood Pressure, Diabetes, and BMI and the role they play in your overall health.   Sleep Hygiene: -Provides group verbal and written instruction about how sleep can affect your health.  Define sleep hygiene, discuss  sleep cycles and impact of sleep habits. Review good sleep hygiene tips.    Pulmonary Rehab from 05/03/2017 in [facility] Cardiac and Pulmonary Rehab  Date  04/19/17  Educator  [facility]  Instruction Review Code  1- Verbalizes Understanding      Other: -Provides group and verbal instruction on various topics (see comments)    Knowledge Questionnaire Score: Knowledge Questionnaire Score - 04/17/17 1505      Knowledge Questionnaire Score   Pre Score  13/18 reviewed with patient        Core Components/Risk Factors/Patient Goals at Admission: Personal Goals and Risk Factors at Admission - 04/17/17 1506      Core Components/Risk Factors/Patient Goals on Admission    Weight Management  Yes;Weight Maintenance;Weight  Loss    Intervention  Weight Management: Develop a combined nutrition and exercise program designed to reach desired caloric intake, while maintaining appropriate intake of nutrient and fiber, sodium and fats, and appropriate energy expenditure required for the weight goal.;Weight Management: Provide education and appropriate resources to help participant work on and attain dietary goals.;Weight Management/Obesity: Establish reasonable short term and long term weight goals.    Admit Weight  193 lb 8 oz (87.8 kg)    Goal Weight: Short Term  188 lb (85.3 kg)    Goal Weight: Long Term  180 lb (81.6 kg)    Expected Outcomes  Short Term: Continue to assess and modify interventions until short term weight is achieved;Long Term: Adherence to nutrition and physical activity/exercise program aimed toward attainment of established weight goal;Weight Maintenance: Understanding of the daily nutrition guidelines, which includes 25-35% calories from fat, 7% or less cal from saturated fats, less than 274m cholesterol, less than 1.5gm of sodium, & 5 or more servings of fruits and vegetables daily;Understanding recommendations for meals to include 15-35% energy as protein, 25-35% energy from fat, 35-60% energy  from carbohydrates, less than 2062mof dietary cholesterol, 20-35 gm of total fiber daily;Weight Loss: Understanding of general recommendations for a balanced deficit meal plan, which promotes 1-2 lb weight loss per week and includes a negative energy balance of [phone] kcal/d;Understanding of distribution of calorie intake throughout the day with the consumption of 4-5 meals/snacks    Improve shortness of breath with ADL's  Yes    Intervention  Provide education, individualized exercise plan and daily activity instruction to help decrease symptoms of SOB with activities of daily living.    Expected Outcomes  Short Term: Improve cardiorespiratory fitness to achieve a reduction of symptoms when performing ADLs;Long Term: Be able to perform more ADLs without symptoms or delay the onset of symptoms       Core Components/Risk Factors/Patient Goals Review:    Core Components/Risk Factors/Patient Goals at Discharge (Final Review):    ITP Comments: ITP Comments    Row Name 04/17/17 1441 05/08/17 0821         ITP Comments  Medical Evaluation completed. Chart sent for review and changes to Dr. MaEmily Filbertirector of LuColpDiagnosis can be found in [facility] encounter 11/20/14   30 day review completed. ITP sent to Dr. MaEmily Filbertirector of [facility] SouthContinue with ITP unless changes are made by physician         Comments: 30 day review

## 2017-05-10 NOTE — Progress Notes (Signed)
Opened in error

## 2017-05-12 ENCOUNTER — Encounter: Payer: Medicare HMO | Admitting: *Deleted

## 2017-05-12 DIAGNOSIS — Z86010 Personal history of colonic polyps: Secondary | ICD-10-CM | POA: Diagnosis not present

## 2017-05-12 DIAGNOSIS — J449 Chronic obstructive pulmonary disease, unspecified: Secondary | ICD-10-CM | POA: Diagnosis not present

## 2017-05-12 DIAGNOSIS — Z87891 Personal history of nicotine dependence: Secondary | ICD-10-CM | POA: Diagnosis not present

## 2017-05-12 DIAGNOSIS — K219 Gastro-esophageal reflux disease without esophagitis: Secondary | ICD-10-CM | POA: Diagnosis not present

## 2017-05-12 DIAGNOSIS — Z8546 Personal history of malignant neoplasm of prostate: Secondary | ICD-10-CM | POA: Diagnosis not present

## 2017-05-12 DIAGNOSIS — J986 Disorders of diaphragm: Secondary | ICD-10-CM | POA: Diagnosis not present

## 2017-05-12 DIAGNOSIS — Z79899 Other long term (current) drug therapy: Secondary | ICD-10-CM | POA: Diagnosis not present

## 2017-05-12 NOTE — Progress Notes (Signed)
Daily Session Note  Patient Details  Name: Wesley Preston MRN: [id number] Date of Birth: Feb 24, 1940 Referring Provider:     Pulmonary Rehab from 04/17/2017 in [facility] Cardiac and Pulmonary Rehab  Referring Provider  Merton Border MD      Encounter Date: 05/12/2017  Check In: Session Check In - 05/12/17 1030      Check-In   Location  [facility]-Cardiac & Pulmonary Rehab    Staff Present  Renita Papa, RN Vickki Hearing, BA, ACSM CEP, Exercise Physiologist Constance Goltz RN    Supervising physician immediately available to respond to emergencies  [facility] immediately available ER MD    Physician(s)  Dr. Clearnce Hasten and Alfred Levins    Medication changes reported      No    Fall or balance concerns reported     No    Tobacco Cessation  No Change    Warm-up and Cool-down  Performed as group-led instruction    Resistance Training Performed  Yes    VAD Patient?  No      Pain Assessment   Currently in Pain?  No/denies          Social History   Tobacco Use  Smoking Status Former Smoker  . Packs/day: 1.00  . Years: 20.00  . Pack years: 20.00  . Types: Cigarettes  . Last attempt to quit: 01/04/1988  . Years since quitting: 29.3  Smokeless Tobacco Never Used    Goals Met:  Proper associated with RPD/PD & O2 Sat Independence with exercise equipment Using PLB without cueing & demonstrates good technique Exercise tolerated well No report of cardiac concerns or symptoms Strength training completed today  Goals Unmet:  Not Applicable  Comments: Pt able to follow exercise prescription today without complaint.  Will continue to monitor for progression.    Dr. Emily Filbert is Medical Director for [place] and [facility].

## 2017-05-19 ENCOUNTER — Encounter: Payer: Medicare HMO | Admitting: *Deleted

## 2017-05-19 DIAGNOSIS — J449 Chronic obstructive pulmonary disease, unspecified: Secondary | ICD-10-CM | POA: Diagnosis not present

## 2017-05-19 DIAGNOSIS — K219 Gastro-esophageal reflux disease without esophagitis: Secondary | ICD-10-CM | POA: Diagnosis not present

## 2017-05-19 DIAGNOSIS — Z87891 Personal history of nicotine dependence: Secondary | ICD-10-CM | POA: Diagnosis not present

## 2017-05-19 DIAGNOSIS — Z8546 Personal history of malignant neoplasm of prostate: Secondary | ICD-10-CM | POA: Diagnosis not present

## 2017-05-19 DIAGNOSIS — Z79899 Other long term (current) drug therapy: Secondary | ICD-10-CM | POA: Diagnosis not present

## 2017-05-19 DIAGNOSIS — Z86010 Personal history of colonic polyps: Secondary | ICD-10-CM | POA: Diagnosis not present

## 2017-05-19 DIAGNOSIS — J986 Disorders of diaphragm: Secondary | ICD-10-CM | POA: Diagnosis not present

## 2017-05-19 NOTE — Progress Notes (Signed)
Daily Session Note  Patient Details  Name: RYAN OGBORN MRN: [id number] Date of Birth: Jan 26, 1940 Referring Provider:     Pulmonary Rehab from 04/17/2017 in [facility] Cardiac and Pulmonary Rehab  Referring Provider  Merton Border MD      Encounter Date: 05/19/2017  Check In: Session Check In - 05/19/17 1020      Check-In   Location  [facility]-Cardiac & Pulmonary Rehab    Staff Present  Carson Myrtle, BS, RRT, Respiratory Therapist;Jemiah Cuadra Sherryll Burger, RN Vickki Hearing, BA, ACSM CEP, Exercise Physiologist    Supervising physician immediately available to respond to emergencies  [facility] immediately available ER MD    Physician(s)  Dr. Joni Fears and Corky Downs    Medication changes reported      No    Fall or balance concerns reported     No    Tobacco Cessation  No Change    Warm-up and Cool-down  Performed as group-led instruction    Resistance Training Performed  Yes    VAD Patient?  No      Pain Assessment   Currently in Pain?  No/denies          Social History   Tobacco Use  Smoking Status Former Smoker  . Packs/day: 1.00  . Years: 20.00  . Pack years: 20.00  . Types: Cigarettes  . Last attempt to quit: 01/04/1988  . Years since quitting: 29.3  Smokeless Tobacco Never Used    Goals Met:  Proper associated with RPD/PD & O2 Sat Independence with exercise equipment Using PLB without cueing & demonstrates good technique Exercise tolerated well No report of cardiac concerns or symptoms Strength training completed today  Goals Unmet:  Not Applicable  Comments: Pt able to follow exercise prescription today without complaint.  Will continue to monitor for progression.    Dr. Emily Filbert is Medical Director for [place] and [facility].

## 2017-05-22 ENCOUNTER — Encounter: Payer: Self-pay | Admitting: Pulmonary Disease

## 2017-05-22 ENCOUNTER — Ambulatory Visit: Payer: Medicare HMO | Admitting: Pulmonary Disease

## 2017-05-22 VITALS — BP 122/78 | HR 72 | Ht 70.0 in | Wt 191.0 lb

## 2017-05-22 DIAGNOSIS — R0609 Other forms of dyspnea: Secondary | ICD-10-CM | POA: Diagnosis not present

## 2017-05-22 DIAGNOSIS — R942 Abnormal results of pulmonary function studies: Secondary | ICD-10-CM | POA: Diagnosis not present

## 2017-05-22 DIAGNOSIS — J449 Chronic obstructive pulmonary disease, unspecified: Secondary | ICD-10-CM | POA: Diagnosis not present

## 2017-05-22 NOTE — Progress Notes (Signed)
PULMONARY OFFICE FOLLOW UP NOTE  Initial consultation: 10/29/14 Reason for consultation: Dyspnea  Pt Profile:  77 y.o. 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 BNP ordered which was minimally elevated. Repeat PFTs ordered. Enrolled in pulmonary rehab. No major pulmonary events  SUBJ:  Last seen 3/29.  At that time, I enrolled him in the pulmonary rehabilitation program.  He believes the program has been beneficial to him.  He believes he has improved exercise tolerance.  He has had some increased difficulty recently due to heavy pollens in the air.  Otherwise, denies CP, fever, purulent sputum, hemoptysis, LE edema and calf tenderness.  OBJ: Vitals:   05/22/17 1337 05/22/17 1342  BP:  122/78  Pulse:  72  SpO2:  95%  Weight: 191 lb (86.6 kg)   Height: 5\' 10"  (1.778 m)   RA  Gen: No distress HEENT: NCAT, sclerae white Neck: No LN, no JVD Lungs: Breath sounds diminished in left base, no wheezes or other adventitious sounds Cardiovascular: Regular, soft systolic murmur Abdomen: Soft, NABS Ext: No C/C/E Neuro: No focal deficits   DATA: No flowsheet data found.   No flowsheet data found.   CXR: No recent film available for my review  IMPRESSION:   ICD-10-CM   1. Mild COPD J44.9   2. 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 Moderate DOE R06.09     We reviewed PFTs together and I discussed the implications of the findings of mixed obstruction and restriction.  PLAN: Continue Anoro inhaler Continue albuterol inhaler as needed for increased shortness of breath, cough, wheezing, chest tightness Continue excellent efforts with pulmonary rehab program I have provided a spacer device to be used with the albuterol inhaler Follow-up in 4 to 6 months   Merton Border, MD PCCM service Mobile [phone] Pager [phone] 05/22/2017 1:46 PM

## 2017-05-22 NOTE — Patient Instructions (Addendum)
Continue Anoro inhaler Continue albuterol inhaler as needed for increased shortness of breath, cough, wheezing, chest tightness Continue your excellent efforts with pulmonary rehab program I have provided a spacer device to be used with the albuterol inhaler Follow-up in 4 to 6 months

## 2017-05-24 ENCOUNTER — Encounter: Payer: Medicare HMO | Admitting: *Deleted

## 2017-05-24 DIAGNOSIS — Z8546 Personal history of malignant neoplasm of prostate: Secondary | ICD-10-CM | POA: Diagnosis not present

## 2017-05-24 DIAGNOSIS — K219 Gastro-esophageal reflux disease without esophagitis: Secondary | ICD-10-CM | POA: Diagnosis not present

## 2017-05-24 DIAGNOSIS — J449 Chronic obstructive pulmonary disease, unspecified: Secondary | ICD-10-CM | POA: Diagnosis not present

## 2017-05-24 DIAGNOSIS — Z87891 Personal history of nicotine dependence: Secondary | ICD-10-CM | POA: Diagnosis not present

## 2017-05-24 DIAGNOSIS — Z79899 Other long term (current) drug therapy: Secondary | ICD-10-CM | POA: Diagnosis not present

## 2017-05-24 DIAGNOSIS — Z86010 Personal history of colonic polyps: Secondary | ICD-10-CM | POA: Diagnosis not present

## 2017-05-24 DIAGNOSIS — J986 Disorders of diaphragm: Secondary | ICD-10-CM | POA: Diagnosis not present

## 2017-05-24 NOTE — Progress Notes (Signed)
Daily Session Note  Patient Details  Name: ALEXXANDER KURT MRN: [id number] Date of Birth: May 01, 1940 Referring Provider:     Pulmonary Rehab from 04/17/2017 in [facility] Cardiac and Pulmonary Rehab  Referring Provider  Merton Border MD      Encounter Date: 05/24/2017  Check In: Session Check In - 05/24/17 1023      Check-In   Location  [facility]-Cardiac & Pulmonary Rehab    Staff Present  Alberteen Sam, MA, RCEP, CCRP, Exercise Physiologist;Kiril Hippe Sherryll Burger, RN Vickki Hearing, BA, ACSM CEP, Exercise Physiologist    Supervising physician immediately available to respond to emergencies  [facility] immediately available ER MD    Physician(s)  Dr. Alfred Levins and Mariea Clonts    Medication changes reported      No    Fall or balance concerns reported     No    Tobacco Cessation  No Change    Warm-up and Cool-down  Performed as group-led instruction    Resistance Training Performed  Yes    VAD Patient?  No      Pain Assessment   Currently in Pain?  No/denies          Social History   Tobacco Use  Smoking Status Former Smoker  . Packs/day: 1.00  . Years: 20.00  . Pack years: 20.00  . Types: Cigarettes  . Last attempt to quit: 01/04/1988  . Years since quitting: 29.4  Smokeless Tobacco Never Used    Goals Met:  Proper associated with RPD/PD & O2 Sat Independence with exercise equipment Using PLB without cueing & demonstrates good technique Exercise tolerated well No report of cardiac concerns or symptoms Strength training completed today  Goals Unmet:  Not Applicable  Comments: Pt able to follow exercise prescription today without complaint.  Will continue to monitor for progression.    Dr. Emily Filbert is Medical Director for [place] and [facility].

## 2017-05-26 ENCOUNTER — Encounter: Payer: Medicare HMO | Admitting: *Deleted

## 2017-05-26 DIAGNOSIS — N401 Enlarged prostate with lower urinary tract symptoms: Secondary | ICD-10-CM | POA: Diagnosis not present

## 2017-05-26 DIAGNOSIS — J4520 Mild intermittent asthma, uncomplicated: Secondary | ICD-10-CM | POA: Diagnosis not present

## 2017-05-26 DIAGNOSIS — E538 Deficiency of other specified B group vitamins: Secondary | ICD-10-CM | POA: Diagnosis not present

## 2017-05-26 DIAGNOSIS — Z23 Encounter for immunization: Secondary | ICD-10-CM | POA: Diagnosis not present

## 2017-05-26 DIAGNOSIS — Z79899 Other long term (current) drug therapy: Secondary | ICD-10-CM | POA: Diagnosis not present

## 2017-05-26 DIAGNOSIS — E7800 Pure hypercholesterolemia, unspecified: Secondary | ICD-10-CM | POA: Diagnosis not present

## 2017-05-26 DIAGNOSIS — Z125 Encounter for screening for malignant neoplasm of prostate: Secondary | ICD-10-CM | POA: Diagnosis not present

## 2017-05-26 DIAGNOSIS — I10 Essential (primary) hypertension: Secondary | ICD-10-CM | POA: Diagnosis not present

## 2017-05-26 DIAGNOSIS — K219 Gastro-esophageal reflux disease without esophagitis: Secondary | ICD-10-CM | POA: Diagnosis not present

## 2017-05-26 DIAGNOSIS — G250 Essential tremor: Secondary | ICD-10-CM | POA: Diagnosis not present

## 2017-05-26 DIAGNOSIS — Z87891 Personal history of nicotine dependence: Secondary | ICD-10-CM | POA: Diagnosis not present

## 2017-05-26 DIAGNOSIS — J449 Chronic obstructive pulmonary disease, unspecified: Secondary | ICD-10-CM

## 2017-05-26 DIAGNOSIS — Z86010 Personal history of colonic polyps: Secondary | ICD-10-CM | POA: Diagnosis not present

## 2017-05-26 DIAGNOSIS — Z8546 Personal history of malignant neoplasm of prostate: Secondary | ICD-10-CM | POA: Diagnosis not present

## 2017-05-26 DIAGNOSIS — J986 Disorders of diaphragm: Secondary | ICD-10-CM | POA: Diagnosis not present

## 2017-05-26 NOTE — Progress Notes (Signed)
Daily Session Note  Patient Details  Name: Wesley Preston MRN: [id number] Date of Birth: 12/24/1940 Referring Provider:     Pulmonary Rehab from 04/17/2017 in [facility] Cardiac and Pulmonary Rehab  Referring Provider  Merton Border MD      Encounter Date: 05/26/2017  Check In: Session Check In - 05/26/17 1023      Check-In   Location  [facility]-Cardiac & Pulmonary Rehab    Staff Present  Darel Hong, RN BSN;Nana Addai, RN Vickki Hearing, BA, ACSM CEP, Exercise Physiologist    Supervising physician immediately available to respond to emergencies  [facility] immediately available ER MD    Physician(s)  Drs. Malinda and Williams    Medication changes reported      No    Fall or balance concerns reported     No    Warm-up and Cool-down  Performed as group-led Higher education careers adviser Performed  Yes    VAD Patient?  No      Pain Assessment   Currently in Pain?  No/denies          Social History   Tobacco Use  Smoking Status Former Smoker  . Packs/day: 1.00  . Years: 20.00  . Pack years: 20.00  . Types: Cigarettes  . Last attempt to quit: 01/04/1988  . Years since quitting: 29.4  Smokeless Tobacco Never Used    Goals Met:  Proper associated with RPD/PD & O2 Sat Independence with exercise equipment Using PLB without cueing & demonstrates good technique Exercise tolerated well Personal goals reviewed No report of cardiac concerns or symptoms Strength training completed today  Goals Unmet:  Not Applicable  Comments: Pt able to follow exercise prescription today without complaint.  Will continue to monitor for progression. See ITP for goal review    Dr. Emily Filbert is Medical Director for [place] and [facility].

## 2017-05-31 DIAGNOSIS — Z86010 Personal history of colonic polyps: Secondary | ICD-10-CM | POA: Diagnosis not present

## 2017-05-31 DIAGNOSIS — J449 Chronic obstructive pulmonary disease, unspecified: Secondary | ICD-10-CM | POA: Diagnosis not present

## 2017-05-31 DIAGNOSIS — Z79899 Other long term (current) drug therapy: Secondary | ICD-10-CM | POA: Diagnosis not present

## 2017-05-31 DIAGNOSIS — Z8546 Personal history of malignant neoplasm of prostate: Secondary | ICD-10-CM | POA: Diagnosis not present

## 2017-05-31 DIAGNOSIS — Z87891 Personal history of nicotine dependence: Secondary | ICD-10-CM | POA: Diagnosis not present

## 2017-05-31 DIAGNOSIS — J986 Disorders of diaphragm: Secondary | ICD-10-CM | POA: Diagnosis not present

## 2017-05-31 DIAGNOSIS — K219 Gastro-esophageal reflux disease without esophagitis: Secondary | ICD-10-CM | POA: Diagnosis not present

## 2017-05-31 NOTE — Progress Notes (Signed)
Daily Session Note  Patient Details  Name: Wesley Preston MRN: [id number] Date of Birth: [birth year] Referring Provider:     Pulmonary Rehab from 04/17/2017 in [facility]  Referring Provider  Merton Border MD      Encounter Date: 05/31/2017  Check In: Session Check In - 05/31/17 1125      Check-In   Location  [facility]-Cardiac & Pulmonary Rehab    Staff Present  Justin Mend Lorre Nick, MA, RCEP, CCRP, Exercise Physiologist;Amanda Oletta Darter, [place], ACSM CEP, Exercise Physiologist    Supervising physician immediately available to respond to emergencies  [facility] immediately available ER MD    Physician(s)  Dr. Quentin Cornwall and Joni Fears    Medication changes reported      No    Fall or balance concerns reported     No    Tobacco Cessation  No Change    Warm-up and Cool-down  Performed as group-led instruction    Resistance Training Performed  Yes    VAD Patient?  No      Pain Assessment   Currently in Pain?  No/denies          Social History   Tobacco Use  Smoking Status Former Smoker  . Packs/day: 1.00  . Years: 20.00  . Pack years: 20.00  . Types: Cigarettes  . Last attempt to quit: 01/04/1988  . Years since quitting: 29.4  Smokeless Tobacco Never Used    Goals Met:  Independence with exercise equipment Exercise tolerated well No report of cardiac concerns or symptoms Strength training completed today  Goals Unmet:  Not Applicable  Comments: Pt able to follow exercise prescription today without complaint.  Will continue to monitor for progression.   Dr. Emily Filbert is Medical Director for [place] and [facility].

## 2017-06-02 ENCOUNTER — Encounter: Payer: Medicare HMO | Admitting: *Deleted

## 2017-06-02 DIAGNOSIS — J449 Chronic obstructive pulmonary disease, unspecified: Secondary | ICD-10-CM

## 2017-06-02 DIAGNOSIS — Z86010 Personal history of colonic polyps: Secondary | ICD-10-CM | POA: Diagnosis not present

## 2017-06-02 DIAGNOSIS — J986 Disorders of diaphragm: Secondary | ICD-10-CM | POA: Diagnosis not present

## 2017-06-02 DIAGNOSIS — K219 Gastro-esophageal reflux disease without esophagitis: Secondary | ICD-10-CM | POA: Diagnosis not present

## 2017-06-02 DIAGNOSIS — Z87891 Personal history of nicotine dependence: Secondary | ICD-10-CM | POA: Diagnosis not present

## 2017-06-02 DIAGNOSIS — E538 Deficiency of other specified B group vitamins: Secondary | ICD-10-CM | POA: Diagnosis not present

## 2017-06-02 DIAGNOSIS — Z8546 Personal history of malignant neoplasm of prostate: Secondary | ICD-10-CM | POA: Diagnosis not present

## 2017-06-02 DIAGNOSIS — Z79899 Other long term (current) drug therapy: Secondary | ICD-10-CM | POA: Diagnosis not present

## 2017-06-02 NOTE — Progress Notes (Signed)
Daily Session Note  Patient Details  Name: Wesley Preston MRN: [id number] Date of Birth: April 22, 1940 Referring Provider:     Pulmonary Rehab from 04/17/2017 in [facility] Card[facility] and Pulmonary Rehab  Referring Provider  Merton Border MD      Encounter Date: 06/02/2017  Check In: Session Check In - 06/02/17 1017      Check-In   Location  [facility]    Staff Present  Joellyn Rued, BS, PEC;Meredith Malta, RN Vickki Hearing, BA, ACSM CEP, Exercise Physiologist    Supervising physician immediately available to respond to emergencies  [facility] immediately available ER MD    Physician(s)  Dr. Kerman Passey and Corky Downs    Medication changes reported      No    Fall or balance concerns reported     No    Tobacco Cessation  No Change    Warm-up and Cool-down  Performed as group-led instruction    Resistance Training Performed  Yes    VAD Patient?  No      Pain Assessment   Currently in Pain?  No/denies          Social History   Tobacco Use  Smoking Status Former Smoker  . Packs/day: 1.00  . Years: 20.00  . Pack years: 20.00  . Types: Cigarettes  . Last attempt to quit: 01/04/1988  . Years since quitting: 29.4  Smokeless Tobacco Never Used    Goals Met:  Proper associated with RPD/PD & O2 Sat Independence with exercise equipment Using PLB without cueing & demonstrates good technique Exercise tolerated well No report of cardiac concerns or symptoms Strength training completed today  Goals Unmet:  Not Applicable  Comments: Pt able to follow exercise prescription today without complaint.  Will continue to monitor for progression.    Dr. Emily Filbert is Medical Director for [place] and [facility].

## 2017-06-05 ENCOUNTER — Encounter: Payer: Medicare HMO | Attending: Pulmonary Disease

## 2017-06-05 DIAGNOSIS — J449 Chronic obstructive pulmonary disease, unspecified: Secondary | ICD-10-CM | POA: Insufficient documentation

## 2017-06-05 DIAGNOSIS — Z8546 Personal history of malignant neoplasm of prostate: Secondary | ICD-10-CM | POA: Insufficient documentation

## 2017-06-05 DIAGNOSIS — Z79899 Other long term (current) drug therapy: Secondary | ICD-10-CM | POA: Insufficient documentation

## 2017-06-05 DIAGNOSIS — K219 Gastro-esophageal reflux disease without esophagitis: Secondary | ICD-10-CM | POA: Insufficient documentation

## 2017-06-05 DIAGNOSIS — Z86010 Personal history of colonic polyps: Secondary | ICD-10-CM | POA: Insufficient documentation

## 2017-06-05 DIAGNOSIS — J986 Disorders of diaphragm: Secondary | ICD-10-CM | POA: Insufficient documentation

## 2017-06-05 DIAGNOSIS — Z87891 Personal history of nicotine dependence: Secondary | ICD-10-CM | POA: Insufficient documentation

## 2017-06-05 NOTE — Progress Notes (Signed)
Pulmonary Individual Treatment Plan  Patient Details  Name: Wesley Preston MRN: [id number] Date of Birth: February 11, 1940 Referring Provider:     Pulmonary Rehab from 04/17/2017 in [facility] Cardiac and Pulmonary Rehab  Referring Provider  Merton Border MD      Initial Encounter Date:    Pulmonary Rehab from 04/17/2017 in [facility] Cardiac and Pulmonary Rehab  Date  04/17/17  Referring Provider  Merton Border MD      Visit Diagnosis: COPD, mild ([place])  Patient's Home Medications on Admission:  Current Outpatient Medications:  .  doxazosin (CARDURA) 4 MG tablet, , Disp: , Rfl:  .  gabapentin (NEURONTIN) 100 MG capsule, TAKE 1 CAPSULE (100 MG TOTAL) BY MOUTH 3 (THREE) TIMES DAILY., Disp: , Rfl: 3 .  NASACORT ALLERGY 24HR 55 MCG/ACT AERO nasal inhaler, , Disp: , Rfl:  .  primidone (MYSOLINE) 50 MG tablet, Take 1 tablet by mouth 3 (three) times daily. , Disp: , Rfl:  .  propranolol ER (INDERAL LA) 60 MG 24 hr capsule, Take 60 mg by mouth daily. , Disp: , Rfl:  .  umeclidinium-vilanterol (ANORO ELLIPTA) 62.5-25 MCG/INH AEPB, Inhale 1 puff into the lungs daily., Disp: 180 each, Rfl: 2 .  VENTOLIN HFA 108 (90 Base) MCG/ACT inhaler, INHALE 2 PUFFS INTO THE LUNGS EVERY 6 (SIX) HOURS AS NEEDED FOR WHEEZING OR SHORTNESS OF BREATH., Disp: 1 Inhaler, Rfl: 0  Past Medical History: Past Medical History:  Diagnosis Date  . Anxiety   . Back pain   . BPH (benign prostatic hyperplasia)   . Cancer (HCC)    BASAL CELL  . Chest pain, non-cardiac   . Colon polyps   . COPD (chronic obstructive pulmonary disease) ([place])   . Depression   . Essential tremor   . Essential tremor   . Family history of adverse reaction to anesthesia    DAUGHTERS GET NAUSEATED  . GERD (gastroesophageal reflux disease)    H/O  . Helicobacter pylori (H. pylori) infection   . Hyperlipidemia   . Hypertension   . Neuromuscular disorder ([place])    TREMORS  . Reactive airway disease   . Reactive airway disease   . Restless leg syndrome      Tobacco Use: Social History   Tobacco Use  Smoking Status Former Smoker  . Packs/day: 1.00  . Years: 20.00  . Pack years: 20.00  . Types: Cigarettes  . Last attempt to quit: 01/04/1988  . Years since quitting: 29.4  Smokeless Tobacco Never Used    Labs: Recent Review Flowsheet Data    There is no flowsheet data to display.       Pulmonary Assessment Scores: Pulmonary Assessment Scores    Row Name 04/17/17 1501         ADL UCSD   ADL Phase  Entry     SOB Score total  37     Rest  1     Walk  2     Stairs  3     Bath  1     Dress  1     Shop  2       CAT Score   CAT Score  10       mMRC Score   mMRC Score  1        Pulmonary Function Assessment: Pulmonary Function Assessment - 04/17/17 1504      Pulmonary Function Tests   FVC%  75 % Test performed on 10/30/13    FEV1%  68 %  FEV1/FVC Ratio  71      Breath   Bilateral Breath Sounds  Clear;Decreased    Shortness of Breath  No;Limiting activity       Exercise Target Goals:    Exercise Program Goal: Individual exercise prescription set using results from initial 6 min walk test and THRR while considering  patient's activity barriers and safety.    Exercise Prescription Goal: Initial exercise prescription builds to 30-45 minutes a day of aerobic activity, 2-3 days per week.  Home exercise guidelines will be given to patient during program as part of exercise prescription that the participant will acknowledge.  Activity Barriers & Risk Stratification: Activity Barriers & Cardiac Risk Stratification - 04/17/17 1623      Activity Barriers & Cardiac Risk Stratification   Activity Barriers  Deconditioning;Muscular Weakness;Shortness of Breath       6 Minute Walk: 6 Minute Walk    Row Name 04/17/17 1621         6 Minute Walk   Phase  Initial     Distance  1185 feet     Walk Time  6 minutes     # of Rest Breaks  0     MPH  2.24     METS  2.1     RPE  9     Perceived Dyspnea   1      VO2 Peak  7.36     Symptoms  No     Resting HR  68 bpm     Resting BP  122/64     Resting Oxygen Saturation   94 %     Exercise Oxygen Saturation  during 6 min walk  90 %     Max Ex. HR  78 bpm     Max Ex. BP  126/64     2 Minute Post BP  122/60       Interval HR   6 Minute HR  78     2 Minute Post HR  74     Interval Heart Rate?  Yes       Interval Oxygen   Interval Oxygen?  Yes     Baseline Oxygen Saturation %  94 %     1 Minute Oxygen Saturation %  90 %     1 Minute Liters of Oxygen  0 L Room Air     2 Minute Oxygen Saturation %  90 %     2 Minute Liters of Oxygen  0 L     3 Minute Oxygen Saturation %  90 %     3 Minute Liters of Oxygen  0 L     4 Minute Oxygen Saturation %  90 %     4 Minute Liters of Oxygen  0 L     5 Minute Oxygen Saturation %  90 %     5 Minute Liters of Oxygen  0 L     6 Minute Oxygen Saturation %  91 %     6 Minute Liters of Oxygen  0 L     2 Minute Post Oxygen Saturation %  94 %     2 Minute Post Liters of Oxygen  0 L       Oxygen Initial Assessment: Oxygen Initial Assessment - 04/17/17 1505      Home Oxygen   Home Oxygen Device  None    Sleep Oxygen Prescription  None    Home Exercise Oxygen Prescription  None  Home at Rest Exercise Oxygen Prescription  None      Initial 6 min Walk   Oxygen Used  None      Program Oxygen Prescription   Program Oxygen Prescription  None      Intervention   Short Term Goals  To learn and demonstrate proper use of respiratory medications;To learn and understand importance of maintaining oxygen saturations>88%;To learn and demonstrate proper pursed lip breathing techniques or other breathing techniques.;To learn and understand importance of monitoring SPO2 with pulse oximeter and demonstrate accurate use of the pulse oximeter.    Long  Term Goals  Verbalizes importance of monitoring SPO2 with pulse oximeter and return demonstration;Maintenance of O2 saturations>88%;Exhibits proper breathing techniques,  such as pursed lip breathing or other method taught during program session;Compliance with respiratory medication;Demonstrates proper use of MDI's       Oxygen Re-Evaluation: Oxygen Re-Evaluation    Row Name 04/19/17 1010             Program Oxygen Prescription   Program Oxygen Prescription  None         Home Oxygen   Home Oxygen Device  None       Sleep Oxygen Prescription  None       Home Exercise Oxygen Prescription  None       Home at Rest Exercise Oxygen Prescription  None         Goals/Expected Outcomes   Short Term Goals  To learn and demonstrate proper use of respiratory medications;To learn and understand importance of maintaining oxygen saturations>88%;To learn and demonstrate proper pursed lip breathing techniques or other breathing techniques.;To learn and understand importance of monitoring SPO2 with pulse oximeter and demonstrate accurate use of the pulse oximeter.       Long  Term Goals  Verbalizes importance of monitoring SPO2 with pulse oximeter and return demonstration;Maintenance of O2 saturations>88%;Exhibits proper breathing techniques, such as pursed lip breathing or other method taught during program session;Compliance with respiratory medication;Demonstrates proper use of MDI's       Comments  Reviewed PLB technique with pt.  Talked about how it work and it's important to maintaining his exercise saturations.         Goals/Expected Outcomes  Short: Become more profiecient at using PLB.   Long: Become independent at using PLB.          Oxygen Discharge (Final Oxygen Re-Evaluation): Oxygen Re-Evaluation - 04/19/17 1010      Program Oxygen Prescription   Program Oxygen Prescription  None      Home Oxygen   Home Oxygen Device  None    Sleep Oxygen Prescription  None    Home Exercise Oxygen Prescription  None    Home at Rest Exercise Oxygen Prescription  None      Goals/Expected Outcomes   Short Term Goals  To learn and demonstrate proper use of  respiratory medications;To learn and understand importance of maintaining oxygen saturations>88%;To learn and demonstrate proper pursed lip breathing techniques or other breathing techniques.;To learn and understand importance of monitoring SPO2 with pulse oximeter and demonstrate accurate use of the pulse oximeter.    Long  Term Goals  Verbalizes importance of monitoring SPO2 with pulse oximeter and return demonstration;Maintenance of O2 saturations>88%;Exhibits proper breathing techniques, such as pursed lip breathing or other method taught during program session;Compliance with respiratory medication;Demonstrates proper use of MDI's    Comments  Reviewed PLB technique with pt.  Talked about how it work and it's important to maintaining  his exercise saturations.      Goals/Expected Outcomes  Short: Become more profiecient at using PLB.   Long: Become independent at using PLB.       Initial Exercise Prescription: Initial Exercise Prescription - 04/17/17 1600      Date of Initial Exercise RX and Referring Provider   Date  04/17/17    Referring Provider  Merton Border MD      Treadmill   MPH  1.4    Grade  0.5    Minutes  15    METs  2.17      Recumbant Elliptical   Level  1    RPM  50    Minutes  15    METs  2      T5 Nustep   Level  1    SPM  80    Minutes  15    METs  2      Prescription Details   Frequency (times per week)  3    Duration  Progress to 45 minutes of aerobic exercise without signs/symptoms of physical distress      Intensity   THRR 40-80% of Max Heartrate  98-128    Ratings of Perceived Exertion  11-13    Perceived Dyspnea  0-4      Progression   Progression  Continue to progress workloads to maintain intensity without signs/symptoms of physical distress.      Resistance Training   Training Prescription  Yes    Weight  3 lbs    Reps  10-15       Perform Capillary Blood Glucose checks as needed.  Exercise Prescription Changes: Exercise  Prescription Changes    Row Name 04/17/17 1600 04/26/17 1100 04/27/17 1000 05/10/17 1500 05/24/17 1200     Response to Exercise   Blood Pressure (Admit)  122/64  -  116/60  110/62  116/56   Blood Pressure (Exercise)  126/64  -  -  -  -   Blood Pressure (Exit)  122/60  -  106/64  118/70  124/64   Heart Rate (Admit)  68 bpm  -  67 bpm  87 bpm  76 bpm   Heart Rate (Exercise)  78 bpm  -  90 bpm  85 bpm  84 bpm   Heart Rate (Exit)  74 bpm  -  67 bpm  66 bpm  70 bpm   Oxygen Saturation (Admit)  94 %  -  95 %  94 %  93 %   Oxygen Saturation (Exercise)  90 %  -  93 %  94 %  92 %   Oxygen Saturation (Exit)  94 %  -  93 %  96 %  94 %   Rating of Perceived Exertion (Exercise)  9  -  11  12  11    Perceived Dyspnea (Exercise)  1  -  1  1  3    Symptoms  none  -  none  none  none   Comments  walk test results  -  -  -  -   Duration  -  -  Continue with 45 min of aerobic exercise without signs/symptoms of physical distress.  Continue with 45 min of aerobic exercise without signs/symptoms of physical distress.  Continue with 45 min of aerobic exercise without signs/symptoms of physical distress.   Intensity  -  -  THRR unchanged  THRR unchanged  THRR unchanged     Progression   Progression  -  -  Continue to progress workloads to maintain intensity without signs/symptoms of physical distress.  Continue to progress workloads to maintain intensity without signs/symptoms of physical distress.  Continue to progress workloads to maintain intensity without signs/symptoms of physical distress.   Average METs  -  -  1.95  2.4  2.9     Resistance Training   Training Prescription  -  -  Yes  Yes  Yes   Weight  -  -  3 lb  3 lb  6 lb   Reps  -  -  10-15  10-15  10-15     Interval Training   Interval Training  -  -  -  No  No     Treadmill   MPH  -  -  -  1.4  1.7   Grade  -  -  -  2.5  3   Minutes  -  -  -  15  15   METs  -  -  -  2.5  3     Recumbant Elliptical   Level  -  -  2  3  -   RPM  -  -  48  -   -   Minutes  -  -  15  15  -   METs  -  -  2.1  2.3  -     T5 Nustep   Level  -  -  2  3  5    SPM  -  -  80  88  73   Minutes  -  -  15  15  15    METs  -  -  1.8  2.2  2.8     Home Exercise Plan   Plans to continue exercise at  [facility] (comment) [place] sneakers at [place] (comment) [place] sneakers at [employer] [place] (comment) [employer] sne[employer] at [employer] [place] (comment) [place] [place] [employer] [place]   Frequency  -  Add 1 additional day to program exercise sessions.  Add 1 additional day to program exercise sessions.  Add 1 additional day to program exercise sessions.  Add 1 additional day to program exercise sessions.   Initial Home Exercises Provided  -  04/26/17  04/26/17  04/26/17  04/26/17      Exercise Comments: Exercise Comments    Row Name 04/19/17 1009 04/26/17 1125         Exercise Comments  First full day of exercise!  Patient was oriented to gym and equipment including functions, settings, policies, and procedures.  Patient's individual exercise prescription and treatment plan were reviewed.  All starting workloads were established based on the results of the 6 minute walk test done at initial orientation visit.  The plan for exercise progression was also introduced and progression will be customized based on patient's performance and goals.  .Reviewed home exercise with pt today.  Pt plans to go to [employer] for exercise.  Reviewed THR, pulse, RPE, sign and symptoms, NTG use, and when to call 911 or MD.  Also discussed weather considerations and indoor options.  Pt voiced understanding.         Exercise Goals and Review: Exercise Goals    Row Name 04/17/17 1625             Exercise Goals   Increase Physical Activity  Yes       Intervention  Develop  an individualized exercise prescription for aerobic and resistive training based on initial evaluation findings, risk stratification,  comorbidities and participant's personal goals.;Provide advice, education, support and counseling about physical activity/exercise needs.       Expected Outcomes  Short Term: Attend rehab on a regular basis to increase amount of physical activity.;Long Term: Add in home exercise to make exercise part of routine and to increase amount of physical activity.;Long Term: Exercising regularly at least 3-5 days a week.       Increase Strength and Stamina  Yes       Intervention  Provide advice, education, support and counseling about physical activity/exercise needs.;Develop an individualized exercise prescription for aerobic and resistive training based on initial evaluation findings, risk stratification, comorbidities and participant's personal goals.       Expected Outcomes  Short Term: Increase workloads from initial exercise prescription for resistance, speed, and METs.;Short Term: Perform resistance training exercises routinely during rehab and add in resistance training at home;Long Term: Improve cardiorespiratory fitness, muscular endurance and strength as measured by increased METs and functional capacity (6MWT)       Able to understand and use rate of perceived exertion (RPE) scale  Yes       Intervention  Provide education and explanation on how to use RPE scale       Expected Outcomes  Short Term: Able to use RPE daily in rehab to express subjective intensity level;Long Term:  Able to use RPE to guide intensity level when exercising independently       Able to understand and use Dyspnea scale  Yes       Intervention  Provide education and explanation on how to use Dyspnea scale       Expected Outcomes  Short Term: Able to use Dyspnea scale daily in rehab to express subjective sense of shortness of breath during exertion;Long Term: Able to use Dyspnea scale to guide intensity level when exercising independently       Knowledge and understanding of Target Heart Rate Range (THRR)  Yes        Intervention  Provide education and explanation of THRR including how the numbers were predicted and where they are located for reference       Expected Outcomes  Short Term: Able to state/look up THRR;Short Term: Able to use daily as guideline for intensity in rehab;Long Term: Able to use THRR to govern intensity when exercising independently       Able to check pulse independently  Yes       Intervention  Provide education and demonstration on how to check pulse in carotid and radial arteries.;Review the importance of being able to check your own pulse for safety during independent exercise       Expected Outcomes  Long Term: Able to check pulse independently and accurately;Short Term: Able to explain why pulse checking is important during independent exercise       Understanding of Exercise Prescription  Yes       Intervention  Provide education, explanation, and written materials on patient's individual exercise prescription       Expected Outcomes  Short Term: Able to explain program exercise prescription;Long Term: Able to explain home exercise prescription to exercise independently          Exercise Goals Re-Evaluation : Exercise Goals Re-Evaluation    Row Name 04/19/17 1009 04/27/17 1027 05/10/17 1539 05/24/17 1218       Exercise Goal Re-Evaluation   Exercise Goals Review  Understanding  of Exercise Prescription;Able to understand and use Dyspnea scale;Knowledge and understanding of Target Heart Rate Range (THRR);Able to understand and use rate of perceived exertion (RPE) scale  Increase Physical Activity;Able to understand and use rate of perceived exertion (RPE) scale;Able to understand and use Dyspnea scale;Increase Strength and Stamina  Increase Physical Activity;Able to understand and use rate of perceived exertion (RPE) scale;Knowledge and understanding of Target Heart Rate Range (THRR);Increase Strength and Stamina;Able to understand and use Dyspnea scale  Increase Physical  Activity;Able to understand and use rate of perceived exertion (RPE) scale;Increase Strength and Stamina;Able to understand and use Dyspnea scale    Comments  Reviewed RPE scale, THR and program prescription with pt today.  Pt voiced understanding and was given a copy of goals to take home.   Pt is tolerating exercise well.  Staff will monitor progress  pt has improved MET level since last update.  Staff will monitor progress  Clair Gulling is making steady progress increasing intensity  levels with exercise.  Staff will monitor progress    Expected Outcomes  Short: Use RPE daily to regulate intensity.  Long: Follow program prescription in THR.  Short - Pt will attend regularly Long - Pt will exercise independently  Short - pt will attend LW regularly Long - Pt will improver overall fitness  Short - Clair Gulling will continue to increase levels Long - Clair Gulling will maintain fitness on his own       Discharge Exercise Prescription (Final Exercise Prescription Changes): Exercise Prescription Changes - 05/24/17 1200      Response to Exercise   Blood Pressure (Admit)  116/56    Blood Pressure (Exit)  124/64    Heart Rate (Admit)  76 bpm    Heart Rate (Exercise)  84 bpm    Heart Rate (Exit)  70 bpm    Oxygen Saturation (Admit)  93 %    Oxygen Saturation (Exercise)  92 %    Oxygen Saturation (Exit)  94 %    Rating of Perceived Exertion (Exercise)  11    Perceived Dyspnea (Exercise)  3    Symptoms  none    Duration  Continue with 45 min of aerobic exercise without signs/symptoms of physical distress.    Intensity  THRR unchanged      Progression   Progression  Continue to progress workloads to maintain intensity without signs/symptoms of physical distress.    Average METs  2.9      Resistance Training   Training Prescription  Yes    Weight  6 lb    Reps  10-15      Interval Training   Interval Training  No      Treadmill   MPH  1.7    Grade  3    Minutes  15    METs  3      T5 Nustep   Level  5    SPM   73    Minutes  15    METs  2.8      Home Exercise Plan   Plans to continue exercise at  [facility] (comment) [place]    Frequency  Add 1 additional day to program exercise sessions.    Initial Home Exercises Provided  04/26/17       Nutrition:  Target Goals: Understanding of nutrition guidelines, daily intake of sodium <1543m, cholesterol <2065m calories 30% from fat and 7% or less from saturated fats, daily to have 5 or more servings  of fruits and vegetables.  Biometrics: Pre Biometrics - 04/17/17 1626      Pre Biometrics   Height  5' 10"  (1.778 m)    Weight  193 lb 8 oz (87.8 kg)    Waist Circumference  37 inches    Hip Circumference  42 inches    Waist to Hip Ratio  0.88 %    BMI (Calculated)  27.76        Nutrition Therapy Plan and Nutrition Goals: Nutrition Therapy & Goals - 04/26/17 1028      Nutrition Therapy   Diet  TLC    Protein (specify units)  12oz    Fiber  30 grams    Whole Grain Foods  3 servings    Saturated Fats  16 max. grams    Fruits and Vegetables  6 servings/day 8 ideal    Sodium  2000 grams      Personal Nutrition Goals   Nutrition Goal  Pay closer attention to the amount of sodium you consume daily. Try not to add salt at meal times ; use herbs, spices, citrus and other salt-free seasonings to flavor your food instead. This helps limit/prevent fluid retention    Personal Goal #2  Limit fried foods, particularly when eating out. Fried foods are high in sodium and trans/saturated fat    Personal Goal #3  Continue to cut down on the amount of calories consumed from alcohol (wine) each week    Personal Goal #4  Drink more water, aiming for eight, 8oz glasses per day ideally to help thin mucus and make breathing easier    Comments  He and his wife have been working to include more vegetables daily and to fry less meat at home. Currently he does not monitor sodium intake       Intervention Plan   Intervention   Prescribe, educate and counsel regarding individualized specific dietary modifications aiming towards targeted core components such as weight, hypertension, lipid management, diabetes, heart failure and other comorbidities.;Nutrition handout(s) given to patient. Nutrition Guidelines for COPD    Expected Outcomes  Short Term Goal: Understand basic principles of dietary content, such as calories, fat, sodium, cholesterol and nutrients.;Short Term Goal: A plan has been developed with personal nutrition goals set during dietitian appointment.;Long Term Goal: Adherence to prescribed nutrition plan.       Nutrition Assessments: Nutrition Assessments - 04/17/17 1500      MEDFICTS Scores   Pre Score  76       Nutrition Goals Re-Evaluation: Nutrition Goals Re-Evaluation    Row Name 04/26/17 1037 05/26/17 1035           Goals   Nutrition Goal  Pay closer attention to the amount of sodium you consume daily. Try not to add salt at meal times ; use herbs, spices, citrus and other salt-free seasonings to flavor your food instead. This helps limit/prevent fluid retention  Pt states he is not monitoring sodium at this time.  His wife cooks salt free but he does add salt.  He will try Mrs Deliah Boston instead of slat.       Comment  He currently does not monitor or limit his sodium intake  -      Expected Outcome  He and his wife will experiment more with sodium-free flavorings at meal times. He will consume less salt overall  Short - Pt will try some salt and some salt free seasoning to reduce sodium.  Personal Goal #2 Re-Evaluation   Personal Goal #2  Continue to cut down on the amount of calories consumed from alcohol (wine) each week  Pt has cut back on fried foods.        Personal Goal #3 Re-Evaluation   Personal Goal #3  Limit fried foods, particularly when eating out. Fried foods are high in sodium and trans/saturated fat  Pt has reduced wine from 3-4 glasses 1 1/2 per day.        Personal Goal  #4 Re-Evaluation   Personal Goal #4  Drink more water, aiming for eight, 8oz glasses per day ideally to help thin mucus and make breathing easier  Pt is drinking more water since he is outside a lot.         Nutrition Goals Discharge (Final Nutrition Goals Re-Evaluation): Nutrition Goals Re-Evaluation - 05/26/17 1035      Goals   Nutrition Goal  Pt states he is not monitoring sodium at this time.  His wife cooks salt free but he does add salt.  He will try Mrs Deliah Boston instead of slat.     Expected Outcome  Short - Pt will try some salt and some salt free seasoning to reduce sodium.       Personal Goal #2 Re-Evaluation   Personal Goal #2  Pt has cut back on fried foods.      Personal Goal #3 Re-Evaluation   Personal Goal #3  Pt has reduced wine from 3-4 glasses 1 1/2 per day.      Personal Goal #4 Re-Evaluation   Personal Goal #4  Pt is drinking more water since he is outside a lot.       Psychosocial: Target Goals: Acknowledge presence or absence of significant depression and/or stress, maximize coping skills, provide positive support system. Participant is able to verbalize types and ability to use techniques and skills needed for reducing stress and depression.   Initial Review & Psychosocial Screening: Initial Psych Review & Screening - 04/17/17 1456      Initial Review   Current issues with  Current Stress Concerns    Source of Stress Concerns  Chronic Illness    Comments  COPD and normal stress of day to day. 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      Family Dynamics   Good Support System?  Yes    Comments  He can look to his wife and daughters for support.      Barriers   Psychosocial barriers to participate in program  The patient should benefit from training in stress management and relaxation.      Screening Interventions   Interventions  Encouraged to exercise;Program counselor consult;To provide support and resources with identified  psychosocial needs;Provide feedback about the scores to participant    Expected Outcomes  Short Term goal: Utilizing psychosocial counselor, staff and physician to assist with identification of specific Stressors or current issues interfering with healing process. Setting desired goal for each stressor or current issue identified.;Long Term Goal: Stressors or current issues are controlled or eliminated.;Short Term goal: Identification and review with participant of any Quality of Life or Depression concerns found by scoring the questionnaire.;Long Term goal: The participant improves quality of Life and PHQ9 Scores as seen by post scores and/or verbalization of changes       Quality of Life Scores:  Scores of 19 and below usually indicate a poorer quality of life in these areas.  A difference of  2-3 points is a clinically meaningful difference.  A difference of 2-3 points in the total score of the Quality of Life Index has been associated with significant improvement in overall quality of life, self-image, physical symptoms, and general health in studies assessing change in quality of life.  PHQ-9: Recent Review Flowsheet Data    Depression screen [facility] 2/9 04/17/2017   Decreased Interest 1   Down, Depressed, Hopeless 1   PHQ - 2 Score 2   Altered sleeping 2   Tired, decreased energy 2   Change in appetite 1   Feeling bad or failure about yourself  0   Trouble concentrating 2   Moving slowly or fidgety/restless 2   Suicidal thoughts 0   PHQ-9 Score 11   Difficult doing work/chores Somewhat difficult     Interpretation of Total Score  Total Score Depression Severity:  1-4 = Minimal depression, 5-9 = Mild depression, 10-14 = Moderate depression, 15-19 = Moderately severe depression, 20-27 = Severe depression   Psychosocial Evaluation and Intervention: Psychosocial Evaluation - 04/24/17 1109      Psychosocial Evaluation & Interventions   Interventions  Stress management  education;Encouraged to exercise with the program and follow exercise prescription    Comments  Counselor met with Mr. Bachmeier Clair Gulling) today for initial psychosocial evaluation.  He is a 77 year old who was diagnosed with COPD ~4 years ago.  He has a strong support system with a spouse of 40 years; and several adult children who are in the medical field.  Clair Gulling reports sleeping well except intermittently due to an enlarged prostate.  He has a good appetite.  Clair Gulling reports a history of depression approximately 10 years ago, but has had no recent symptoms and is typically in a positive mood most of the time.  His stress relates to his health and aging in general.  He has goals to increase his stamina and decrease the progression of the COPD.  He is typically very active and wants to get back to his normal routine.  Staff will follow with him.     Expected Outcomes  Short:  Clair Gulling will begin exercising consistently to help manage his stress.    Long:  Clair Gulling will exercise and attend education to understand ways to decrease the progression of COPD in his life.    Continue Psychosocial Services   Follow up required by staff       Psychosocial Re-Evaluation:   Psychosocial Discharge (Final Psychosocial Re-Evaluation):   Education: Education Goals: Education classes will be provided on a weekly basis, covering required topics. Participant will state understanding/return demonstration of topics presented.  Learning Barriers/Preferences: Learning Barriers/Preferences - 04/17/17 1505      Learning Barriers/Preferences   Learning Barriers  None    Learning Preferences  None       Education Topics:  Initial Evaluation Education: - Verbal, written and demonstration of respiratory meds, oximetry and breathing techniques. Instruction on use of nebulizers and MDIs and importance of monitoring MDI activations.   Pulmonary Rehab from 05/31/2017 in [facility] Cardiac and Pulmonary Rehab  Date  04/17/17  Educator  [facility]   Instruction Review Code  1- Verbalizes Understanding      General Nutrition Guidelines/Fats and Fiber: -Group instruction provided by verbal, written material, models and posters to present the general guidelines for heart healthy nutrition. Gives an explanation and review of dietary fats and fiber.   Controlling Sodium/Reading Food Labels: -Group verbal and written material supporting the discussion of sodium use  in heart healthy nutrition. Review and explanation with models, verbal and written materials for utilization of the food label.   Exercise Physiology & General Exercise Guidelines: - Group verbal and written instruction with models to review the exercise physiology of the cardiovascular system and associated critical values. Provides general exercise guidelines with specific guidelines to those with heart or lung disease.    Aerobic Exercise & Resistance Training: - Gives group verbal and written instruction on the various components of exercise. Focuses on aerobic and resistive training programs and the benefits of this training and how to safely progress through these programs.   Pulmonary Rehab from 05/31/2017 in [facility] Cardiac and Pulmonary Rehab  Date  04/28/17  Educator  [facility]  Instruction Review Code  1- Verbalizes Understanding      Flexibility, Balance, Mind/Body Relaxation: Provides group verbal/written instruction on the benefits of flexibility and balance training, including mind/body exercise modes such as yoga, pilates and tai chi.  Demonstration and skill practice provided.   Stress and Anxiety: - Provides group verbal and written instruction about the health risks of elevated stress and causes of high stress.  Discuss the correlation between heart/lung disease and anxiety and treatment options. Review healthy ways to manage with stress and anxiety.   Pulmonary Rehab from 05/31/2017 in [facility] Cardiac and Pulmonary Rehab  Date  05/31/17  Educator  [facility]  Instruction  Review Code  1- Verbalizes Understanding      Depression: - Provides group verbal and written instruction on the correlation between heart/lung disease and depressed mood, treatment options, and the stigmas associated with seeking treatment.   Exercise & Equipment Safety: - Individual verbal instruction and demonstration of equipment use and safety with use of the equipment.   Pulmonary Rehab from 05/31/2017 in [facility] Cardiac and Pulmonary Rehab  Date  04/17/17  Educator  [facility]  Instruction Review Code  1- Verbalizes Understanding      Infection Prevention: - Provides verbal and written material to individual with discussion of infection control including proper hand washing and proper equipment cleaning during exercise session.   Pulmonary Rehab from 05/31/2017 in [facility] Cardiac and Pulmonary Rehab  Date  04/17/17  Educator  [facility]  Instruction Review Code  1- Verbalizes Understanding      Falls Prevention: - Provides verbal and written material to individual with discussion of falls prevention and safety.   Pulmonary Rehab from 05/31/2017 in [facility] Cardiac and Pulmonary Rehab  Date  04/17/17  Educator  [facility]  Instruction Review Code  1- Verbalizes Understanding      Diabetes: - Individual verbal and written instruction to review signs/symptoms of diabetes, desired ranges of glucose level fasting, after meals and with exercise. Advice that pre and post exercise glucose checks will be done for 3 sessions at entry of program.   Chronic Lung Diseases: - Group verbal and written instruction to review updates, respiratory medications, advancements in procedures and treatments. Discuss use of supplemental oxygen including available portable oxygen systems, continuous and intermittent flow rates, concentrators, personal use and safety guidelines. Review proper use of inhaler and spacers. Provide informative websites for self-education.    Energy Conservation: - Provide group verbal and written  instruction for methods to conserve energy, plan and organize activities. Instruct on pacing techniques, use of adaptive equipment and posture/positioning to relieve shortness of breath.   Pulmonary Rehab from 05/31/2017 in [facility] Cardiac and Pulmonary Rehab  Date  05/24/17  Educator  [facility]  Instruction Review Code  1- Verbalizes Understanding  Triggers and Exacerbations: - Group verbal and written instruction to review types of environmental triggers and ways to prevent exacerbations. Discuss weather changes, air quality and the benefits of nasal washing. Review warning signs and symptoms to help prevent infections. Discuss techniques for effective airway clearance, coughing, and vibrations.   Pulmonary Rehab from 05/31/2017 in [facility] Cardiac and Pulmonary Rehab  Date  04/26/17  Educator  [facility]  Instruction Review Code  1- Verbalizes Understanding      AED/CPR: - Group verbal and written instruction with the use of models to demonstrate the basic use of the AED with the basic ABC's of resuscitation.   Pulmonary Rehab from 05/31/2017 in [facility] Cardiac and Pulmonary Rehab  Date  05/26/17  Educator  [place]  Instruction Review Code  1- Verbalizes Understanding      Anatomy and Physiology of the Lungs: - Group verbal and written instruction with the use of models to provide basic lung anatomy and physiology related to function, structure and complications of lung disease.   Anatomy & Physiology of the Heart: - Group verbal and written instruction and models provide basic cardiac anatomy and physiology, with the coronary electrical and arterial systems. Review of Valvular disease and Heart Failure   Pulmonary Rehab from 05/31/2017 in [facility] Cardiac and Pulmonary Rehab  Date  05/03/17  Educator  [facility]  Instruction Review Code  1- Verbalizes Understanding      Cardiac Medications: - Group verbal and written instruction to review commonly prescribed medications for heart disease. Reviews the medication,  class of the drug, and side effects.   Pulmonary Rehab from 05/31/2017 in [facility] Cardiac and Pulmonary Rehab  Date  05/12/17  Educator  [facility]  Instruction Review Code  1- Verbalizes Understanding      Know Your Numbers and Risk Factors: -Group verbal and written instruction about important numbers in your health.  Discussion of what are risk factors and how they play a role in the disease process.  Review of Cholesterol, Blood Pressure, Diabetes, and BMI and the role they play in your overall health.   Sleep Hygiene: -Provides group verbal and written instruction about how sleep can affect your health.  Define sleep hygiene, discuss sleep cycles and impact of sleep habits. Review good sleep hygiene tips.    Pulmonary Rehab from 05/31/2017 in [facility] Cardiac and Pulmonary Rehab  Date  04/19/17  Educator  [facility]  Instruction Review Code  1- Verbalizes Understanding      Other: -Provides group and verbal instruction on various topics (see comments)    Knowledge Questionnaire Score: Knowledge Questionnaire Score - 04/17/17 1505      Knowledge Questionnaire Score   Pre Score  13/18 reviewed with patient        Core Components/Risk Factors/Patient Goals at Admission: Personal Goals and Risk Factors at Admission - 04/17/17 1506      Core Components/Risk Factors/Patient Goals on Admission    Weight Management  Yes;Weight Maintenance;Weight Loss    Intervention  Weight Management: Develop a combined nutrition and exercise program designed to reach desired caloric intake, while maintaining appropriate intake of nutrient and fiber, sodium and fats, and appropriate energy expenditure required for the weight goal.;Weight Management: Provide education and appropriate resources to help participant work on and attain dietary goals.;Weight Management/Obesity: Establish reasonable short term and long term weight goals.    Admit Weight  193 lb 8 oz (87.8 kg)    Goal Weight: Short Term  188 lb (85.3 kg)     Goal  Weight: Long Term  180 lb (81.6 kg)    Expected Outcomes  Short Term: Continue to assess and modify interventions until short term weight is achieved;Long Term: Adherence to nutrition and physical activity/exercise program aimed toward attainment of established weight goal;Weight Maintenance: Understanding of the daily nutrition guidelines, which includes 25-35% calories from fat, 7% or less cal from saturated fats, less than 256m cholesterol, less than 1.5gm of sodium, & 5 or more servings of fruits and vegetables daily;Understanding recommendations for meals to include 15-35% energy as protein, 25-35% energy from fat, 35-60% energy from carbohydrates, less than 2066mof dietary cholesterol, 20-35 gm of total fiber daily;Weight Loss: Understanding of general recommendations for a balanced deficit meal plan, which promotes 1-2 lb weight loss per week and includes a negative energy balance of [phone] kcal/d;Understanding of distribution of calorie intake throughout the day with the consumption of 4-5 meals/snacks    Improve shortness of breath with ADL's  Yes    Intervention  Provide education, individualized exercise plan and daily activity instruction to help decrease symptoms of SOB with activities of daily living.    Expected Outcomes  Short Term: Improve cardiorespiratory fitness to achieve a reduction of symptoms when performing ADLs;Long Term: Be able to perform more ADLs without symptoms or delay the onset of symptoms       Core Components/Risk Factors/Patient Goals Review:  Goals and Risk Factor Review    Row Name 05/26/17 1039             Core Components/Risk Factors/Patient Goals Review   Personal Goals Review  Weight Management/Obesity;Improve shortness of breath with ADL's;Develop more efficient breathing techniques such as purse lipped breathing and diaphragmatic breathing and practicing self-pacing with activity.;Stress;Increase knowledge of respiratory medications and ability  to use respiratory devices properly.       Review  Pt reports less SOB since starting exercise.  He practices PLB.  His only stress is worry about daughters health - who is having a baby in July.  He saw PCP this morning and he added metamucil .       Expected Outcomes  Short - Pt will continue to exercise and take meds as directed Long - Pt will continue healthy habits long term           Core Components/Risk Factors/Patient Goals at Discharge (Final Review):  Goals and Risk Factor Review - 05/26/17 1039      Core Components/Risk Factors/Patient Goals Review   Personal Goals Review  Weight Management/Obesity;Improve shortness of breath with ADL's;Develop more efficient breathing techniques such as purse lipped breathing and diaphragmatic breathing and practicing self-pacing with activity.;Stress;Increase knowledge of respiratory medications and ability to use respiratory devices properly.    Review  Pt reports less SOB since starting exercise.  He practices PLB.  His only stress is worry about daughters health - who is having a baby in July.  He saw PCP this morning and he added metamucil .    Expected Outcomes  Short - Pt will continue to exercise and take meds as directed Long - Pt will continue healthy habits long term        ITP Comments: ITP Comments    Row Name 04/17/17 1441 05/08/17 0821 06/05/17 0817       ITP Comments  Medical Evaluation completed. Chart sent for review and changes to Dr. MaEmily Filbertirector of [place]osis can be found in [facility] encounter 11/20/14   30 day review completed. ITP sent to Dr. MaEmily Filbert  Director of [place]. Continue with ITP unless changes are made by physician   30 day review completed. ITP sent to Dr. Emily Filbert Director of [place]. Continue with ITP unless changes are made by physician        Comments: 30 day review

## 2017-06-12 DIAGNOSIS — J331 Polypoid sinus degeneration: Secondary | ICD-10-CM | POA: Diagnosis not present

## 2017-06-12 DIAGNOSIS — J320 Chronic maxillary sinusitis: Secondary | ICD-10-CM | POA: Diagnosis not present

## 2017-06-12 DIAGNOSIS — J301 Allergic rhinitis due to pollen: Secondary | ICD-10-CM | POA: Diagnosis not present

## 2017-06-19 DIAGNOSIS — J986 Disorders of diaphragm: Secondary | ICD-10-CM | POA: Diagnosis not present

## 2017-06-19 DIAGNOSIS — J449 Chronic obstructive pulmonary disease, unspecified: Secondary | ICD-10-CM

## 2017-06-19 DIAGNOSIS — K219 Gastro-esophageal reflux disease without esophagitis: Secondary | ICD-10-CM | POA: Diagnosis not present

## 2017-06-19 DIAGNOSIS — Z79899 Other long term (current) drug therapy: Secondary | ICD-10-CM | POA: Diagnosis not present

## 2017-06-19 DIAGNOSIS — Z87891 Personal history of nicotine dependence: Secondary | ICD-10-CM | POA: Diagnosis not present

## 2017-06-19 DIAGNOSIS — Z8546 Personal history of malignant neoplasm of prostate: Secondary | ICD-10-CM | POA: Diagnosis not present

## 2017-06-19 DIAGNOSIS — Z86010 Personal history of colonic polyps: Secondary | ICD-10-CM | POA: Diagnosis not present

## 2017-06-19 NOTE — Progress Notes (Signed)
Daily Session Note  Patient Details  Name: Wesley Preston MRN: [id number] Date of Birth: 12/23/1940 Referring Provider:     Pulmonary Rehab from 04/17/2017 in [facility] Cardiac and Pulmonary Rehab  Referring Provider  Merton Border MD      Encounter Date: 06/19/2017  Check In: Session Check In - 06/19/17 1010      Check-In   Location  [facility]-Cardiac & Pulmonary Rehab    Staff Present  Justin Mend RCP,RRT,BSRT;Amanda Oletta Darter, BA, ACSM CEP, Exercise Physiologist;Kelly Amedeo Plenty, BS, ACSM CEP, Exercise Physiologist    Supervising physician immediately available to respond to emergencies  [facility] immediately available ER MD    Physician(s)  Dr. Corky Downs and Clearnce Hasten    Medication changes reported      No    Fall or balance concerns reported     No    Tobacco Cessation  No Change    Warm-up and Cool-down  Performed as group-led instruction    Resistance Training Performed  Yes    VAD Patient?  No      Pain Assessment   Currently in Pain?  No/denies          Social History   Tobacco Use  Smoking Status Former Smoker  . Packs/day: 1.00  . Years: 20.00  . Pack years: 20.00  . Types: Cigarettes  . Last attempt to quit: 01/04/1988  . Years since quitting: 29.4  Smokeless Tobacco Never Used    Goals Met:  Independence with exercise equipment Exercise tolerated well No report of cardiac concerns or symptoms Strength training completed today  Goals Unmet:  Not Applicable  Comments: Pt able to follow exercise prescription today without complaint.  Will continue to monitor for progression.   Dr. Emily Filbert is Medical Director for [place] and [facility].

## 2017-06-21 DIAGNOSIS — K219 Gastro-esophageal reflux disease without esophagitis: Secondary | ICD-10-CM | POA: Diagnosis not present

## 2017-06-21 DIAGNOSIS — Z79899 Other long term (current) drug therapy: Secondary | ICD-10-CM | POA: Diagnosis not present

## 2017-06-21 DIAGNOSIS — J449 Chronic obstructive pulmonary disease, unspecified: Secondary | ICD-10-CM

## 2017-06-21 DIAGNOSIS — J986 Disorders of diaphragm: Secondary | ICD-10-CM | POA: Diagnosis not present

## 2017-06-21 DIAGNOSIS — Z87891 Personal history of nicotine dependence: Secondary | ICD-10-CM | POA: Diagnosis not present

## 2017-06-21 DIAGNOSIS — Z86010 Personal history of colonic polyps: Secondary | ICD-10-CM | POA: Diagnosis not present

## 2017-06-21 DIAGNOSIS — Z8546 Personal history of malignant neoplasm of prostate: Secondary | ICD-10-CM | POA: Diagnosis not present

## 2017-06-21 NOTE — Progress Notes (Signed)
Daily Session Note  Patient Details  Name: Wesley Preston MRN: [id number] Date of Birth: 07-09-1940 Referring Provider:     Pulmonary Rehab from 04/17/2017 in [facility] Cardiac and Pulmonary Rehab  Referring Provider  Merton Border MD      Encounter Date: 06/21/2017  Check In: Session Check In - 06/21/17 1008      Check-In   Location  [facility]-Cardiac & Pulmonary Rehab    Staff Present  Justin Mend Lorre Nick, MA, RCEP, CCRP, Exercise Physiologist;Amanda Oletta Darter, [place], ACSM CEP, Exercise Physiologist    Supervising physician immediately available to respond to emergencies  [facility] immediately available ER MD    Physician(s)  Dr. Jimmye Norman and Corky Downs    Medication changes reported      No    Fall or balance concerns reported     No    Tobacco Cessation  No Change    Warm-up and Cool-down  Performed as group-led instruction    Resistance Training Performed  Yes    VAD Patient?  No      Pain Assessment   Currently in Pain?  No/denies          Social History   Tobacco Use  Smoking Status Former Smoker  . Packs/day: 1.00  . Years: 20.00  . Pack years: 20.00  . Types: Cigarettes  . Last attempt to quit: 01/04/1988  . Years since quitting: 29.4  Smokeless Tobacco Never Used    Goals Met:  Independence with exercise equipment Exercise tolerated well Personal goals reviewed No report of cardiac concerns or symptoms Strength training completed today  Goals Unmet:  Not Applicable  Comments: Pt able to follow exercise prescription today without complaint.  Will continue to monitor for progression.   Dr. Emily Filbert is Medical Director for [place] and [facility].

## 2017-06-28 DIAGNOSIS — Z86010 Personal history of colonic polyps: Secondary | ICD-10-CM | POA: Diagnosis not present

## 2017-06-28 DIAGNOSIS — Z87891 Personal history of nicotine dependence: Secondary | ICD-10-CM | POA: Diagnosis not present

## 2017-06-28 DIAGNOSIS — Z8546 Personal history of malignant neoplasm of prostate: Secondary | ICD-10-CM | POA: Diagnosis not present

## 2017-06-28 DIAGNOSIS — K219 Gastro-esophageal reflux disease without esophagitis: Secondary | ICD-10-CM | POA: Diagnosis not present

## 2017-06-28 DIAGNOSIS — Z79899 Other long term (current) drug therapy: Secondary | ICD-10-CM | POA: Diagnosis not present

## 2017-06-28 DIAGNOSIS — J449 Chronic obstructive pulmonary disease, unspecified: Secondary | ICD-10-CM | POA: Diagnosis not present

## 2017-06-28 DIAGNOSIS — J986 Disorders of diaphragm: Secondary | ICD-10-CM | POA: Diagnosis not present

## 2017-06-28 NOTE — Progress Notes (Signed)
Daily Session Note  Patient Details  Name: Wesley Preston MRN: [id number] Date of Birth: 14-Feb-1940 Referring Provider:     Pulmonary Rehab from 04/17/2017 in [facility] Cardiac and Pulmonary Rehab  Referring Provider  Merton Border MD      Encounter Date: 06/28/2017  Check In: Session Check In - 06/28/17 1020      Check-In   Location  [facility]-Cardiac & Pulmonary Rehab    Staff Present  Justin Mend Lorre Nick, MA, RCEP, CCRP, Exercise Physiologist;Amanda Oletta Darter, [place], ACSM CEP, Exercise Physiologist    Supervising physician immediately available to respond to emergencies  [facility] immediately available ER MD    Physician(s)  Dr. Jimmye Norman and Quentin Cornwall    Medication changes reported      No    Fall or balance concerns reported     No    Tobacco Cessation  No Change    Warm-up and Cool-down  Performed as group-led instruction    Resistance Training Performed  Yes    VAD Patient?  No    PAD/SET Patient?  No      Pain Assessment   Currently in Pain?  No/denies          Social History   Tobacco Use  Smoking Status Former Smoker  . Packs/day: 1.00  . Years: 20.00  . Pack years: 20.00  . Types: Cigarettes  . Last attempt to quit: 01/04/1988  . Years since quitting: 29.5  Smokeless Tobacco Never Used    Goals Met:  Independence with exercise equipment Exercise tolerated well No report of cardiac concerns or symptoms Strength training completed today  Goals Unmet:  Not Applicable  Comments: Pt able to follow exercise prescription today without complaint.  Will continue to monitor for progression.   Dr. Emily Filbert is Medical Director for [place] and [facility].

## 2017-06-30 DIAGNOSIS — J986 Disorders of diaphragm: Secondary | ICD-10-CM | POA: Diagnosis not present

## 2017-06-30 DIAGNOSIS — Z79899 Other long term (current) drug therapy: Secondary | ICD-10-CM | POA: Diagnosis not present

## 2017-06-30 DIAGNOSIS — Z86010 Personal history of colonic polyps: Secondary | ICD-10-CM | POA: Diagnosis not present

## 2017-06-30 DIAGNOSIS — K219 Gastro-esophageal reflux disease without esophagitis: Secondary | ICD-10-CM | POA: Diagnosis not present

## 2017-06-30 DIAGNOSIS — J449 Chronic obstructive pulmonary disease, unspecified: Secondary | ICD-10-CM | POA: Diagnosis not present

## 2017-06-30 DIAGNOSIS — Z8546 Personal history of malignant neoplasm of prostate: Secondary | ICD-10-CM | POA: Diagnosis not present

## 2017-06-30 DIAGNOSIS — Z87891 Personal history of nicotine dependence: Secondary | ICD-10-CM | POA: Diagnosis not present

## 2017-06-30 NOTE — Progress Notes (Signed)
Daily Session Note  Patient Details  Name: Wesley Preston MRN: [id number] Date of Birth: April 21, 1940 Referring Provider:     Pulmonary Rehab from 04/17/2017 in [facility] Cardiac and Pulmonary Rehab  Referring Provider  Merton Border MD      Encounter Date: 06/30/2017  Check In: Session Check In - 06/30/17 1003      Check-In   Location  [facility]-Cardiac & Pulmonary Rehab    Staff Present  Justin Mend Lindell Spar, BA, ACSM CEP, Exercise Physiologist;Meredith Sherryll Burger, RN BSN    Supervising physician immediately available to respond to emergencies  [facility] immediately available ER MD    Physician(s)  Dr. Cherylann Banas and Jacqualine Code    Medication changes reported      No    Fall or balance concerns reported     No    Tobacco Cessation  No Change    Warm-up and Cool-down  Performed as group-led instruction    Resistance Training Performed  Yes    VAD Patient?  No    PAD/SET Patient?  No      Pain Assessment   Currently in Pain?  No/denies          Social History   Tobacco Use  Smoking Status Former Smoker  . Packs/day: 1.00  . Years: 20.00  . Pack years: 20.00  . Types: Cigarettes  . Last attempt to quit: 01/04/1988  . Years since quitting: 29.5  Smokeless Tobacco Never Used    Goals Met:  Independence with exercise equipment Exercise tolerated well No report of cardiac concerns or symptoms Strength training completed today  Goals Unmet:  Not Applicable  Comments: Pt able to follow exercise prescription today without complaint.  Will continue to monitor for progression.   Dr. Emily Filbert is Medical Director for [place] and [facility].

## 2017-07-01 IMAGING — US US CAROTID DUPLEX BILAT
1 series · 13 of 24 positions shown · non-contrast
Comparison: MRI 06/02/2016 .

CLINICAL DATA: CVA.

EXAM:
BILATERAL CAROTID DUPLEX ULTRASOUND
TECHNIQUE: Gray scale imaging, color Doppler and duplex ultrasound were
performed of bilateral carotid and vertebral arteries in the neck.

[Series 1: us carotid duplex bilat · 0.06mm/px · 13 of 63 slices shown]
[im 1/63]
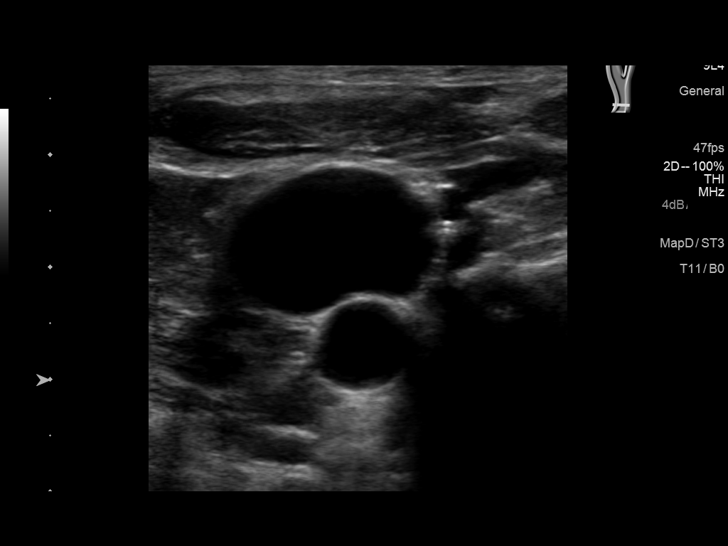
[im 6/63]
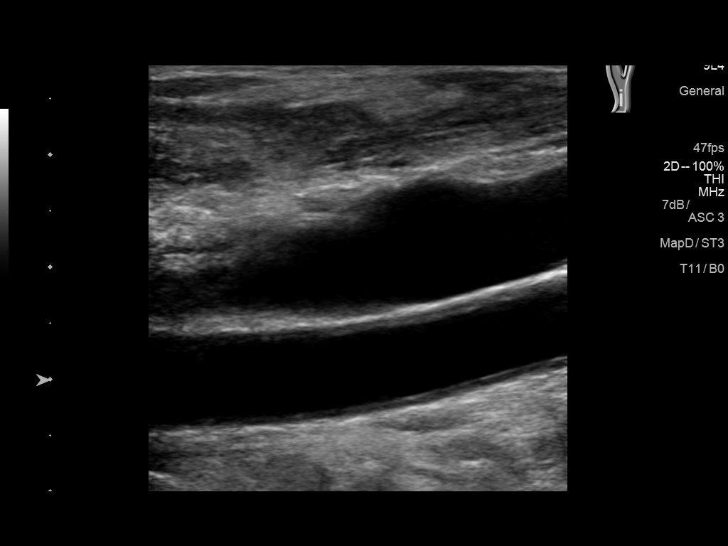
[im 11/63]
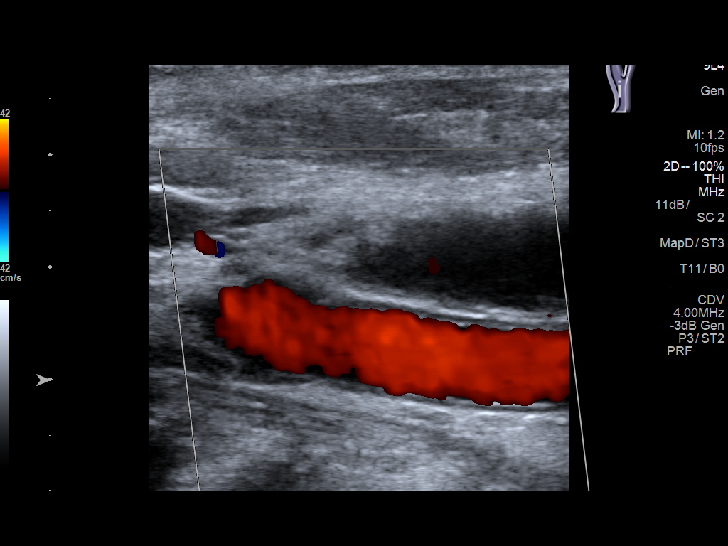
[im 17/63]
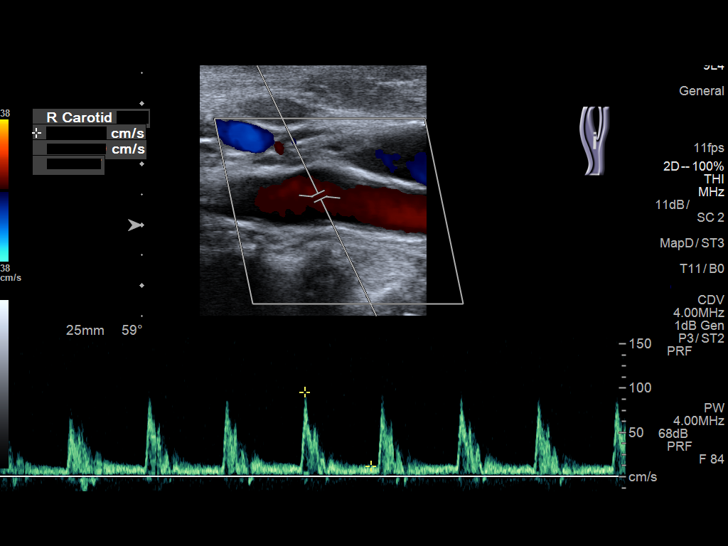
[im 22/63]
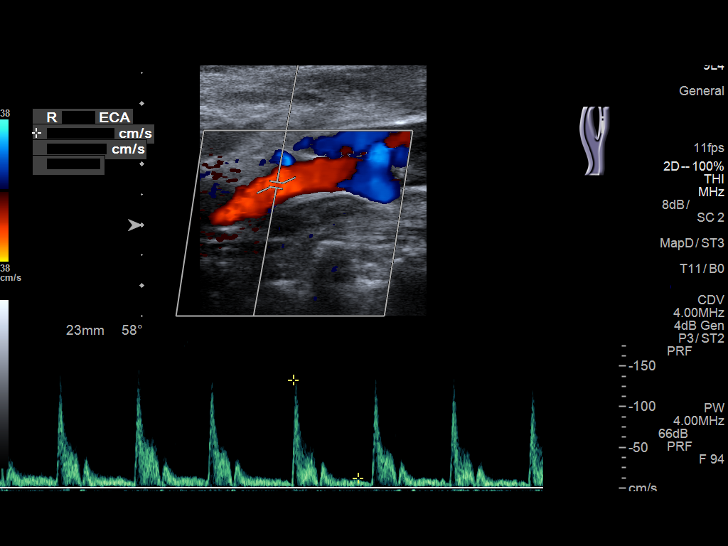
[im 27/63]
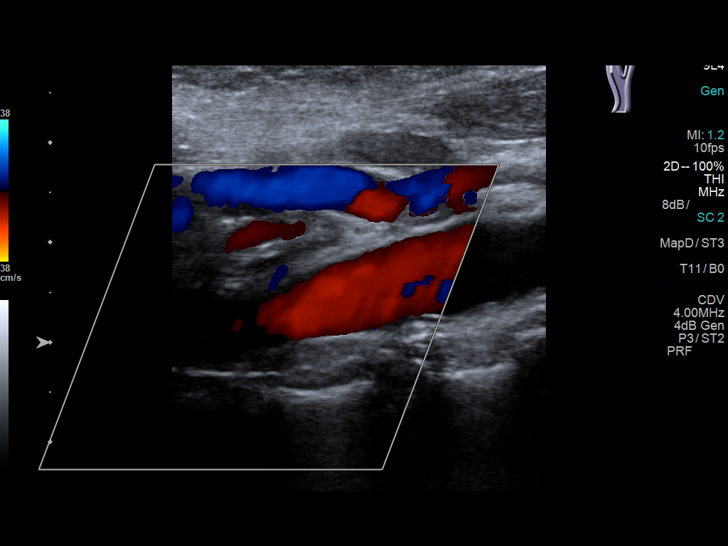
[im 33/63]
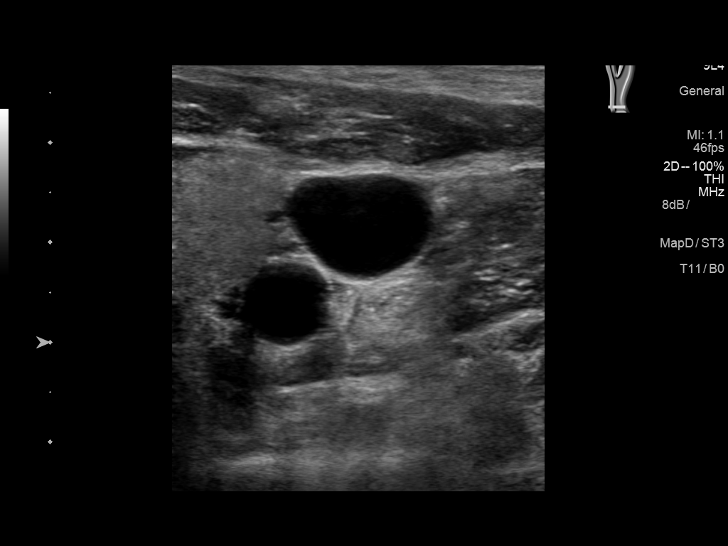
[im 36/63]
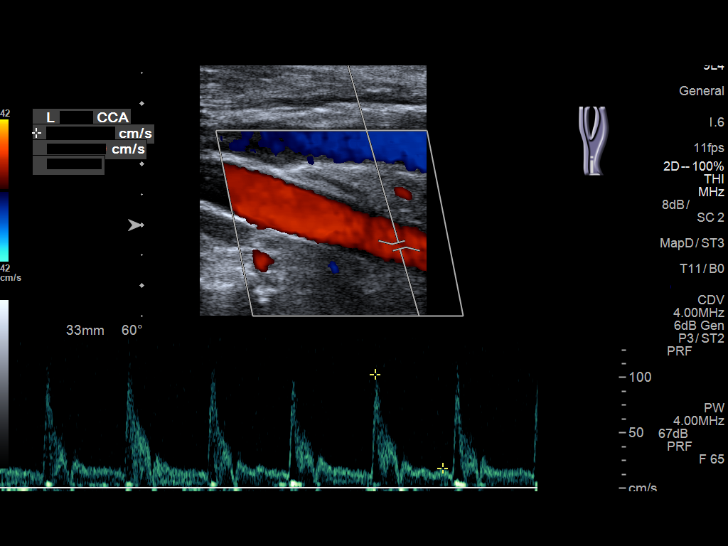
[im 41/63]
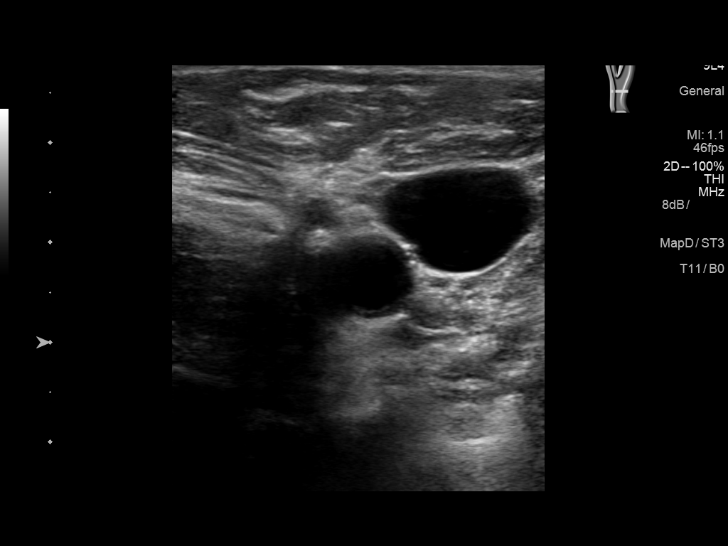
[im 46/63]
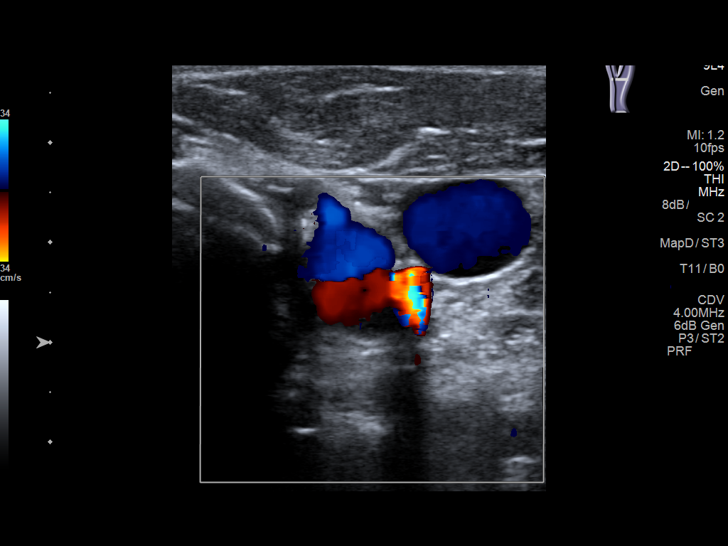
[im 52/63]
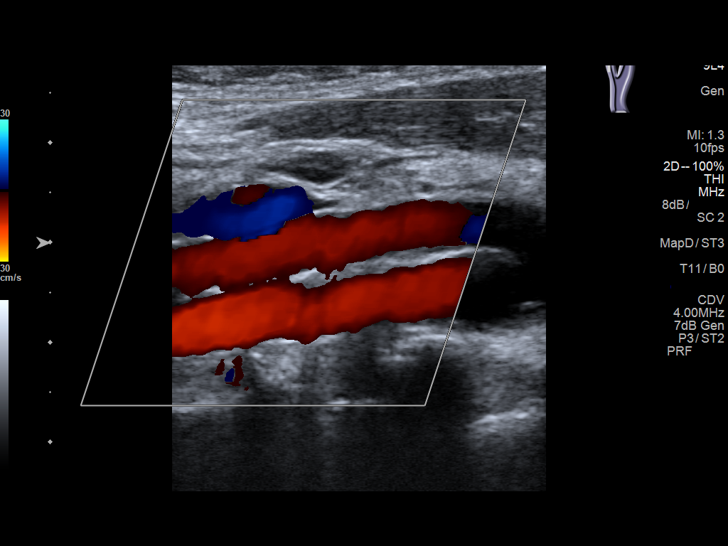
[im 57/63]
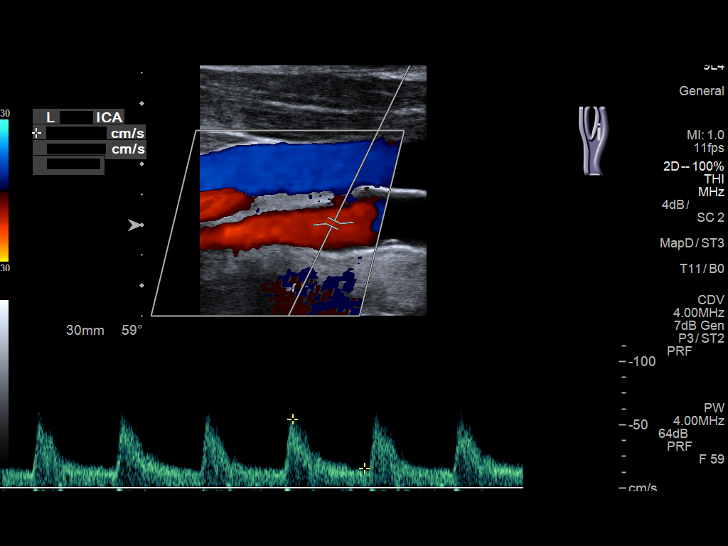
[im 63/63]
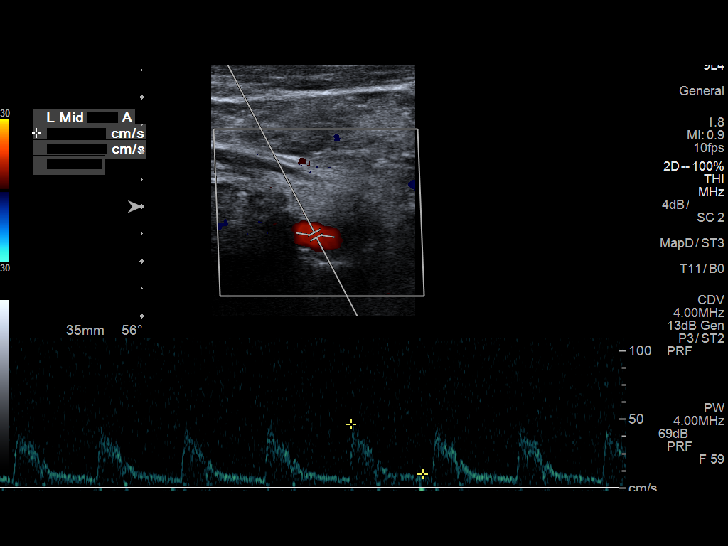

[13 of 24 positions shown; findings below may reference images not displayed]

FINDINGS: Criteria: Quantification of carotid stenosis is based on velocity
parameters that correlate the residual internal carotid diameter
with NASCET-based stenosis levels, using the diameter of the distal
internal carotid lumen as the denominator for stenosis measurement.

The following velocity measurements were obtained:

RIGHT

ICA:  84/8 cm/sec

CCA:  104/12 cm/sec

SYSTOLIC ICA/CCA RATIO:

DIASTOLIC ICA/CCA RATIO:

ECA:  133 cm/sec

LEFT

ICA:  66/19 cm/sec

CCA:  104/14 cm/sec

SYSTOLIC ICA/CCA RATIO:

DIASTOLIC ICA/CCA RATIO:

ECA:  121 cm/sec

RIGHT CAROTID ARTERY: Minimal right carotid bifurcation plaque. No
flow limiting stenosis.

RIGHT VERTEBRAL ARTERY:  Patent with antegrade flow.

LEFT CAROTID ARTERY: Minimal left carotid bifurcation plaque. No
flow limiting stenosis.

LEFT VERTEBRAL ARTERY:  Patent with antegrade flow.
IMPRESSION: 1. Minimal bilateral carotid bifurcation atherosclerotic vascular
plaque. No flow limiting stenosis. Degree of stenosis less than 50%
bilaterally. 2. Vertebrals are patent with antegrade flow.

## 2017-07-03 ENCOUNTER — Encounter: Payer: Medicare HMO | Attending: Pulmonary Disease

## 2017-07-03 DIAGNOSIS — J449 Chronic obstructive pulmonary disease, unspecified: Secondary | ICD-10-CM | POA: Insufficient documentation

## 2017-07-03 DIAGNOSIS — K219 Gastro-esophageal reflux disease without esophagitis: Secondary | ICD-10-CM | POA: Insufficient documentation

## 2017-07-03 DIAGNOSIS — J986 Disorders of diaphragm: Secondary | ICD-10-CM | POA: Insufficient documentation

## 2017-07-03 DIAGNOSIS — Z87891 Personal history of nicotine dependence: Secondary | ICD-10-CM | POA: Insufficient documentation

## 2017-07-03 DIAGNOSIS — Z8546 Personal history of malignant neoplasm of prostate: Secondary | ICD-10-CM | POA: Insufficient documentation

## 2017-07-03 DIAGNOSIS — Z79899 Other long term (current) drug therapy: Secondary | ICD-10-CM | POA: Insufficient documentation

## 2017-07-03 DIAGNOSIS — Z86010 Personal history of colonic polyps: Secondary | ICD-10-CM | POA: Insufficient documentation

## 2017-07-03 NOTE — Progress Notes (Signed)
Pulmonary Individual Treatment Plan  Patient Details  Name: Wesley Preston MRN: [id number] Date of Birth: 01-19-1940 Referring Provider:     Pulmonary Rehab from 04/17/2017 in [facility] Cardiac and Pulmonary Rehab  Referring Provider  Merton Border MD      Initial Encounter Date:    Pulmonary Rehab from 04/17/2017 in [facility] Cardiac and Pulmonary Rehab  Date  04/17/17      Visit Diagnosis: COPD, mild ([place])  Patient's Home Medications on Admission:  Current Outpatient Medications:  .  doxazosin (CARDURA) 4 MG tablet, , Disp: , Rfl:  .  gabapentin (NEURONTIN) 100 MG capsule, TAKE 1 CAPSULE (100 MG TOTAL) BY MOUTH 3 (THREE) TIMES DAILY., Disp: , Rfl: 3 .  NASACORT ALLERGY 24HR 55 MCG/ACT AERO nasal inhaler, , Disp: , Rfl:  .  primidone (MYSOLINE) 50 MG tablet, Take 1 tablet by mouth 3 (three) times daily. , Disp: , Rfl:  .  propranolol ER (INDERAL LA) 60 MG 24 hr capsule, Take 60 mg by mouth daily. , Disp: , Rfl:  .  umeclidinium-vilanterol (ANORO ELLIPTA) 62.5-25 MCG/INH AEPB, Inhale 1 puff into the lungs daily., Disp: 180 each, Rfl: 2 .  VENTOLIN HFA 108 (90 Base) MCG/ACT inhaler, INHALE 2 PUFFS INTO THE LUNGS EVERY 6 (SIX) HOURS AS NEEDED FOR WHEEZING OR SHORTNESS OF BREATH., Disp: 1 Inhaler, Rfl: 0  Past Medical History: Past Medical History:  Diagnosis Date  . Anxiety   . Back pain   . BPH (benign prostatic hyperplasia)   . Cancer (HCC)    BASAL CELL  . Chest pain, non-cardiac   . Colon polyps   . COPD (chronic obstructive pulmonary disease) ([place])   . Depression   . Essential tremor   . Essential tremor   . Family history of adverse reaction to anesthesia    DAUGHTERS GET NAUSEATED  . GERD (gastroesophageal reflux disease)    H/O  . Helicobacter pylori (H. pylori) infection   . Hyperlipidemia   . Hypertension   . Neuromuscular disorder ([place])    TREMORS  . Reactive airway disease   . Reactive airway disease   . Restless leg syndrome     Tobacco Use: Social History    Tobacco Use  Smoking Status Former Smoker  . Packs/day: 1.00  . Years: 20.00  . Pack years: 20.00  . Types: Cigarettes  . Last attempt to quit: 01/04/1988  . Years since quitting: 29.5  Smokeless Tobacco Never Used    Labs: Recent Review Flowsheet Data    There is no flowsheet data to display.       Pulmonary Assessment Scores: Pulmonary Assessment Scores    Row Name 04/17/17 1501         ADL UCSD   ADL Phase  Entry     SOB Score total  37     Rest  1     Walk  2     Stairs  3     Bath  1     Dress  1     Shop  2       CAT Score   CAT Score  10       mMRC Score   mMRC Score  1        Pulmonary Function Assessment: Pulmonary Function Assessment - 04/17/17 1504      Pulmonary Function Tests   FVC%  75 % Test performed on 10/30/13    FEV1%  68 %    FEV1/FVC Ratio  71      Breath   Bilateral Breath Sounds  Clear;Decreased    Shortness of Breath  No;Limiting activity       Exercise Target Goals:    Exercise Program Goal: Individual exercise prescription set using results from initial 6 min walk test and THRR while considering  patient's activity barriers and safety.    Exercise Prescription Goal: Initial exercise prescription builds to 30-45 minutes a day of aerobic activity, 2-3 days per week.  Home exercise guidelines will be given to patient during program as part of exercise prescription that the participant will acknowledge.  Activity Barriers & Risk Stratification: Activity Barriers & Cardiac Risk Stratification - 04/17/17 1623      Activity Barriers & Cardiac Risk Stratification   Activity Barriers  Deconditioning;Muscular Weakness;Shortness of Breath       6 Minute Walk: 6 Minute Walk    Row Name 04/17/17 1621         6 Minute Walk   Phase  Initial     Distance  1185 feet     Walk Time  6 minutes     # of Rest Breaks  0     MPH  2.24     METS  2.1     RPE  9     Perceived Dyspnea   1     VO2 Peak  7.36     Symptoms  No      Resting HR  68 bpm     Resting BP  122/64     Resting Oxygen Saturation   94 %     Exercise Oxygen Saturation  during 6 min walk  90 %     Max Ex. HR  78 bpm     Max Ex. BP  126/64     2 Minute Post BP  122/60       Interval HR   6 Minute HR  78     2 Minute Post HR  74     Interval Heart Rate?  Yes       Interval Oxygen   Interval Oxygen?  Yes     Baseline Oxygen Saturation %  94 %     1 Minute Oxygen Saturation %  90 %     1 Minute Liters of Oxygen  0 L Room Air     2 Minute Oxygen Saturation %  90 %     2 Minute Liters of Oxygen  0 L     3 Minute Oxygen Saturation %  90 %     3 Minute Liters of Oxygen  0 L     4 Minute Oxygen Saturation %  90 %     4 Minute Liters of Oxygen  0 L     5 Minute Oxygen Saturation %  90 %     5 Minute Liters of Oxygen  0 L     6 Minute Oxygen Saturation %  91 %     6 Minute Liters of Oxygen  0 L     2 Minute Post Oxygen Saturation %  94 %     2 Minute Post Liters of Oxygen  0 L       Oxygen Initial Assessment: Oxygen Initial Assessment - 04/17/17 1505      Home Oxygen   Home Oxygen Device  None    Sleep Oxygen Prescription  None    Home Exercise Oxygen Prescription  None    Home at  Rest Exercise Oxygen Prescription  None      Initial 6 min Walk   Oxygen Used  None      Program Oxygen Prescription   Program Oxygen Prescription  None      Intervention   Short Term Goals  To learn and demonstrate proper use of respiratory medications;To learn and understand importance of maintaining oxygen saturations>88%;To learn and demonstrate proper pursed lip breathing techniques or other breathing techniques.;To learn and understand importance of monitoring SPO2 with pulse oximeter and demonstrate accurate use of the pulse oximeter.    Long  Term Goals  Verbalizes importance of monitoring SPO2 with pulse oximeter and return demonstration;Maintenance of O2 saturations>88%;Exhibits proper breathing techniques, such as pursed lip breathing or other  method taught during program session;Compliance with respiratory medication;Demonstrates proper use of MDI's       Oxygen Re-Evaluation: Oxygen Re-Evaluation    Row Name 04/19/17 1010 06/21/17 1038           Program Oxygen Prescription   Program Oxygen Prescription  None  None        Home Oxygen   Home Oxygen Device  None  None      Sleep Oxygen Prescription  None  None      Home Exercise Oxygen Prescription  None  None      Home at Rest Exercise Oxygen Prescription  None  None        Goals/Expected Outcomes   Short Term Goals  To learn and demonstrate proper use of respiratory medications;To learn and understand importance of maintaining oxygen saturations>88%;To learn and demonstrate proper pursed lip breathing techniques or other breathing techniques.;To learn and understand importance of monitoring SPO2 with pulse oximeter and demonstrate accurate use of the pulse oximeter.  To learn and demonstrate proper use of respiratory medications;To learn and understand importance of maintaining oxygen saturations>88%;To learn and demonstrate proper pursed lip breathing techniques or other breathing techniques.;To learn and understand importance of monitoring SPO2 with pulse oximeter and demonstrate accurate use of the pulse oximeter.      Long  Term Goals  Verbalizes importance of monitoring SPO2 with pulse oximeter and return demonstration;Maintenance of O2 saturations>88%;Exhibits proper breathing techniques, such as pursed lip breathing or other method taught during program session;Compliance with respiratory medication;Demonstrates proper use of MDI's  Verbalizes importance of monitoring SPO2 with pulse oximeter and return demonstration;Maintenance of O2 saturations>88%;Exhibits proper breathing techniques, such as pursed lip breathing or other method taught during program session;Compliance with respiratory medication;Demonstrates proper use of MDI's      Comments  Reviewed PLB technique  with pt.  Talked about how it work and it's important to maintaining his exercise saturations.    Wesley Preston does not have to use his rescue inhaler when exercising. He does not check his oxygen at home due to not having a pulse oximeter. Informed him where to get one and why it is important to check his oxygen at home.      Goals/Expected Outcomes  Short: Become more profiecient at using PLB.   Long: Become independent at using PLB.  Short: obtain a pulse oximeter. Long: check oxygen at home independently with pulse oximeter.         Oxygen Discharge (Final Oxygen Re-Evaluation): Oxygen Re-Evaluation - 06/21/17 1038      Program Oxygen Prescription   Program Oxygen Prescription  None      Home Oxygen   Home Oxygen Device  None    Sleep Oxygen Prescription  None  Home Exercise Oxygen Prescription  None    Home at Rest Exercise Oxygen Prescription  None      Goals/Expected Outcomes   Short Term Goals  To learn and demonstrate proper use of respiratory medications;To learn and understand importance of maintaining oxygen saturations>88%;To learn and demonstrate proper pursed lip breathing techniques or other breathing techniques.;To learn and understand importance of monitoring SPO2 with pulse oximeter and demonstrate accurate use of the pulse oximeter.    Long  Term Goals  Verbalizes importance of monitoring SPO2 with pulse oximeter and return demonstration;Maintenance of O2 saturations>88%;Exhibits proper breathing techniques, such as pursed lip breathing or other method taught during program session;Compliance with respiratory medication;Demonstrates proper use of MDI's    Comments  Breaker does not have to use his rescue inhaler when exercising. He does not check his oxygen at home due to not having a pulse oximeter. Informed him where to get one and why it is important to check his oxygen at home.    Goals/Expected Outcomes  Short: obtain a pulse oximeter. Long: check oxygen at home independently  with pulse oximeter.       Initial Exercise Prescription: Initial Exercise Prescription - 04/17/17 1600      Date of Initial Exercise RX and Referring Provider   Date  04/17/17    Referring Provider  Merton Border MD      Treadmill   MPH  1.4    Grade  0.5    Minutes  15    METs  2.17      Recumbant Elliptical   Level  1    RPM  50    Minutes  15    METs  2      T5 Nustep   Level  1    SPM  80    Minutes  15    METs  2      Prescription Details   Frequency (times per week)  3    Duration  Progress to 45 minutes of aerobic exercise without signs/symptoms of physical distress      Intensity   THRR 40-80% of Max Heartrate  98-128    Ratings of Perceived Exertion  11-13    Perceived Dyspnea  0-4      Progression   Progression  Continue to progress workloads to maintain intensity without signs/symptoms of physical distress.      Resistance Training   Training Prescription  Yes    Weight  3 lbs    Reps  10-15       Perform Capillary Blood Glucose checks as needed.  Exercise Prescription Changes: Exercise Prescription Changes    Row Name 04/17/17 1600 04/26/17 1100 04/27/17 1000 05/10/17 1500 05/24/17 1200     Response to Exercise   Blood Pressure (Admit)  122/64  -  116/60  110/62  116/56   Blood Pressure (Exercise)  126/64  -  -  -  -   Blood Pressure (Exit)  122/60  -  106/64  118/70  124/64   Heart Rate (Admit)  68 bpm  -  67 bpm  87 bpm  76 bpm   Heart Rate (Exercise)  78 bpm  -  90 bpm  85 bpm  84 bpm   Heart Rate (Exit)  74 bpm  -  67 bpm  66 bpm  70 bpm   Oxygen Saturation (Admit)  94 %  -  95 %  94 %  93 %   Oxygen Saturation (Exercise)  90 %  -  93 %  94 %  92 %   Oxygen Saturation (Exit)  94 %  -  93 %  96 %  94 %   Rating of Perceived Exertion (Exercise)  9  -  _0 Perceived Dyspnea (Exercise)  1  -  _1 Symptoms  none  -  none  none  none   Comments  walk test results  -  -  -  -   Duration  -  -  Continue with 45 min of  aerobic exercise without signs/symptoms of physical distress.  Continue with 45 min of aerobic exercise without signs/symptoms of physical distress.  Continue with 45 min of aerobic exercise without signs/symptoms of physical distress.   Intensity  -  -  THRR unchanged  THRR unchanged  THRR unchanged     Progression   Progression  -  -  Continue to progress workloads to maintain intensity without signs/symptoms of physical distress.  Continue to progress workloads to maintain intensity without signs/symptoms of physical distress.  Continue to progress workloads to maintain intensity without signs/symptoms of physical distress.   Average METs  -  -  1.95  2.4  2.9     Resistance Training   Training Prescription  -  -  Yes  Yes  Yes   Weight  -  -  3 lb  3 lb  6 lb   Reps  -  -  10-15  10-15  10-15     Interval Training   Interval Training  -  -  -  No  No     Treadmill   MPH  -  -  -  1.4  1.7   Grade  -  -  -  2.5  3   Minutes  -  -  -  15  15   METs  -  -  -  2.5  3     Recumbant Elliptical   Level  -  -  2  3  -   RPM  -  -  48  -  -   Minutes  -  -  15  15  -   METs  -  -  2.1  2.3  -     T5 Nustep   Level  -  -  _2 SPM  -  -  80  88  73   Minutes  -  -  _3 METs  -  -  1.8  2.2  2.8     Home Exercise Plan   Plans to continue exercise at  [facility] (comment) [employer] sneakers at [employer] [place] (comment) [employer] sneakers at [employer] (comment) [employer] sne[employer] at [employer] (comment) [employer] [facility] [place] [employer]   Frequency  -  Add 1 additional day to program exercise sessions.  Add 1 additional day to program exercise sessions.  Add 1 additional day to program exercise sessions.  Add 1 additional day to program exercise sessions.   Initial Home Exercises Provided  -  04/26/17  04/26/17  04/26/17  04/26/17   Row Name 06/07/17 1200 06/21/17 1500           Response to Exercise    Blood Pressure (Admit)  132/64  138/70      Blood Pressure (Exit)  118/64  128/60      Heart Rate (Admit)  80 bpm  72 bpm      Heart Rate (Exercise)  88 bpm  84 bpm      Heart Rate (Exit)  66 bpm  67 bpm      Oxygen Saturation (Admit)  94 %  95 %      Oxygen Saturation (Exercise)  91 %  91 %      Oxygen Saturation (Exit)  95 %  97 %      Rating of Perceived Exertion (Exercise)  13  12      Perceived Dyspnea (Exercise)  2  2      Symptoms  none  none      Duration  Continue with 45 min of aerobic exercise without signs/symptoms of physical distress.  Continue with 45 min of aerobic exercise without signs/symptoms of physical distress.      Intensity  THRR unchanged  Other (comment) progressing workloads - not reaching THR        Progression   Progression  Continue to progress workloads to maintain intensity without signs/symptoms of physical distress.  Continue to progress workloads to maintain intensity without signs/symptoms of physical distress.      Average METs  2.8  2.4        Resistance Training   Training Prescription  Yes  Yes      Weight  6 lb  5 lb      Reps  10-15  10-15        Interval Training   Interval Training  No  No        Treadmill   MPH  1.3  1.5      Grade  7  2      Minutes  15  15      METs  3  2.56        Recumbant Elliptical   Level  4  -      RPM  48  -      Minutes  15  -      METs  2.9  -        T5 Nustep   Level  4  4      SPM  75  77      Minutes  15  15      METs  2.4  2.3        Home Exercise Plan   Plans to continue exercise at  [employer] (comment) Silver sneakers at [employer] [place] (comment) [place] [place] [employer] [place]      Frequency  Add 1 additional day to program exercise sessions.  Add 1 additional day to program exercise sessions.      Initial Home Exercises Provided  04/26/17  04/26/17         Exercise Comments: Exercise Comments    Row Name 04/19/17 1009 04/26/17 1125         Exercise  Comments  First full day of exercise!  Patient was oriented to gym and equipment including functions, settings, policies, and procedures.  Patient's individual exercise prescription and treatment plan were reviewed.  All starting workloads were established based on the results of the 6 minute walk test done at initial orientation visit.  The plan for exercise progression was also introduced and progression will be customized based on patient's performance and goals.  Marland Kitchen  Reviewed home exercise with pt today.  Pt plans to go to [facility] for exercise.  Reviewed THR, pulse, RPE, sign and symptoms, NTG use, and when to call 911 or MD.  Also discussed weather considerations and indoor options.  Pt voiced understanding.         Exercise Goals and Review: Exercise Goals    Row Name 04/17/17 1625             Exercise Goals   Increase Physical Activity  Yes       Intervention  Develop an individualized exercise prescription for aerobic and resistive training based on initial evaluation findings, risk stratification, comorbidities and participant's personal goals.;Provide advice, education, support and counseling about physical activity/exercise needs.       Expected Outcomes  Short Term: Attend rehab on a regular basis to increase amount of physical activity.;Long Term: Add in home exercise to make exercise part of routine and to increase amount of physical activity.;Long Term: Exercising regularly at least 3-5 days a week.       Increase Strength and Stamina  Yes       Intervention  Provide advice, education, support and counseling about physical activity/exercise needs.;Develop an individualized exercise prescription for aerobic and resistive training based on initial evaluation findings, risk stratification, comorbidities and participant's personal goals.       Expected Outcomes  Short Term: Increase workloads from initial exercise prescription for resistance, speed, and METs.;Short Term: Perform  resistance training exercises routinely during rehab and add in resistance training at home;Long Term: Improve cardiorespiratory fitness, muscular endurance and strength as measured by increased METs and functional capacity (6MWT)       Able to understand and use rate of perceived exertion (RPE) scale  Yes       Intervention  Provide education and explanation on how to use RPE scale       Expected Outcomes  Short Term: Able to use RPE daily in rehab to express subjective intensity level;Long Term:  Able to use RPE to guide intensity level when exercising independently       Able to understand and use Dyspnea scale  Yes       Intervention  Provide education and explanation on how to use Dyspnea scale       Expected Outcomes  Short Term: Able to use Dyspnea scale daily in rehab to express subjective sense of shortness of breath during exertion;Long Term: Able to use Dyspnea scale to guide intensity level when exercising independently       Knowledge and understanding of Target Heart Rate Range (THRR)  Yes       Intervention  Provide education and explanation of THRR including how the numbers were predicted and where they are located for reference       Expected Outcomes  Short Term: Able to state/look up THRR;Short Term: Able to use daily as guideline for intensity in rehab;Long Term: Able to use THRR to govern intensity when exercising independently       Able to check pulse independently  Yes       Intervention  Provide education and demonstration on how to check pulse in carotid and radial arteries.;Review the importance of being able to check your own pulse for safety during independent exercise       Expected Outcomes  Long Term: Able to check pulse independently and accurately;Short Term: Able to explain why pulse checking is important during independent exercise       Understanding of  Exercise Prescription  Yes       Intervention  Provide education, explanation, and written materials on patient's  individual exercise prescription       Expected Outcomes  Short Term: Able to explain program exercise prescription;Long Term: Able to explain home exercise prescription to exercise independently          Exercise Goals Re-Evaluation : Exercise Goals Re-Evaluation    Row Name 04/19/17 1009 04/27/17 1027 05/10/17 1539 05/24/17 1218 06/07/17 1233     Exercise Goal Re-Evaluation   Exercise Goals Review  Understanding of Exercise Prescription;Able to understand and use Dyspnea scale;Knowledge and understanding of Target Heart Rate Range (THRR);Able to understand and use rate of perceived exertion (RPE) scale  Increase Physical Activity;Able to understand and use rate of perceived exertion (RPE) scale;Able to understand and use Dyspnea scale;Increase Strength and Stamina  Increase Physical Activity;Able to understand and use rate of perceived exertion (RPE) scale;Knowledge and understanding of Target Heart Rate Range (THRR);Increase Strength and Stamina;Able to understand and use Dyspnea scale  Increase Physical Activity;Able to understand and use rate of perceived exertion (RPE) scale;Increase Strength and Stamina;Able to understand and use Dyspnea scale  Increase Physical Activity;Able to understand and use rate of perceived exertion (RPE) scale;Increase Strength and Stamina;Able to understand and use Dyspnea scale   Comments  Reviewed RPE scale, THR and program prescription with pt today.  Pt voiced understanding and was given a copy of goals to take home.   Pt is tolerating exercise well.  Staff will monitor progress  pt has improved MET level since last update.  Staff will monitor progress  Wesley Preston is making steady progress increasing intensity  levels with exercise.  Staff will monitor progress  Wesley Preston is progressing well with exercise.  He is out of town week of 6/3.  Staff will monitor progress   Expected Outcomes  Short: Use RPE daily to regulate intensity.  Long: Follow program prescription in THR.  Short  - Pt will attend regularly Long - Pt will exercise independently  Short - pt will attend LW regularly Long - Pt will improver overall fitness  Short - Wesley Preston will continue to increase levels Long - Wesley Preston will maintain fitness on his own  Short - Wesley Preston will pick up at level he left last week Long - Wesley Preston will improve MET level   Row Name 06/21/17 1507             Exercise Goal Re-Evaluation   Exercise Goals Review  Increase Physical Activity;Able to understand and use rate of perceived exertion (RPE) scale;Able to understand and use Dyspnea scale;Increase Strength and Stamina       Comments  Pt is tolerating exercise well and doesnt need to use rescue inhaler as much during the day.  Staff will monitor progress.       Expected Outcomes  Short - Wesley Preston will attend regularly Long - Wesley Preston will increase MET level          Discharge Exercise Prescription (Final Exercise Prescription Changes): Exercise Prescription Changes - 06/21/17 1500      Response to Exercise   Blood Pressure (Admit)  138/70    Blood Pressure (Exit)  128/60    Heart Rate (Admit)  72 bpm    Heart Rate (Exercise)  84 bpm    Heart Rate (Exit)  67 bpm    Oxygen Saturation (Admit)  95 %    Oxygen Saturation (Exercise)  91 %    Oxygen Saturation (Exit)  97 %  Rating of Perceived Exertion (Exercise)  12    Perceived Dyspnea (Exercise)  2    Symptoms  none    Duration  Continue with 45 min of aerobic exercise without signs/symptoms of physical distress.    Intensity  Other (comment) progressing workloads - not reaching THR      Progression   Progression  Continue to progress workloads to maintain intensity without signs/symptoms of physical distress.    Average METs  2.4      Resistance Training   Training Prescription  Yes    Weight  5 lb    Reps  10-15      Interval Training   Interval Training  No      Treadmill   MPH  1.5    Grade  2    Minutes  15    METs  2.56      T5 Nustep   Level  4    SPM  77    Minutes  15     METs  2.3      Home Exercise Plan   Plans to continue exercise at  [facility] (comment) [place] [place] [employer] [place]    Frequency  Add 1 additional day to program exercise sessions.    Initial Home Exercises Provided  04/26/17       Nutrition:  Target Goals: Understanding of nutrition guidelines, daily intake of sodium <1575m, cholesterol <2072m calories 30% from fat and 7% or less from saturated fats, daily to have 5 or more servings of fruits and vegetables.  Biometrics: Pre Biometrics - 04/17/17 1626      Pre Biometrics   Height  _0  (1.778 m)    Weight  193 lb 8 oz (87.8 kg)    Waist Circumference  37 inches    Hip Circumference  42 inches    Waist to Hip Ratio  0.88 %    BMI (Calculated)  27.76        Nutrition Therapy Plan and Nutrition Goals: Nutrition Therapy & Goals - 04/26/17 1028      Nutrition Therapy   Diet  TLC    Protein (specify units)  12oz    Fiber  30 grams    Whole Grain Foods  3 servings    Saturated Fats  16 max. grams    Fruits and Vegetables  6 servings/day 8 ideal    Sodium  2000 grams      Personal Nutrition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 breathing and diaphragmatic breathing and practicing self-pacing with activity.    Review  Pt is taking all meds as directed.  Wesley Preston states he is much more active in the summer - he has not had to use rescue inhaler while doing yard work since beginning exercise.    Expected Outcomes  Short - Wesley Preston will take meds as directed and  continue to exercise Long - Wesley Preston will maintain fitness on his own - plans to attend [employer] when he graduates       ITP Comments: ITP Comments    Row Name 04/17/17 1441 05/08/17 0821 06/05/17 0817 07/03/17 0942     ITP Comments  Medical Evaluation completed. Chart sent for review and changes to Dr. Emily Filbert Director of [facility]. Diagnosis can be found in [facility] encounter 11/20/14   30 day review completed. ITP sent to Dr. Emily Filbert Director of [facility]. Continue with ITP unless changes are made by physician   30 day review completed. ITP sent to Dr. Emily Filbert Director of [facility]. Continue with ITP unless changes are made by physician  30 day review completed. ITP  sent to Dr. Emily Filbert Director of [facility]. Continue with ITP unless changes are made by physician       Comments: 30 day review

## 2017-07-10 DIAGNOSIS — J986 Disorders of diaphragm: Secondary | ICD-10-CM | POA: Diagnosis not present

## 2017-07-10 DIAGNOSIS — K219 Gastro-esophageal reflux disease without esophagitis: Secondary | ICD-10-CM | POA: Diagnosis not present

## 2017-07-10 DIAGNOSIS — Z79899 Other long term (current) drug therapy: Secondary | ICD-10-CM | POA: Diagnosis not present

## 2017-07-10 DIAGNOSIS — Z87891 Personal history of nicotine dependence: Secondary | ICD-10-CM | POA: Diagnosis not present

## 2017-07-10 DIAGNOSIS — Z86010 Personal history of colonic polyps: Secondary | ICD-10-CM | POA: Diagnosis not present

## 2017-07-10 DIAGNOSIS — Z8546 Personal history of malignant neoplasm of prostate: Secondary | ICD-10-CM | POA: Diagnosis not present

## 2017-07-10 DIAGNOSIS — J449 Chronic obstructive pulmonary disease, unspecified: Secondary | ICD-10-CM | POA: Diagnosis not present

## 2017-07-10 NOTE — Progress Notes (Signed)
Daily Session Note  Patient Details  Name: Wesley Preston MRN: [id number] Date of Birth: 04/25/1940 Referring Provider:     Pulmonary Rehab from 04/17/2017 in [facility]  Referring Provider  Simonds, David MD      Encounter Date: 07/10/2017  Check In: Session Check In - 07/10/17 1005      Check-In   Location  [facility]    Staff Present  Joseph Hood RCP,RRT,BSRT;Mandi Ballard, BS, PEC;Kelly Hayes, BS, ACSM CEP, Exercise Physiologist    Supervising physician immediately available to respond to emergencies  [facility] immediately available ER MD    Physician(s)  Dr. Kinner and Malinda    Medication changes reported      No    Fall or balance concerns reported     No    Tobacco Cessation  No Change    Warm-up and Cool-down  Performed as group-led instruction    Resistance Training Performed  Yes    VAD Patient?  No      Pain Assessment   Currently in Pain?  No/denies          Social History   Tobacco Use  Smoking Status Former Smoker  . Packs/day: 1.00  . Years: 20.00  . Pack years: 20.00  . Types: Cigarettes  . Last attempt to quit: 01/04/1988  . Years since quitting: 29.5  Smokeless Tobacco Never Used    Goals Met:  Independence with exercise equipment Exercise tolerated well No report of cardiac concerns or symptoms Strength training completed today  Goals Unmet:  Not Applicable  Comments: Pt able to follow exercise prescription today without complaint.  Will continue to monitor for progression.   Dr. Mark Miller is Medical Director for [facility] and [facility]. 

## 2017-07-12 DIAGNOSIS — E538 Deficiency of other specified B group vitamins: Secondary | ICD-10-CM | POA: Diagnosis not present

## 2017-07-17 DIAGNOSIS — J986 Disorders of diaphragm: Secondary | ICD-10-CM | POA: Diagnosis not present

## 2017-07-17 DIAGNOSIS — Z86010 Personal history of colonic polyps: Secondary | ICD-10-CM | POA: Diagnosis not present

## 2017-07-17 DIAGNOSIS — K219 Gastro-esophageal reflux disease without esophagitis: Secondary | ICD-10-CM | POA: Diagnosis not present

## 2017-07-17 DIAGNOSIS — Z79899 Other long term (current) drug therapy: Secondary | ICD-10-CM | POA: Diagnosis not present

## 2017-07-17 DIAGNOSIS — Z8546 Personal history of malignant neoplasm of prostate: Secondary | ICD-10-CM | POA: Diagnosis not present

## 2017-07-17 DIAGNOSIS — Z87891 Personal history of nicotine dependence: Secondary | ICD-10-CM | POA: Diagnosis not present

## 2017-07-17 DIAGNOSIS — J449 Chronic obstructive pulmonary disease, unspecified: Secondary | ICD-10-CM

## 2017-07-17 NOTE — Progress Notes (Signed)
Daily Session Note  Patient Details  Name: [name] MRN: [id number] Date of Birth: Feb 21, 1940 Referring Provider:     Pulmonary Rehab from 04/17/2017 in [facility] Re[facility]  Referring Provider  Merton Border MD      Encounter Date: 07/17/2017  Check In: Session Check In - 07/17/17 1011      Check-In   Location  [facility]    Staff Present  Justin Mend RCP,RRT,BSRT;Amanda Oletta Darter, BA, ACSM CEP, Exercise Physiologist;Kelly Amedeo Plenty, BS, ACSM CEP, Exercise Physiologist    Supervising physician immediately available to respond to emergencies  [facility] immediately available ER MD    Physician(s)  Dr. Jimmye Norman and Corky Downs    Medication changes reported      No    Fall or balance concerns reported     No    Warm-up and Cool-down  Performed as group-led instruction    Resistance Training Performed  Yes    VAD Patient?  No      Pain Assessment   Currently in Pain?  No/denies          Social History   Tobacco Use  Smoking Status Former Smoker  . Packs/day: 1.00  . Years: 20.00  . Pack years: 20.00  . Types: Cigarettes  . Last attempt to quit: 01/04/1988  . Years since quitting: 29.5  Smokeless Tobacco Never Used    Goals Met:  Independence with exercise equipment Exercise tolerated well No report of cardiac concerns or symptoms Strength training completed today  Goals Unmet:  Not Applicable  Comments: Pt able to follow exercise prescription today without complaint.  Will continue to monitor for progression.   Dr. Emily Filbert is Medical Director for [facility] and [facility].

## 2017-07-19 DIAGNOSIS — Z8546 Personal history of malignant neoplasm of prostate: Secondary | ICD-10-CM | POA: Diagnosis not present

## 2017-07-19 DIAGNOSIS — J449 Chronic obstructive pulmonary disease, unspecified: Secondary | ICD-10-CM | POA: Diagnosis not present

## 2017-07-19 DIAGNOSIS — Z87891 Personal history of nicotine dependence: Secondary | ICD-10-CM | POA: Diagnosis not present

## 2017-07-19 DIAGNOSIS — K219 Gastro-esophageal reflux disease without esophagitis: Secondary | ICD-10-CM | POA: Diagnosis not present

## 2017-07-19 DIAGNOSIS — Z79899 Other long term (current) drug therapy: Secondary | ICD-10-CM | POA: Diagnosis not present

## 2017-07-19 DIAGNOSIS — Z86010 Personal history of colonic polyps: Secondary | ICD-10-CM | POA: Diagnosis not present

## 2017-07-19 DIAGNOSIS — J986 Disorders of diaphragm: Secondary | ICD-10-CM | POA: Diagnosis not present

## 2017-07-19 NOTE — Progress Notes (Signed)
Daily Session Note  Patient Details  Name: ZANDON TALTON MRN: [id number] Date of Birth: 19-Jan-1940 Referring Provider:     Pulmonary Rehab from 04/17/2017 in [facility] and Pul[facility] Rehab  Referring Provider  Merton Border MD      Encounter Date: 07/19/2017  Check In: Session Check In - 07/19/17 1053      Check-In   Location  [facility]    Staff Present  Justin Mend Lorre Nick, MA, [place], CCRP, Exercise Physiologist;Naylani Bradner Oletta Darter, [place], ACSM CEP, Exercise Physiologist    Supervising physician immediately available to respond to emergencies  [facility] immediately available ER MD    Physician(s)  Jacqualine Code and Jimmye Norman    Medication changes reported      No    Fall or balance concerns reported     No    Warm-up and Cool-down  Performed on first and last piece of equipment    Resistance Training Performed  Yes    VAD Patient?  No    PAD/SET Patient?  No      Pain Assessment   Currently in Pain?  No/denies    Multiple Pain Sites  No          Social History   Tobacco Use  Smoking Status Former Smoker  . Packs/day: 1.00  . Years: 20.00  . Pack years: 20.00  . Types: Cigarettes  . Last attempt to quit: 01/04/1988  . Years since quitting: 29.5  Smokeless Tobacco Never Used    Goals Met:  Proper associated with RPD/PD & O2 Sat Independence with exercise equipment Exercise tolerated well Strength training completed today  Goals Unmet:  Not Applicable  Comments: Pt able to follow exercise prescription today without complaint.  Will continue to monitor for progression.    Dr. Emily Filbert is Medical Director for [place] and [facility].

## 2017-07-21 DIAGNOSIS — J986 Disorders of diaphragm: Secondary | ICD-10-CM | POA: Diagnosis not present

## 2017-07-21 DIAGNOSIS — J449 Chronic obstructive pulmonary disease, unspecified: Secondary | ICD-10-CM

## 2017-07-21 DIAGNOSIS — Z8546 Personal history of malignant neoplasm of prostate: Secondary | ICD-10-CM | POA: Diagnosis not present

## 2017-07-21 DIAGNOSIS — K219 Gastro-esophageal reflux disease without esophagitis: Secondary | ICD-10-CM | POA: Diagnosis not present

## 2017-07-21 DIAGNOSIS — Z87891 Personal history of nicotine dependence: Secondary | ICD-10-CM | POA: Diagnosis not present

## 2017-07-21 DIAGNOSIS — Z79899 Other long term (current) drug therapy: Secondary | ICD-10-CM | POA: Diagnosis not present

## 2017-07-21 DIAGNOSIS — Z86010 Personal history of colonic polyps: Secondary | ICD-10-CM | POA: Diagnosis not present

## 2017-07-21 NOTE — Progress Notes (Signed)
Daily Session Note  Patient Details  Name: Wesley Preston MRN: [id number] Date of Birth: 11/16/1940 Referring Provider:     Pulmonary Rehab from 04/17/2017 in [facility] Card[facility] and Pulmonary Rehab  Referring Provider  Merton Border MD      Encounter Date: 07/21/2017  Check In: Session Check In - 07/21/17 1013      Check-In   Location  [facility]    Staff Present  Justin Mend Lindell Spar, BA, ACSM CEP, Exercise Physiologist;Meredith Sherryll Burger, RN BSN    Supervising physician immediately available to respond to emergencies  [facility] immediately available ER MD    Physician(s)  Dr. Mariea Clonts and Burlene Arnt    Medication changes reported      No    Fall or balance concerns reported     No    Warm-up and Cool-down  Performed as group-led instruction    Resistance Training Performed  Yes    VAD Patient?  No      Pain Assessment   Currently in Pain?  No/denies          Social History   Tobacco Use  Smoking Status Former Smoker  . Packs/day: 1.00  . Years: 20.00  . Pack years: 20.00  . Types: Cigarettes  . Last attempt to quit: 01/04/1988  . Years since quitting: 29.5  Smokeless Tobacco Never Used    Goals Met:  Independence with exercise equipment Exercise tolerated well No report of cardiac concerns or symptoms Strength training completed today  Goals Unmet:  Not Applicable  Comments: Pt able to follow exercise prescription today without complaint.  Will continue to monitor for progression.   Dr. Emily Filbert is Medical Director for [place] and [facility].

## 2017-07-24 DIAGNOSIS — Z87891 Personal history of nicotine dependence: Secondary | ICD-10-CM | POA: Diagnosis not present

## 2017-07-24 DIAGNOSIS — Z86010 Personal history of colonic polyps: Secondary | ICD-10-CM | POA: Diagnosis not present

## 2017-07-24 DIAGNOSIS — J449 Chronic obstructive pulmonary disease, unspecified: Secondary | ICD-10-CM | POA: Diagnosis not present

## 2017-07-24 DIAGNOSIS — K219 Gastro-esophageal reflux disease without esophagitis: Secondary | ICD-10-CM | POA: Diagnosis not present

## 2017-07-24 DIAGNOSIS — Z8546 Personal history of malignant neoplasm of prostate: Secondary | ICD-10-CM | POA: Diagnosis not present

## 2017-07-24 DIAGNOSIS — Z79899 Other long term (current) drug therapy: Secondary | ICD-10-CM | POA: Diagnosis not present

## 2017-07-24 DIAGNOSIS — J986 Disorders of diaphragm: Secondary | ICD-10-CM | POA: Diagnosis not present

## 2017-07-24 NOTE — Progress Notes (Signed)
Daily Session Note  Patient Details  Name: Wesley Preston MRN: [id number] Date of Birth: 05-26-1940 Referring Provider:     Pulmonary Rehab from 04/17/2017 in [facility] Card[facility] and Pulmonary Rehab  Referring Provider  Merton Border MD      Encounter Date: 07/24/2017  Check In: Session Check In - 07/24/17 1018      Check-In   Location  [facility]    Staff Present  Earlean Shawl, BS, ACSM CEP, Exercise Physiologist;Amanda Oletta Darter, BA, ACSM CEP, Exercise Physiologist;Joseph Flavia Shipper    Supervising physician immediately available to respond to emergencies  [facility] immediately available ER MD    Physician(s)  Dr. Cinda Quest and Dr. Archie Balboa    Medication changes reported      No    Fall or balance concerns reported     No    Tobacco Cessation  No Change    Warm-up and Cool-down  Performed as group-led instruction    Resistance Training Performed  Yes    VAD Patient?  No    PAD/SET Patient?  No      Pain Assessment   Currently in Pain?  No/denies    Multiple Pain Sites  No          Social History   Tobacco Use  Smoking Status Former Smoker  . Packs/day: 1.00  . Years: 20.00  . Pack years: 20.00  . Types: Cigarettes  . Last attempt to quit: 01/04/1988  . Years since quitting: 29.5  Smokeless Tobacco Never Used    Goals Met:  Proper associated with RPD/PD & O2 Sat Independence with exercise equipment Exercise tolerated well No report of cardiac concerns or symptoms Strength training completed today  Goals Unmet:  Not Applicable  Comments: Pt able to follow exercise prescription today without complaint.  Will continue to monitor for progression.    Dr. Emily Filbert is Medical Director for [facility] and [facility].

## 2017-07-28 ENCOUNTER — Encounter: Payer: Medicare HMO | Admitting: *Deleted

## 2017-07-28 DIAGNOSIS — Z87891 Personal history of nicotine dependence: Secondary | ICD-10-CM | POA: Diagnosis not present

## 2017-07-28 DIAGNOSIS — Z8546 Personal history of malignant neoplasm of prostate: Secondary | ICD-10-CM | POA: Diagnosis not present

## 2017-07-28 DIAGNOSIS — Z86010 Personal history of colonic polyps: Secondary | ICD-10-CM | POA: Diagnosis not present

## 2017-07-28 DIAGNOSIS — Z79899 Other long term (current) drug therapy: Secondary | ICD-10-CM | POA: Diagnosis not present

## 2017-07-28 DIAGNOSIS — J449 Chronic obstructive pulmonary disease, unspecified: Secondary | ICD-10-CM

## 2017-07-28 DIAGNOSIS — K219 Gastro-esophageal reflux disease without esophagitis: Secondary | ICD-10-CM | POA: Diagnosis not present

## 2017-07-28 DIAGNOSIS — J986 Disorders of diaphragm: Secondary | ICD-10-CM | POA: Diagnosis not present

## 2017-07-28 NOTE — Progress Notes (Signed)
Daily Session Note  Patient Details  Name: Wesley Preston MRN: [id number] Date of Birth: 1940/06/29 Referring Provider:     Pulmonary Rehab from 04/17/2017 in [facility] Cardiac and Pulmonary Rehab  Referring Provider  Merton Border MD      Encounter Date: 07/28/2017  Check In: Session Check In - 07/28/17 1011      Check-In   Supervising physician immediately available to respond to emergencies  [facility] immediately available ER MD    Physician(s)  Dr. Corky Downs and [facility]    Location  [facility]    Staff Present  Justin Mend RCP,RRT,BSRT;Meredith Sherryll Burger, RN Vickki Hearing, BA, ACSM CEP, Exercise Physiologist    Medication changes reported      No    Fall or balance concerns reported     No    Tobacco Cessation  No Change    Warm-up and Cool-down  Performed as group-led instruction    Resistance Training Performed  Yes    VAD Patient?  No      Pain Assessment   Currently in Pain?  No/denies          Social History   Tobacco Use  Smoking Status Former Smoker  . Packs/day: 1.00  . Years: 20.00  . Pack years: 20.00  . Types: Cigarettes  . Last attempt to quit: 01/04/1988  . Years since quitting: 29.5  Smokeless Tobacco Never Used    Goals Met:  Proper associated with RPD/PD & O2 Sat Independence with exercise equipment Using PLB without cueing & demonstrates good technique Exercise tolerated well No report of cardiac concerns or symptoms Strength training completed today  Goals Unmet:  Not Applicable  Comments: Pt able to follow exercise prescription today without complaint.  Will continue to monitor for progression.    Dr. Emily Filbert is Medical Director for [place] and [facility].

## 2017-07-31 ENCOUNTER — Encounter: Payer: Medicare HMO | Admitting: *Deleted

## 2017-07-31 DIAGNOSIS — Z87891 Personal history of nicotine dependence: Secondary | ICD-10-CM | POA: Diagnosis not present

## 2017-07-31 DIAGNOSIS — K219 Gastro-esophageal reflux disease without esophagitis: Secondary | ICD-10-CM | POA: Diagnosis not present

## 2017-07-31 DIAGNOSIS — Z79899 Other long term (current) drug therapy: Secondary | ICD-10-CM | POA: Diagnosis not present

## 2017-07-31 DIAGNOSIS — J449 Chronic obstructive pulmonary disease, unspecified: Secondary | ICD-10-CM

## 2017-07-31 DIAGNOSIS — Z86010 Personal history of colonic polyps: Secondary | ICD-10-CM | POA: Diagnosis not present

## 2017-07-31 DIAGNOSIS — J986 Disorders of diaphragm: Secondary | ICD-10-CM | POA: Diagnosis not present

## 2017-07-31 DIAGNOSIS — Z8546 Personal history of malignant neoplasm of prostate: Secondary | ICD-10-CM | POA: Diagnosis not present

## 2017-07-31 NOTE — Progress Notes (Signed)
Pulmonary Individual Treatment Plan  Patient Details  Name: Wesley Preston MRN: [id number] Date of Birth: 01-19-1940 Referring Provider:     Pulmonary Rehab from 04/17/2017 in [facility] Cardiac and Pulmonary Rehab  Referring Provider  Merton Border MD      Initial Encounter Date:    Pulmonary Rehab from 04/17/2017 in [facility] Cardiac and Pulmonary Rehab  Date  04/17/17      Visit Diagnosis: COPD, mild ([place])  Patient's Home Medications on Admission:  Current Outpatient Medications:  .  doxazosin (CARDURA) 4 MG tablet, , Disp: , Rfl:  .  gabapentin (NEURONTIN) 100 MG capsule, TAKE 1 CAPSULE (100 MG TOTAL) BY MOUTH 3 (THREE) TIMES DAILY., Disp: , Rfl: 3 .  NASACORT ALLERGY 24HR 55 MCG/ACT AERO nasal inhaler, , Disp: , Rfl:  .  primidone (MYSOLINE) 50 MG tablet, Take 1 tablet by mouth 3 (three) times daily. , Disp: , Rfl:  .  propranolol ER (INDERAL LA) 60 MG 24 hr capsule, Take 60 mg by mouth daily. , Disp: , Rfl:  .  umeclidinium-vilanterol (ANORO ELLIPTA) 62.5-25 MCG/INH AEPB, Inhale 1 puff into the lungs daily., Disp: 180 each, Rfl: 2 .  VENTOLIN HFA 108 (90 Base) MCG/ACT inhaler, INHALE 2 PUFFS INTO THE LUNGS EVERY 6 (SIX) HOURS AS NEEDED FOR WHEEZING OR SHORTNESS OF BREATH., Disp: 1 Inhaler, Rfl: 0  Past Medical History: Past Medical History:  Diagnosis Date  . Anxiety   . Back pain   . BPH (benign prostatic hyperplasia)   . Cancer (HCC)    BASAL CELL  . Chest pain, non-cardiac   . Colon polyps   . COPD (chronic obstructive pulmonary disease) ([place])   . Depression   . Essential tremor   . Essential tremor   . Family history of adverse reaction to anesthesia    DAUGHTERS GET NAUSEATED  . GERD (gastroesophageal reflux disease)    H/O  . Helicobacter pylori (H. pylori) infection   . Hyperlipidemia   . Hypertension   . Neuromuscular disorder ([place])    TREMORS  . Reactive airway disease   . Reactive airway disease   . Restless leg syndrome     Tobacco Use: Social History    Tobacco Use  Smoking Status Former Smoker  . Packs/day: 1.00  . Years: 20.00  . Pack years: 20.00  . Types: Cigarettes  . Last attempt to quit: 01/04/1988  . Years since quitting: 29.5  Smokeless Tobacco Never Used    Labs: Recent Review Flowsheet Data    There is no flowsheet data to display.       Pulmonary Assessment Scores: Pulmonary Assessment Scores    Row Name 04/17/17 1501         ADL UCSD   ADL Phase  Entry     SOB Score total  37     Rest  1     Walk  2     Stairs  3     Bath  1     Dress  1     Shop  2       CAT Score   CAT Score  10       mMRC Score   mMRC Score  1        Pulmonary Function Assessment: Pulmonary Function Assessment - 04/17/17 1504      Pulmonary Function Tests   FVC%  75 % Test performed on 10/30/13    FEV1%  68 %    FEV1/FVC Ratio  71      Breath   Bilateral Breath Sounds  Clear;Decreased    Shortness of Breath  No;Limiting activity       Exercise Target Goals:    Exercise Program Goal: Individual exercise prescription set using results from initial 6 min walk test and THRR while considering  patient's activity barriers and safety.    Exercise Prescription Goal: Initial exercise prescription builds to 30-45 minutes a day of aerobic activity, 2-3 days per week.  Home exercise guidelines will be given to patient during program as part of exercise prescription that the participant will acknowledge.  Activity Barriers & Risk Stratification: Activity Barriers & Cardiac Risk Stratification - 04/17/17 1623      Activity Barriers & Cardiac Risk Stratification   Activity Barriers  Deconditioning;Muscular Weakness;Shortness of Breath       6 Minute Walk: 6 Minute Walk    Row Name 04/17/17 1621         6 Minute Walk   Phase  Initial     Distance  1185 feet     Walk Time  6 minutes     # of Rest Breaks  0     MPH  2.24     METS  2.1     RPE  9     Perceived Dyspnea   1     VO2 Peak  7.36     Symptoms  No      Resting HR  68 bpm     Resting BP  122/64     Resting Oxygen Saturation   94 %     Exercise Oxygen Saturation  during 6 min walk  90 %     Max Ex. HR  78 bpm     Max Ex. BP  126/64     2 Minute Post BP  122/60       Interval HR   6 Minute HR  78     2 Minute Post HR  74     Interval Heart Rate?  Yes       Interval Oxygen   Interval Oxygen?  Yes     Baseline Oxygen Saturation %  94 %     1 Minute Oxygen Saturation %  90 %     1 Minute Liters of Oxygen  0 L Room Air     2 Minute Oxygen Saturation %  90 %     2 Minute Liters of Oxygen  0 L     3 Minute Oxygen Saturation %  90 %     3 Minute Liters of Oxygen  0 L     4 Minute Oxygen Saturation %  90 %     4 Minute Liters of Oxygen  0 L     5 Minute Oxygen Saturation %  90 %     5 Minute Liters of Oxygen  0 L     6 Minute Oxygen Saturation %  91 %     6 Minute Liters of Oxygen  0 L     2 Minute Post Oxygen Saturation %  94 %     2 Minute Post Liters of Oxygen  0 L       Oxygen Initial Assessment: Oxygen Initial Assessment - 04/17/17 1505      Home Oxygen   Home Oxygen Device  None    Sleep Oxygen Prescription  None    Home Exercise Oxygen Prescription  None    Home at  Rest Exercise Oxygen Prescription  None      Initial 6 min Walk   Oxygen Used  None      Program Oxygen Prescription   Program Oxygen Prescription  None      Intervention   Short Term Goals  To learn and demonstrate proper use of respiratory medications;To learn and understand importance of maintaining oxygen saturations>88%;To learn and demonstrate proper pursed lip breathing techniques or other breathing techniques.;To learn and understand importance of monitoring SPO2 with pulse oximeter and demonstrate accurate use of the pulse oximeter.    Long  Term Goals  Verbalizes importance of monitoring SPO2 with pulse oximeter and return demonstration;Maintenance of O2 saturations>88%;Exhibits proper breathing techniques, such as pursed lip breathing or other  method taught during program session;Compliance with respiratory medication;Demonstrates proper use of MDI's       Oxygen Re-Evaluation: Oxygen Re-Evaluation    Row Name 04/19/17 1010 06/21/17 1038 07/17/17 1622         Program Oxygen Prescription   Program Oxygen Prescription  None  None  None       Home Oxygen   Home Oxygen Device  None  None  None     Sleep Oxygen Prescription  None  None  None     Home Exercise Oxygen Prescription  None  None  None     Home at Rest Exercise Oxygen Prescription  None  None  None       Goals/Expected Outcomes   Short Term Goals  To learn and demonstrate proper use of respiratory medications;To learn and understand importance of maintaining oxygen saturations>88%;To learn and demonstrate proper pursed lip breathing techniques or other breathing techniques.;To learn and understand importance of monitoring SPO2 with pulse oximeter and demonstrate accurate use of the pulse oximeter.  To learn and demonstrate proper use of respiratory medications;To learn and understand importance of maintaining oxygen saturations>88%;To learn and demonstrate proper pursed lip breathing techniques or other breathing techniques.;To learn and understand importance of monitoring SPO2 with pulse oximeter and demonstrate accurate use of the pulse oximeter.  To learn and demonstrate proper use of respiratory medications;To learn and understand importance of maintaining oxygen saturations>88%;To learn and demonstrate proper pursed lip breathing techniques or other breathing techniques.;To learn and understand importance of monitoring SPO2 with pulse oximeter and demonstrate accurate use of the pulse oximeter.     Long  Term Goals  Verbalizes importance of monitoring SPO2 with pulse oximeter and return demonstration;Maintenance of O2 saturations>88%;Exhibits proper breathing techniques, such as pursed lip breathing or other method taught during program session;Compliance with  respiratory medication;Demonstrates proper use of MDI's  Verbalizes importance of monitoring SPO2 with pulse oximeter and return demonstration;Maintenance of O2 saturations>88%;Exhibits proper breathing techniques, such as pursed lip breathing or other method taught during program session;Compliance with respiratory medication;Demonstrates proper use of MDI's  Verbalizes importance of monitoring SPO2 with pulse oximeter and return demonstration;Maintenance of O2 saturations>88%;Exhibits proper breathing techniques, such as pursed lip breathing or other method taught during program session;Compliance with respiratory medication;Demonstrates proper use of MDI's     Comments  Reviewed PLB technique with pt.  Talked about how it work and it's important to maintaining his exercise saturations.    Wesley Preston does not have to use his rescue inhaler when exercising. He does not check his oxygen at home due to not having a pulse oximeter. Informed him where to get one and why it is important to check his oxygen at home.  Wesley Preston's oxygen has been doing well  in the program. He has not fallen below 90 percent when exercisisng. Informed him to check his oxygen at home or when he is working.      Goals/Expected Outcomes  Short: Become more profiecient at using PLB.   Long: Become independent at using PLB.  Short: obtain a pulse oximeter. Long: check oxygen at home independently with pulse oximeter.  Short: monitor oxygen at home with exertion. Long: maintain oxygen saturations above 88 percent independently.        Oxygen Discharge (Final Oxygen Re-Evaluation): Oxygen Re-Evaluation - 07/17/17 1622      Program Oxygen Prescription   Program Oxygen Prescription  None      Home Oxygen   Home Oxygen Device  None    Sleep Oxygen Prescription  None    Home Exercise Oxygen Prescription  None    Home at Rest Exercise Oxygen Prescription  None      Goals/Expected Outcomes   Short Term Goals  To learn and demonstrate proper use  of respiratory medications;To learn and understand importance of maintaining oxygen saturations>88%;To learn and demonstrate proper pursed lip breathing techniques or other breathing techniques.;To learn and understand importance of monitoring SPO2 with pulse oximeter and demonstrate accurate use of the pulse oximeter.    Long  Term Goals  Verbalizes importance of monitoring SPO2 with pulse oximeter and return demonstration;Maintenance of O2 saturations>88%;Exhibits proper breathing techniques, such as pursed lip breathing or other method taught during program session;Compliance with respiratory medication;Demonstrates proper use of MDI's    Comments  Wesley Preston's oxygen has been doing well in the program. He has not fallen below 90 percent when exercisisng. Informed him to check his oxygen at home or when he is working.     Goals/Expected Outcomes  Short: monitor oxygen at home with exertion. Long: maintain oxygen saturations above 88 percent independently.       Initial Exercise Prescription: Initial Exercise Prescription - 04/17/17 1600      Date of Initial Exercise RX and Referring Provider   Date  04/17/17    Referring Provider  Merton Border MD      Treadmill   MPH  1.4    Grade  0.5    Minutes  15    METs  2.17      Recumbant Elliptical   Level  1    RPM  50    Minutes  15    METs  2      T5 Nustep   Level  1    SPM  80    Minutes  15    METs  2      Prescription Details   Frequency (times per week)  3    Duration  Progress to 45 minutes of aerobic exercise without signs/symptoms of physical distress      Intensity   THRR 40-80% of Max Heartrate  98-128    Ratings of Perceived Exertion  11-13    Perceived Dyspnea  0-4      Progression   Progression  Continue to progress workloads to maintain intensity without signs/symptoms of physical distress.      Resistance Training   Training Prescription  Yes    Weight  3 lbs    Reps  10-15       Perform Capillary Blood  Glucose checks as needed.  Exercise Prescription Changes: Exercise Prescription Changes    Row Name 04/17/17 1600 04/26/17 1100 04/27/17 1000 05/10/17 1500 05/24/17 1200     Response to  Exercise   Blood Pressure (Admit)  122/64  -  116/60  110/62  116/56   Blood Pressure (Exercise)  126/64  -  -  -  -   Blood Pressure (Exit)  122/60  -  106/64  118/70  124/64   Heart Rate (Admit)  68 bpm  -  67 bpm  87 bpm  76 bpm   Heart Rate (Exercise)  78 bpm  -  90 bpm  85 bpm  84 bpm   Heart Rate (Exit)  74 bpm  -  67 bpm  66 bpm  70 bpm   Oxygen Saturation (Admit)  94 %  -  95 %  94 %  93 %   Oxygen Saturation (Exercise)  90 %  -  93 %  94 %  92 %   Oxygen Saturation (Exit)  94 %  -  93 %  96 %  94 %   Rating of Perceived Exertion (Exercise)  9  -  '11  12  11   '$ Perceived Dyspnea (Exercise)  1  -  '1  1  3   '$ Symptoms  none  -  none  none  none   Comments  walk test results  -  -  -  -   Duration  -  -  Continue with 45 min of aerobic exercise without signs/symptoms of physical distress.  Continue with 45 min of aerobic exercise without signs/symptoms of physical distress.  Continue with 45 min of aerobic exercise without signs/symptoms of physical distress.   Intensity  -  -  THRR unchanged  THRR unchanged  THRR unchanged     Progression   Progression  -  -  Continue to progress workloads to maintain intensity without signs/symptoms of physical distress.  Continue to progress workloads to maintain intensity without signs/symptoms of physical distress.  Continue to progress workloads to maintain intensity without signs/symptoms of physical distress.   Average METs  -  -  1.95  2.4  2.9     Resistance Training   Training Prescription  -  -  Yes  Yes  Yes   Weight  -  -  3 lb  3 lb  6 lb   Reps  -  -  10-15  10-15  10-15     Interval Training   Interval Training  -  -  -  No  No     Treadmill   MPH  -  -  -  1.4  1.7   Grade  -  -  -  2.5  3   Minutes  -  -  -  15  15   METs  -  -  -  2.5  3      Recumbant Elliptical   Level  -  -  2  3  -   RPM  -  -  48  -  -   Minutes  -  -  15  15  -   METs  -  -  2.1  2.3  -     T5 Nustep   Level  -  -  '2  3  5   '$ SPM  -  -  80  88  73   Minutes  -  -  '15  15  15   '$ METs  -  -  1.8  2.2  2.8     Home Exercise Plan   Plans  to continue exercise at  [facility] (comment) Silver sneakers at [place] (comment) [employer] sneakers at [employer], [employer] (comment) [employer] sne[employer] at [employer] (comment) [place]   Frequency  -  Add 1 additional day to program exercise sessions.  Add 1 additional day to program exercise sessions.  Add 1 additional day to program exercise sessions.  Add 1 additional day to program exercise sessions.   Initial Home Exercises Provided  -  04/26/17  04/26/17  04/26/17  04/26/17   Row Name 06/07/17 1200 06/21/17 1500 07/05/17 1000 07/19/17 1100 07/19/17 1200     Response to Exercise   Blood Pressure (Admit)  132/64  138/70  123/64  124/62  -   Blood Pressure (Exit)  118/64  128/60  118/60  106/66  -   Heart Rate (Admit)  80 bpm  72 bpm  69 bpm  73 bpm  -   Heart Rate (Exercise)  88 bpm  84 bpm  90 bpm  85 bpm  -   Heart Rate (Exit)  66 bpm  67 bpm  68 bpm  73 bpm  -   Oxygen Saturation (Admit)  94 %  95 %  93 %  94 %  -   Oxygen Saturation (Exercise)  91 %  91 %  92 %  93 %  -   Oxygen Saturation (Exit)  95 %  97 %  94 %  95 %  -   Rating of Perceived Exertion (Exercise)  '13  12  12  12  '$ -   Perceived Dyspnea (Exercise)  '2  2  2  2  '$ -   Symptoms  none  none  none  none  -   Duration  Continue with 45 min of aerobic exercise without signs/symptoms of physical distress.  Continue with 45 min of aerobic exercise without signs/symptoms of physical distress.  Continue with 45 min of aerobic exercise without signs/symptoms of physical distress.  Continue with 45 min of aerobic exercise without signs/symptoms of physical distress.   -   Intensity  THRR unchanged  Other (comment) progressing workloads - not reaching THR  THRR unchanged  THRR unchanged  -     Progression   Progression  Continue to progress workloads to maintain intensity without signs/symptoms of physical distress.  Continue to progress workloads to maintain intensity without signs/symptoms of physical distress.  Continue to progress workloads to maintain intensity without signs/symptoms of physical distress.  Continue to progress workloads to maintain intensity without signs/symptoms of physical distress.  -   Average METs  2.8  2.4  2.61  2.63  -     Resistance Training   Training Prescription  Yes  Yes  Yes  Yes  -   Weight  6 lb  5 lb  5 lb  4 lb  -   Reps  10-15  10-15  10-15  10-15  -     Interval Training   Interval Training  No  No  No  No  -     Treadmill   MPH  1.3  1.'5  2  2  '$ -   Grade  7  2  2.5  2.5  -   Minutes  '15  15  15  15  '$ -   METs  3  2.56  3.22  3.22  -     Recumbant Elliptical  Level  4  -  3  4  -   RPM  48  -  51  50  -   Minutes  15  -  15  15  -   METs  2.9  -  2.4  1.8  -     T5 Nustep   Level  '4  4  3  '$ -  -   SPM  75  77  99  -  -   Minutes  '15  15  15  '$ -  -   METs  2.4  2.3  2.2  -  -     Home Exercise Plan   Plans to continue exercise at  [employer] (comment) [place] sneakers at [place] (comment) [employer] sneakers at [employer] (comment) [place] [place] [employer] [place]  -  [employer] (comment) [place] [place]   Frequency  Add 1 additional day to program exercise sessions.  Add 1 additional day to program exercise sessions.  Add 2 additional days to program exercise sessions.  -  Add 2 additional days to program exercise sessions.   Initial Home Exercises Provided  04/26/17  04/26/17  04/26/17  -  04/26/17      Exercise Comments: Exercise Comments    Row Name 04/19/17 1009 04/26/17 1125         Exercise Comments  First full  day of exercise!  Patient was oriented to gym and equipment including functions, settings, policies, and procedures.  Patient's individual exercise prescription and treatment plan were reviewed.  All starting workloads were established based on the results of the 6 minute walk test done at initial orientation visit.  The plan for exercise progression was also introduced and progression will be customized based on patient's performance and goals.  .Reviewed home exercise with pt today.  Pt plans to go to [facility] for exercise.  Reviewed THR, pulse, RPE, sign and symptoms, NTG use, and when to call 911 or MD.  Also discussed weather considerations and indoor options.  Pt voiced understanding.         Exercise Goals and Review: Exercise Goals    Row Name 04/17/17 1625             Exercise Goals   Increase Physical Activity  Yes       Intervention  Develop an individualized exercise prescription for aerobic and resistive training based on initial evaluation findings, risk stratification, comorbidities and participant's personal goals.;Provide advice, education, support and counseling about physical activity/exercise needs.       Expected Outcomes  Short Term: Attend rehab on a regular basis to increase amount of physical activity.;Long Term: Add in home exercise to make exercise part of routine and to increase amount of physical activity.;Long Term: Exercising regularly at least 3-5 days a week.       Increase Strength and Stamina  Yes       Intervention  Provide advice, education, support and counseling about physical activity/exercise needs.;Develop an individualized exercise prescription for aerobic and resistive training based on initial evaluation findings, risk stratification, comorbidities and participant's personal goals.       Expected Outcomes  Short Term: Increase workloads from initial exercise prescription for resistance, speed, and METs.;Short Term: Perform resistance training  exercises routinely during rehab and add in resistance training at home;Long Term: Improve cardiorespiratory fitness, muscular endurance and strength as measured by increased METs and functional capacity (6MWT)  Able to understand and use rate of perceived exertion (RPE) scale  Yes       Intervention  Provide education and explanation on how to use RPE scale       Expected Outcomes  Short Term: Able to use RPE daily in rehab to express subjective intensity level;Long Term:  Able to use RPE to guide intensity level when exercising independently       Able to understand and use Dyspnea scale  Yes       Intervention  Provide education and explanation on how to use Dyspnea scale       Expected Outcomes  Short Term: Able to use Dyspnea scale daily in rehab to express subjective sense of shortness of breath during exertion;Long Term: Able to use Dyspnea scale to guide intensity level when exercising independently       Knowledge and understanding of Target Heart Rate Range (THRR)  Yes       Intervention  Provide education and explanation of THRR including how the numbers were predicted and where they are located for reference       Expected Outcomes  Short Term: Able to state/look up THRR;Short Term: Able to use daily as guideline for intensity in rehab;Long Term: Able to use THRR to govern intensity when exercising independently       Able to check pulse independently  Yes       Intervention  Provide education and demonstration on how to check pulse in carotid and radial arteries.;Review the importance of being able to check your own pulse for safety during independent exercise       Expected Outcomes  Long Term: Able to check pulse independently and accurately;Short Term: Able to explain why pulse checking is important during independent exercise       Understanding of Exercise Prescription  Yes       Intervention  Provide education, explanation, and written materials on patient's individual exercise  prescription       Expected Outcomes  Short Term: Able to explain program exercise prescription;Long Term: Able to explain home exercise prescription to exercise independently          Exercise Goals Re-Evaluation : Exercise Goals Re-Evaluation    Row Name 04/19/17 1009 04/27/17 1027 05/10/17 1539 05/24/17 1218 06/07/17 1233     Exercise Goal Re-Evaluation   Exercise Goals Review  Understanding of Exercise Prescription;Able to understand and use Dyspnea scale;Knowledge and understanding of Target Heart Rate Range (THRR);Able to understand and use rate of perceived exertion (RPE) scale  Increase Physical Activity;Able to understand and use rate of perceived exertion (RPE) scale;Able to understand and use Dyspnea scale;Increase Strength and Stamina  Increase Physical Activity;Able to understand and use rate of perceived exertion (RPE) scale;Knowledge and understanding of Target Heart Rate Range (THRR);Increase Strength and Stamina;Able to understand and use Dyspnea scale  Increase Physical Activity;Able to understand and use rate of perceived exertion (RPE) scale;Increase Strength and Stamina;Able to understand and use Dyspnea scale  Increase Physical Activity;Able to understand and use rate of perceived exertion (RPE) scale;Increase Strength and Stamina;Able to understand and use Dyspnea scale   Comments  Reviewed RPE scale, THR and program prescription with pt today.  Pt voiced understanding and was given a copy of goals to take home.   Pt is tolerating exercise well.  Staff will monitor progress  pt has improved MET level since last update.  Staff will monitor progress  Wesley Preston is making steady progress increasing intensity  levels with  exercise.  Staff will monitor progress  Wesley Preston is progressing well with exercise.  He is out of town week of 6/3.  Staff will monitor progress   Expected Outcomes  Short: Use RPE daily to regulate intensity.  Long: Follow program prescription in THR.  Short - Pt will attend  regularly Long - Pt will exercise independently  Short - pt will attend LW regularly Long - Pt will improver overall fitness  Short - Wesley Preston will continue to increase levels Long - Wesley Preston will maintain fitness on his own  Short - Wesley Preston will pick up at level he left last week Long Wesley Preston will improve MET level   Row Name 06/21/17 1507 07/05/17 1046 07/19/17 1205         Exercise Goal Re-Evaluation   Exercise Goals Review  Increase Physical Activity;Able to understand and use rate of perceived exertion (RPE) scale;Able to understand and use Dyspnea scale;Increase Strength and Stamina  Increase Physical Activity;Understanding of Exercise Prescription;Increase Strength and Stamina  Increase Physical Activity;Increase Strength and Stamina;Able to understand and use Dyspnea scale;Able to understand and use rate of perceived exertion (RPE) scale     Comments  Pt is tolerating exercise well and doesnt need to use rescue inhaler as much during the day.  Staff will monitor progress.  Wesley Preston continues to do well in rehab.  He is up to 2.0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 knowledge of respiratory medications and ability to use respiratory devices properly.  Weight Management/Obesity;Improve shortness of breath with ADL's;Other;Hypertension;Develop more efficient breathing techniques such as purse lipped breathing and diaphragmatic breathing and practicing self-pacing with activity.  Weight Management/Obesity;Improve shortness of breath with  ADL's;Hypertension     Review  Pt reports less SOB since starting exercise.  He practices PLB.  His only stress is worry about daughters health - who is having a baby in July.  He saw PCP this morning and he added metamucil .  Pt is taking all meds as directed.  Wesley Preston states he is much more active in the summer - he has not had to use rescue inhaler while doing yard work since beginning exercise.  Wesley Preston feels that his shortness of breath has improved since he started the program. He wants to finish the program and feels that he is not sick as some of the other patients and questions himself being there. Informed him that it is still good to exercise and work on his breathing techniques.     Expected Outcomes  Short - Pt will continue to exercise and take meds as directed Long - Pt will continue healthy habits long term   Short - Wesley Preston will take meds as directed and continue to exercise Long - Wesley Preston will maintain fitness on his own - plans to attend [employer] when he graduates  Short: Attend [facility] regularly to improve shortness of breath with ADL's. Long: maintain independence with ADL's         Core Components/Risk Factors/Patient Goals at Discharge (Final Review):  Goals and Risk Factor Review - 07/17/17 1620      Core Components/Risk Factors/Patient Goals Review   Personal Goals Review  Weight Management/Obesity;Improve shortness of breath with ADL's;Hypertension    Review  Wesley Preston feels that his shortness of breath has improved since he started the program. He wants to finish the program and feels that he  is not sick as some of the other patients and questions himself being there. Informed him that it is still good to exercise and work on his breathing techniques.    Expected Outcomes  Short: Attend [facility] regularly to improve shortness of breath with ADL's. Long: maintain independence with ADL's        ITP Comments: ITP Comments    Row Name 04/17/17 1441 05/08/17 0821 06/05/17  0817 07/03/17 0942 07/31/17 0839   ITP Comments  Medical Evaluation completed. Chart sent for review and changes to Dr. Emily Filbert Director of [place]. Diagnosis can be found in [facility] encounter 11/20/14   30 day review completed. ITP sent to Dr. Emily Filbert Director of [place]. Continue with ITP unless changes are made by physician   30 day review completed. ITP sent to Dr. Emily Filbert Director of [place]. Continue with ITP unless changes are made by physician  30 day review completed. ITP sent to Dr. Emily Filbert Director of [place]. Continue with ITP unless changes are made by physician  30 day review completed. ITP sent to Dr. Emily Filbert Director of [place]. Continue with ITP unless changes are made by physician      Comments: 30 day review

## 2017-07-31 NOTE — Progress Notes (Signed)
Daily Session Note  Patient Details  Name: VERLIN UHER MRN: [id number] Date of Birth: Jan 27, 1940 Referring Provider:     Pulmonary Rehab from 04/17/2017 in [facility]  Referring Provider  Merton Border MD      Encounter Date: 07/31/2017  Check In: Session Check In - 07/31/17 1015      Check-In   Supervising physician immediately available to respond to emergencies  [facility] immediately available ER MD    Physician(s)  Dr. Cinda Quest and Dr. Archie Balboa    Location  [facility]-Cardiac & Pulmonary Rehab    Staff Present  Justin Mend Jaci Carrel, BS, ACSM CEP, Exercise Physiologist;Amanda Oletta Darter, [place], ACSM CEP, Exercise Physiologist    Medication changes reported      No    Fall or balance concerns reported     No    Tobacco Cessation  No Change    Warm-up and Cool-down  Performed as group-led instruction    Resistance Training Performed  Yes    VAD Patient?  No    PAD/SET Patient?  No      Pain Assessment   Currently in Pain?  No/denies    Multiple Pain Sites  No          Social History   Tobacco Use  Smoking Status Former Smoker  . Packs/day: 1.00  . Years: 20.00  . Pack years: 20.00  . Types: Cigarettes  . Last attempt to quit: 01/04/1988  . Years since quitting: 29.5  Smokeless Tobacco Never Used    Goals Met:  Proper associated with RPD/PD & O2 Sat Independence with exercise equipment Exercise tolerated well No report of cardiac concerns or symptoms Strength training completed today  Goals Unmet:  Not Applicable  Comments: Pt able to follow exercise prescription today without complaint.  Will continue to monitor for progression.    Dr. Emily Filbert is Medical Director for [place] and [facility].

## 2017-08-04 ENCOUNTER — Encounter: Payer: Medicare HMO | Attending: Pulmonary Disease

## 2017-08-04 DIAGNOSIS — J986 Disorders of diaphragm: Secondary | ICD-10-CM | POA: Insufficient documentation

## 2017-08-04 DIAGNOSIS — Z79899 Other long term (current) drug therapy: Secondary | ICD-10-CM | POA: Insufficient documentation

## 2017-08-04 DIAGNOSIS — K219 Gastro-esophageal reflux disease without esophagitis: Secondary | ICD-10-CM | POA: Insufficient documentation

## 2017-08-04 DIAGNOSIS — Z87891 Personal history of nicotine dependence: Secondary | ICD-10-CM | POA: Insufficient documentation

## 2017-08-04 DIAGNOSIS — Z8546 Personal history of malignant neoplasm of prostate: Secondary | ICD-10-CM | POA: Insufficient documentation

## 2017-08-04 DIAGNOSIS — J449 Chronic obstructive pulmonary disease, unspecified: Secondary | ICD-10-CM | POA: Insufficient documentation

## 2017-08-04 DIAGNOSIS — Z86010 Personal history of colonic polyps: Secondary | ICD-10-CM | POA: Insufficient documentation

## 2017-08-07 ENCOUNTER — Encounter: Payer: Medicare HMO | Admitting: *Deleted

## 2017-08-07 DIAGNOSIS — J986 Disorders of diaphragm: Secondary | ICD-10-CM | POA: Diagnosis not present

## 2017-08-07 DIAGNOSIS — J449 Chronic obstructive pulmonary disease, unspecified: Secondary | ICD-10-CM | POA: Diagnosis not present

## 2017-08-07 DIAGNOSIS — K219 Gastro-esophageal reflux disease without esophagitis: Secondary | ICD-10-CM | POA: Diagnosis not present

## 2017-08-07 DIAGNOSIS — Z8546 Personal history of malignant neoplasm of prostate: Secondary | ICD-10-CM | POA: Diagnosis not present

## 2017-08-07 DIAGNOSIS — Z87891 Personal history of nicotine dependence: Secondary | ICD-10-CM | POA: Diagnosis not present

## 2017-08-07 DIAGNOSIS — Z79899 Other long term (current) drug therapy: Secondary | ICD-10-CM | POA: Diagnosis not present

## 2017-08-07 DIAGNOSIS — Z86010 Personal history of colonic polyps: Secondary | ICD-10-CM | POA: Diagnosis not present

## 2017-08-07 NOTE — Progress Notes (Signed)
Daily Session Note  Patient Details  Name: Wesley Preston MRN: [id number] Date of Birth: 1940-04-26 Referring Provider:     Pulmonary Rehab from 04/17/2017 in [facility] Re[facility]  Referring Provider  Merton Border MD      Encounter Date: 08/07/2017  Check In: Session Check In - 08/07/17 1025      Check-In   Supervising physician immediately available to respond to emergencies  [facility] immediately available ER MD    Physician(s)  Dr. Joni Fears and Dr. Corky Downs    Location  [facility] Pul[facility]    Staff Present  Justin Mend RCP,RRT,BSRT;Amanda Oletta Darter, [place], ACSM CEP, Exercise Physiologist;Maeleigh Buschman Amedeo Plenty, BS, ACSM CEP, Exercise Physiologist    Medication changes reported      No    Fall or balance concerns reported     No    Tobacco Cessation  No Change    Warm-up and Cool-down  Performed as group-led instruction    Resistance Training Performed  Yes    VAD Patient?  No    PAD/SET Patient?  No      Pain Assessment   Currently in Pain?  No/denies    Multiple Pain Sites  No          Social History   Tobacco Use  Smoking Status Former Smoker  . Packs/day: 1.00  . Years: 20.00  . Pack years: 20.00  . Types: Cigarettes  . Last attempt to quit: 01/04/1988  . Years since quitting: 29.6  Smokeless Tobacco Never Used    Goals Met:  Proper associated with RPD/PD & O2 Sat Independence with exercise equipment Exercise tolerated well No report of cardiac concerns or symptoms Strength training completed today  Goals Unmet:  Not Applicable  Comments: Pt able to follow exercise prescription today without complaint.  Will continue to monitor for progression.    Dr. Emily Filbert is Medical Director for [facility] and [facility].

## 2017-08-17 DIAGNOSIS — E538 Deficiency of other specified B group vitamins: Secondary | ICD-10-CM | POA: Diagnosis not present

## 2017-08-25 ENCOUNTER — Telehealth: Payer: Self-pay

## 2017-08-25 DIAGNOSIS — J449 Chronic obstructive pulmonary disease, unspecified: Secondary | ICD-10-CM

## 2017-08-25 NOTE — Telephone Encounter (Signed)
Patient states he has been missing [facility] due to a new grandchild. He has been out of town and plans to return Friday.

## 2017-08-28 ENCOUNTER — Telehealth: Payer: Self-pay

## 2017-08-28 DIAGNOSIS — J449 Chronic obstructive pulmonary disease, unspecified: Secondary | ICD-10-CM

## 2017-08-28 NOTE — Progress Notes (Signed)
Discharge Progress Report  Patient Details  Name: Wesley Preston MRN: [id number] Date of Birth: 26-Apr-1940 Referring Provider:     Pulmonary Rehab from 04/17/2017 in [facility] and [facility] Re[facility]  Referring Provider  Merton Border MD       Number of Visits: 27/36  Reason for Discharge:  Early Exit:  Personal  Smoking History:  Social History   Tobacco Use  Smoking Status Former Smoker  . Packs/day: 1.00  . Years: 20.00  . Pack years: 20.00  . Types: Cigarettes  . Last attempt to quit: 01/04/1988  . Years since quitting: 29.6  Smokeless Tobacco Never Used    Diagnosis:  COPD, mild ([place])  ADL UCSD: Pulmonary Assessment Scores    Row Name 04/17/17 1501         ADL UCSD   ADL Phase  Entry     SOB Score total  37     Rest  1     Walk  2     Stairs  3     Bath  1     Dress  1     Shop  2       CAT Score   CAT Score  10       mMRC Score   mMRC Score  1        Initial Exercise Prescription: Initial Exercise Prescription - 04/17/17 1600      Date of Initial Exercise RX and Referring Provider   Date  04/17/17    Referring Provider  Merton Border MD      Treadmill   MPH  1.4    Grade  0.5    Minutes  15    METs  2.17      Recumbant Elliptical   Level  1    RPM  50    Minutes  15    METs  2      T5 Nustep   Level  1    SPM  80    Minutes  15    METs  2      Prescription Details   Frequency (times per week)  3    Duration  Progress to 45 minutes of aerobic exercise without signs/symptoms of physical distress      Intensity   THRR 40-80% of Max Heartrate  98-128    Ratings of Perceived Exertion  11-13    Perceived Dyspnea  0-4      Progression   Progression  Continue to progress workloads to maintain intensity without signs/symptoms of physical distress.      Resistance Training   Training Prescription  Yes    Weight  3 lbs    Reps  10-15       Discharge Exercise Prescription (Final Exercise Prescription Changes): Exercise  Prescription Changes - 08/18/17 0800      Response to Exercise   Blood Pressure (Admit)  130/70    Blood Pressure (Exit)  126/68    Heart Rate (Admit)  72 bpm    Heart Rate (Exercise)  77 bpm    Heart Rate (Exit)  62 bpm    Oxygen Saturation (Admit)  92 %    Oxygen Saturation (Exercise)  90 %    Oxygen Saturation (Exit)  94 %    Rating of Perceived Exertion (Exercise)  11    Perceived Dyspnea (Exercise)  2    Symptoms  none    Duration  Continue with 45 min of aerobic  exercise without signs/symptoms of physical distress.    Intensity  THRR unchanged      Progression   Progression  Continue to progress workloads to maintain intensity without signs/symptoms of physical distress.    Average METs  2.7      Resistance Training   Training Prescription  Yes    Weight  4 lb    Reps  10-15      Interval Training   Interval Training  No      Treadmill   MPH  2    Grade  2.5    Minutes  15    METs  3.22      Recumbant Elliptical   Level  4    RPM  50    Minutes  15    METs  2.2      T5 Nustep   Level  4    Minutes  15    METs  2.2      Home Exercise Plan   Plans to continue exercise at  [facility] (comment)   [employer] sneakers at [employer]   Frequency  Add 2 additional days to program exercise sessions.    Initial Home Exercises Provided  04/26/17       Functional Capacity: 6 Minute Walk    Row Name 04/17/17 1621         6 Minute Walk   Phase  Initial     Distance  1185 feet     Walk Time  6 minutes     # of Rest Breaks  0     MPH  2.24     METS  2.1     RPE  9     Perceived Dyspnea   1     VO2 Peak  7.36     Symptoms  No     Resting HR  68 bpm     Resting BP  122/64     Resting Oxygen Saturation   94 %     Exercise Oxygen Saturation  during 6 min walk  90 %     Max Ex. HR  78 bpm     Max Ex. BP  126/64     2 Minute Post BP  122/60       Interval HR   6 Minute HR  78     2 Minute Post HR  74     Interval Heart Rate?  Yes        Interval Oxygen   Interval Oxygen?  Yes     Baseline Oxygen Saturation %  94 %     1 Minute Oxygen Saturation %  90 %     1 Minute Liters of Oxygen  0 L Room Air     2 Minute Oxygen Saturation %  90 %     2 Minute Liters of Oxygen  0 L     3 Minute Oxygen Saturation %  90 %     3 Minute Liters of Oxygen  0 L     4 Minute Oxygen Saturation %  90 %     4 Minute Liters of Oxygen  0 L     5 Minute Oxygen Saturation %  90 %     5 Minute Liters of Oxygen  0 L     6 Minute Oxygen Saturation %  91 %     6 Minute Liters of Oxygen  0 L     2  Minute Post Oxygen Saturation %  94 %     2 Minute Post Liters of Oxygen  0 L        Psychological, QOL, Others - Outcomes: PHQ 2/9: Depression screen PHQ 2/9 04/17/2017  Decreased Interest 1  Down, Depressed, Hopeless 1  PHQ - 2 Score 2  Altered sleeping 2  Tired, decreased energy 2  Change in appetite 1  Feeling bad or failure about yourself  0  Trouble concentrating 2  Moving slowly or fidgety/restless 2  Suicidal thoughts 0  PHQ-9 Score 11  Difficult doing work/chores Somewhat difficult    Quality of Life:   Personal Goals: Goals established at orientation with interventions provided to work toward goal. Personal Goals and Risk Factors at Admission - 04/17/17 1506      Core Components/Risk Factors/Patient Goals on Admission    Weight Management  Yes;Weight Maintenance;Weight Loss    Intervention  Weight Management: Develop a combined nutrition and exercise program designed to reach desired caloric intake, while maintaining appropriate intake of nutrient and fiber, sodium and fats, and appropriate energy expenditure required for the weight goal.;Weight Management: Provide education and appropriate resources to help participant work on and attain dietary goals.;Weight Management/Obesity: Establish reasonable short term and long term weight goals.    Admit Weight  193 lb 8 oz (87.8 kg)    Goal Weight: Short Term  188 lb (85.3 kg)    Goal  Weight: Long Term  180 lb (81.6 kg)    Expected Outcomes  Short Term: Continue to assess and modify interventions until short term weight is achieved;Long Term: Adherence to nutrition and physical activity/exercise program aimed toward attainment of established weight goal;Weight Maintenance: Understanding of the daily nutrition guidelines, which includes 25-35% calories from fat, 7% or less cal from saturated fats, less than 200mg  cholesterol, less than 1.5gm of sodium, & 5 or more servings of fruits and vegetables daily;Understanding recommendations for meals to include 15-35% energy as protein, 25-35% energy from fat, 35-60% energy from carbohydrates, less than 200mg  of dietary cholesterol, 20-35 gm of total fiber daily;Weight Loss: Understanding of general recommendations for a balanced deficit meal plan, which promotes 1-2 lb weight loss per week and includes a negative energy balance of [phone] kcal/d;Understanding of distribution of calorie intake throughout the day with the consumption of 4-5 meals/snacks    Improve shortness of breath with ADL's  Yes    Intervention  Provide education, individualized exercise plan and daily activity instruction to help decrease symptoms of SOB with activities of daily living.    Expected Outcomes  Short Term: Improve cardiorespiratory fitness to achieve a reduction of symptoms when performing ADLs;Long Term: Be able to perform more ADLs without symptoms or delay the onset of symptoms        Personal Goals Discharge: Goals and Risk Factor Review    Row Name 05/26/17 1039 06/21/17 1024 07/17/17 1620         Core Components/Risk Factors/Patient Goals Review   Personal Goals Review  Weight Management/Obesity;Improve shortness of breath with ADL's;Develop more efficient breathing techniques such as purse lipped breathing and diaphragmatic breathing and practicing self-pacing with activity.;Stress;Increase knowledge of respiratory medications and ability to use  respiratory devices properly.  Weight Management/Obesity;Improve shortness of breath with ADL's;Other;Hypertension;Develop more efficient breathing techniques such as purse lipped breathing and diaphragmatic breathing and practicing self-pacing with activity.  Weight Management/Obesity;Improve shortness of breath with ADL's;Hypertension     Review  Pt reports less SOB since starting exercise.  He  practices PLB.  His only stress is worry about daughters health - who is having a baby in July.  He saw PCP this morning and he added metamucil .  Pt is taking all meds as directed.  Clair Gulling states he is much more active in the summer - he has not had to use rescue inhaler while doing yard work since beginning exercise.  Clair Gulling feels that his shortness of breath has improved since he started the program. He wants to finish the program and feels that he is not sick as some of the other patients and questions himself being there. Informed him that it is still good to exercise and work on his breathing techniques.     Expected Outcomes  Short - Pt will continue to exercise and take meds as directed Long - Pt will continue healthy habits long term   Short - Clair Gulling will take meds as directed and continue to exercise Long - Clair Gulling will maintain fitness on his own - plans to attend [employer] when he graduates  Short: Attend [facility] regularly to improve shortness of breath with ADL's. Long: maintain independence with ADL's         Exercise Goals and Review: Exercise Goals    Row Name 04/17/17 1625             Exercise Goals   Increase Physical Activity  Yes       Intervention  Develop an individualized exercise prescription for aerobic and resistive training based on initial evaluation findings, risk stratification, comorbidities and participant's personal goals.;Provide advice, education, support and counseling about physical activity/exercise needs.       Expected Outcomes  Short Term: Attend rehab on a  regular basis to increase amount of physical activity.;Long Term: Add in home exercise to make exercise part of routine and to increase amount of physical activity.;Long Term: Exercising regularly at least 3-5 days a week.       Increase Strength and Stamina  Yes       Intervention  Provide advice, education, support and counseling about physical activity/exercise needs.;Develop an individualized exercise prescription for aerobic and resistive training based on initial evaluation findings, risk stratification, comorbidities and participant's personal goals.       Expected Outcomes  Short Term: Increase workloads from initial exercise prescription for resistance, speed, and METs.;Short Term: Perform resistance training exercises routinely during rehab and add in resistance training at home;Long Term: Improve cardiorespiratory fitness, muscular endurance and strength as measured by increased METs and functional capacity (6MWT)       Able to understand and use rate of perceived exertion (RPE) scale  Yes       Intervention  Provide education and explanation on how to use RPE scale       Expected Outcomes  Short Term: Able to use RPE daily in rehab to express subjective intensity level;Long Term:  Able to use RPE to guide intensity level when exercising independently       Able to understand and use Dyspnea scale  Yes       Intervention  Provide education and explanation on how to use Dyspnea scale       Expected Outcomes  Short Term: Able to use Dyspnea scale daily in rehab to express subjective sense of shortness of breath during exertion;Long Term: Able to use Dyspnea scale to guide intensity level when exercising independently       Knowledge and understanding of Target Heart Rate Range (THRR)  Yes  Intervention  Provide education and explanation of THRR including how the numbers were predicted and where they are located for reference       Expected Outcomes  Short Term: Able to state/look up  THRR;Short Term: Able to use daily as guideline for intensity in rehab;Long Term: Able to use THRR to govern intensity when exercising independently       Able to check pulse independently  Yes       Intervention  Provide education and demonstration on how to check pulse in carotid and radial arteries.;Review the importance of being able to check your own pulse for safety during independent exercise       Expected Outcomes  Long Term: Able to check pulse independently and accurately;Short Term: Able to explain why pulse checking is important during independent exercise       Understanding of Exercise Prescription  Yes       Intervention  Provide education, explanation, and written materials on patient's individual exercise prescription       Expected Outcomes  Short Term: Able to explain program exercise prescription;Long Term: Able to explain home exercise prescription to exercise independently          Nutrition & Weight - Outcomes: Pre Biometrics - 04/17/17 1626      Pre Biometrics   Height  5\' 10"  (1.778 m)    Weight  193 lb 8 oz (87.8 kg)    Waist Circumference  37 inches    Hip Circumference  42 inches    Waist to Hip Ratio  0.88 %    BMI (Calculated)  27.76        Nutrition: Nutrition Therapy & Goals - 04/26/17 1028      Nutrition Therapy   Diet  TLC    Protein (specify units)  12oz    Fiber  30 grams    Whole Grain Foods  3 servings    Saturated Fats  16 max. grams    Fruits and Vegetables  6 servings/day   8 ideal   Sodium  2000 grams      Personal Nutrition Goals   Nutrition Goal  Pay closer attention to the amount of sodium you consume daily. Try not to add salt at meal times ; use herbs, spices, citrus and other salt-free seasonings to flavor your food instead. This helps limit/prevent fluid retention    Personal Goal #2  Limit fried foods, particularly when eating out. Fried foods are high in sodium and trans/saturated fat    Personal Goal #3  Continue to cut down  on the amount of calories consumed from alcohol (wine) each week    Personal Goal #4  Drink more water, aiming for eight, 8oz glasses per day ideally to help thin mucus and make breathing easier    Comments  He and his wife have been working to include more vegetables daily and to fry less meat at home. Currently he does not monitor sodium intake       Intervention Plan   Intervention  Prescribe, educate and counsel regarding individualized specific dietary modifications aiming towards targeted core components such as weight, hypertension, lipid management, diabetes, heart failure and other comorbidities.;Nutrition handout(s) given to patient.   Nutrition Guidelines for COPD   Expected Outcomes  Short Term Goal: Understand basic principles of dietary content, such as calories, fat, sodium, cholesterol and nutrients.;Short Term Goal: A plan has been developed with personal nutrition goals set during dietitian appointment.;Long Term Goal: Adherence to prescribed nutrition  plan.       Nutrition Discharge: Nutrition Assessments - 04/17/17 1500      MEDFICTS Scores   Pre Score  76       Education Questionnaire Score: Knowledge Questionnaire Score - 04/17/17 1505      Knowledge Questionnaire Score   Pre Score  13/18   reviewed with patient      Goals reviewed with patient; copy given to patient.

## 2017-08-28 NOTE — Progress Notes (Signed)
Pulmonary Individual Treatment Plan  Patient Details  Name: LANGFORD CARIAS MRN: [id number] Date of Birth: [birth year] Referring Provider:     Pulmonary Rehab from 04/17/2017 in [facility] Cardiac and Pulmonary Rehab  Referring Provider  Merton Border MD      Initial Encounter Date:    Pulmonary Rehab from 04/17/2017 in [facility] Cardiac and Pulmonary Rehab  Date  04/17/17      Visit Diagnosis: COPD, mild ([place])  Patient's Home Medications on Admission:  Current Outpatient Medications:  .  doxazosin (CARDURA) 4 MG tablet, , Disp: , Rfl:  .  gabapentin (NEURONTIN) 100 MG capsule, TAKE 1 CAPSULE (100 MG TOTAL) BY MOUTH 3 (THREE) TIMES DAILY., Disp: , Rfl: 3 .  NASACORT ALLERGY 24HR 55 MCG/ACT AERO nasal inhaler, , Disp: , Rfl:  .  primidone (MYSOLINE) 50 MG tablet, Take 1 tablet by mouth 3 (three) times daily. , Disp: , Rfl:  .  propranolol ER (INDERAL LA) 60 MG 24 hr capsule, Take 60 mg by mouth daily. , Disp: , Rfl:  .  umeclidinium-vilanterol (ANORO ELLIPTA) 62.5-25 MCG/INH AEPB, Inhale 1 puff into the lungs daily., Disp: 180 each, Rfl: 2 .  VENTOLIN HFA 108 (90 Base) MCG/ACT inhaler, INHALE 2 PUFFS INTO THE LUNGS EVERY 6 (SIX) HOURS AS NEEDED FOR WHEEZING OR SHORTNESS OF BREATH., Disp: 1 Inhaler, Rfl: 0  Past Medical History: Past Medical History:  Diagnosis Date  . Anxiety   . Back pain   . BPH (benign prostatic hyperplasia)   . Cancer (HCC)    BASAL CELL  . Chest pain, non-cardiac   . Colon polyps   . COPD (chronic obstructive pulmonary disease) ([place])   . Depression   . Essential tremor   . Essential tremor   . Family history of adverse reaction to anesthesia    DAUGHTERS GET NAUSEATED  . GERD (gastroesophageal reflux disease)    H/O  . Helicobacter pylori (H. pylori) infection   . Hyperlipidemia   . Hypertension   . Neuromuscular disorder ([place])    TREMORS  . Reactive airway disease   . Reactive airway disease   . Restless leg syndrome     Tobacco Use: Social History    Tobacco Use  Smoking Status Former Smoker  . Packs/day: 1.00  . Years: 20.00  . Pack years: 20.00  . Types: Cigarettes  . Last attempt to quit: 01/04/1988  . Years since quitting: 29.6  Smokeless Tobacco Never Used    Labs: Recent Review Flowsheet Data    There is no flowsheet data to display.       Pulmonary Assessment Scores: Pulmonary Assessment Scores    Row Name 04/17/17 1501         ADL UCSD   ADL Phase  Entry     SOB Score total  37     Rest  1     Walk  2     Stairs  3     Bath  1     Dress  1     Shop  2       CAT Score   CAT Score  10       mMRC Score   mMRC Score  1        Pulmonary Function Assessment: Pulmonary Function Assessment - 04/17/17 1504      Pulmonary Function Tests   FVC%  75 %   Test performed on 10/30/13   FEV1%  68 %    FEV1/FVC Ratio  71      Breath   Bilateral Breath Sounds  Clear;Decreased    Shortness of Breath  No;Limiting activity       Exercise Target Goals: Exercise Program Goal: Individual exercise prescription set using results from initial 6 min walk test and THRR while considering  patient's activity barriers and safety.   Exercise Prescription Goal: Initial exercise prescription builds to 30-45 minutes a day of aerobic activity, 2-3 days per week.  Home exercise guidelines will be given to patient during program as part of exercise prescription that the participant will acknowledge.  Activity Barriers & Risk Stratification: Activity Barriers & Cardiac Risk Stratification - 04/17/17 1623      Activity Barriers & Cardiac Risk Stratification   Activity Barriers  Deconditioning;Muscular Weakness;Shortness of Breath       6 Minute Walk: 6 Minute Walk    Row Name 04/17/17 1621         6 Minute Walk   Phase  Initial     Distance  1185 feet     Walk Time  6 minutes     # of Rest Breaks  0     MPH  2.24     METS  2.1     RPE  9     Perceived Dyspnea   1     VO2 Peak  7.36     Symptoms  No      Resting HR  68 bpm     Resting BP  122/64     Resting Oxygen Saturation   94 %     Exercise Oxygen Saturation  during 6 min walk  90 %     Max Ex. HR  78 bpm     Max Ex. BP  126/64     2 Minute Post BP  122/60       Interval HR   6 Minute HR  78     2 Minute Post HR  74     Interval Heart Rate?  Yes       Interval Oxygen   Interval Oxygen?  Yes     Baseline Oxygen Saturation %  94 %     1 Minute Oxygen Saturation %  90 %     1 Minute Liters of Oxygen  0 L Room Air     2 Minute Oxygen Saturation %  90 %     2 Minute Liters of Oxygen  0 L     3 Minute Oxygen Saturation %  90 %     3 Minute Liters of Oxygen  0 L     4 Minute Oxygen Saturation %  90 %     4 Minute Liters of Oxygen  0 L     5 Minute Oxygen Saturation %  90 %     5 Minute Liters of Oxygen  0 L     6 Minute Oxygen Saturation %  91 %     6 Minute Liters of Oxygen  0 L     2 Minute Post Oxygen Saturation %  94 %     2 Minute Post Liters of Oxygen  0 L       Oxygen Initial Assessment: Oxygen Initial Assessment - 04/17/17 1505      Home Oxygen   Home Oxygen Device  None    Sleep Oxygen Prescription  None    Home Exercise Oxygen Prescription  None    Home at Rest Exercise Oxygen Prescription  None      Initial 6 min Walk   Oxygen Used  None      Program Oxygen Prescription   Program Oxygen Prescription  None      Intervention   Short Term Goals  To learn and demonstrate proper use of respiratory medications;To learn and understand importance of maintaining oxygen saturations>88%;To learn and demonstrate proper pursed lip breathing techniques or other breathing techniques.;To learn and understand importance of monitoring SPO2 with pulse oximeter and demonstrate accurate use of the pulse oximeter.    Long  Term Goals  Verbalizes importance of monitoring SPO2 with pulse oximeter and return demonstration;Maintenance of O2 saturations>88%;Exhibits proper breathing techniques, such as pursed lip breathing or other  method taught during program session;Compliance with respiratory medication;Demonstrates proper use of MDI's       Oxygen Re-Evaluation: Oxygen Re-Evaluation    Row Name 04/19/17 1010 06/21/17 1038 07/17/17 1622         Program Oxygen Prescription   Program Oxygen Prescription  None  None  None       Home Oxygen   Home Oxygen Device  None  None  None     Sleep Oxygen Prescription  None  None  None     Home Exercise Oxygen Prescription  None  None  None     Home at Rest Exercise Oxygen Prescription  None  None  None       Goals/Expected Outcomes   Short Term Goals  To learn and demonstrate proper use of respiratory medications;To learn and understand importance of maintaining oxygen saturations>88%;To learn and demonstrate proper pursed lip breathing techniques or other breathing techniques.;To learn and understand importance of monitoring SPO2 with pulse oximeter and demonstrate accurate use of the pulse oximeter.  To learn and demonstrate proper use of respiratory medications;To learn and understand importance of maintaining oxygen saturations>88%;To learn and demonstrate proper pursed lip breathing techniques or other breathing techniques.;To learn and understand importance of monitoring SPO2 with pulse oximeter and demonstrate accurate use of the pulse oximeter.  To learn and demonstrate proper use of respiratory medications;To learn and understand importance of maintaining oxygen saturations>88%;To learn and demonstrate proper pursed lip breathing techniques or other breathing techniques.;To learn and understand importance of monitoring SPO2 with pulse oximeter and demonstrate accurate use of the pulse oximeter.     Long  Term Goals  Verbalizes importance of monitoring SPO2 with pulse oximeter and return demonstration;Maintenance of O2 saturations>88%;Exhibits proper breathing techniques, such as pursed lip breathing or other method taught during program session;Compliance with  respiratory medication;Demonstrates proper use of MDI's  Verbalizes importance of monitoring SPO2 with pulse oximeter and return demonstration;Maintenance of O2 saturations>88%;Exhibits proper breathing techniques, such as pursed lip breathing or other method taught during program session;Compliance with respiratory medication;Demonstrates proper use of MDI's  Verbalizes importance of monitoring SPO2 with pulse oximeter and return demonstration;Maintenance of O2 saturations>88%;Exhibits proper breathing techniques, such as pursed lip breathing or other method taught during program session;Compliance with respiratory medication;Demonstrates proper use of MDI's     Comments  Reviewed PLB technique with pt.  Talked about how it work and it's important to maintaining his exercise saturations.    Euel does not have to use his rescue inhaler when exercising. He does not check his oxygen at home due to not having a pulse oximeter. Informed him where to get one and why it is important to check his oxygen at home.  Jim's oxygen has been doing well in the program. He has  not fallen below 90 percent when exercisisng. Informed him to check his oxygen at home or when he is working.      Goals/Expected Outcomes  Short: Become more profiecient at using PLB.   Long: Become independent at using PLB.  Short: obtain a pulse oximeter. Long: check oxygen at home independently with pulse oximeter.  Short: monitor oxygen at home with exertion. Long: maintain oxygen saturations above 88 percent independently.        Oxygen Discharge (Final Oxygen Re-Evaluation): Oxygen Re-Evaluation - 07/17/17 1622      Program Oxygen Prescription   Program Oxygen Prescription  None      Home Oxygen   Home Oxygen Device  None    Sleep Oxygen Prescription  None    Home Exercise Oxygen Prescription  None    Home at Rest Exercise Oxygen Prescription  None      Goals/Expected Outcomes   Short Term Goals  To learn and demonstrate proper use  of respiratory medications;To learn and understand importance of maintaining oxygen saturations>88%;To learn and demonstrate proper pursed lip breathing techniques or other breathing techniques.;To learn and understand importance of monitoring SPO2 with pulse oximeter and demonstrate accurate use of the pulse oximeter.    Long  Term Goals  Verbalizes importance of monitoring SPO2 with pulse oximeter and return demonstration;Maintenance of O2 saturations>88%;Exhibits proper breathing techniques, such as pursed lip breathing or other method taught during program session;Compliance with respiratory medication;Demonstrates proper use of MDI's    Comments  Jim's oxygen has been doing well in the program. He has not fallen below 90 percent when exercisisng. Informed him to check his oxygen at home or when he is working.     Goals/Expected Outcomes  Short: monitor oxygen at home with exertion. Long: maintain oxygen saturations above 88 percent independently.       Initial Exercise Prescription: Initial Exercise Prescription - 04/17/17 1600      Date of Initial Exercise RX and Referring Provider   Date  04/17/17    Referring Provider  Merton Border MD      Treadmill   MPH  1.4    Grade  0.5    Minutes  15    METs  2.17      Recumbant Elliptical   Level  1    RPM  50    Minutes  15    METs  2      T5 Nustep   Level  1    SPM  80    Minutes  15    METs  2      Prescription Details   Frequency (times per week)  3    Duration  Progress to 45 minutes of aerobic exercise without signs/symptoms of physical distress      Intensity   THRR 40-80% of Max Heartrate  98-128    Ratings of Perceived Exertion  11-13    Perceived Dyspnea  0-4      Progression   Progression  Continue to progress workloads to maintain intensity without signs/symptoms of physical distress.      Resistance Training   Training Prescription  Yes    Weight  3 lbs    Reps  10-15       Perform Capillary Blood  Glucose checks as needed.  Exercise Prescription Changes: Exercise Prescription Changes    Row Name 04/17/17 1600 04/26/17 1100 04/27/17 1000 05/10/17 1500 05/24/17 1200     Response to Exercise   Blood Pressure (  Admit)  122/64  -  116/60  110/62  116/56   Blood Pressure (Exercise)  126/64  -  -  -  -   Blood Pressure (Exit)  122/60  -  106/64  118/70  124/64   Heart Rate (Admit)  68 bpm  -  67 bpm  87 bpm  76 bpm   Heart Rate (Exercise)  78 bpm  -  90 bpm  85 bpm  84 bpm   Heart Rate (Exit)  74 bpm  -  67 bpm  66 bpm  70 bpm   Oxygen Saturation (Admit)  94 %  -  95 %  94 %  93 %   Oxygen Saturation (Exercise)  90 %  -  93 %  94 %  92 %   Oxygen Saturation (Exit)  94 %  -  93 %  96 %  94 %   Rating of Perceived Exertion (Exercise)  9  -  11  12  11    Perceived Dyspnea (Exercise)  1  -  1  1  3    Symptoms  none  -  none  none  none   Comments  walk test results  -  -  -  -   Duration  -  -  Continue with 45 min of aerobic exercise without signs/symptoms of physical distress.  Continue with 45 min of aerobic exercise without signs/symptoms of physical distress.  Continue with 45 min of aerobic exercise without signs/symptoms of physical distress.   Intensity  -  -  THRR unchanged  THRR unchanged  THRR unchanged     Progression   Progression  -  -  Continue to progress workloads to maintain intensity without signs/symptoms of physical distress.  Continue to progress workloads to maintain intensity without signs/symptoms of physical distress.  Continue to progress workloads to maintain intensity without signs/symptoms of physical distress.   Average METs  -  -  1.95  2.4  2.9     Resistance Training   Training Prescription  -  -  Yes  Yes  Yes   Weight  -  -  3 lb  3 lb  6 lb   Reps  -  -  10-15  10-15  10-15     Interval Training   Interval Training  -  -  -  No  No     Treadmill   MPH  -  -  -  1.4  1.7   Grade  -  -  -  2.5  3   Minutes  -  -  -  15  15   METs  -  -  -  2.5  3      Recumbant Elliptical   Level  -  -  2  3  -   RPM  -  -  48  -  -   Minutes  -  -  15  15  -   METs  -  -  2.1  2.3  -     T5 Nustep   Level  -  -  2  3  5    SPM  -  -  80  88  73   Minutes  -  -  15  15  15    METs  -  -  1.8  2.2  2.8     Home Exercise Plan   Plans to continue exercise at  -  [employer] (comment) [employer] at [employer], [employer] (comment) [employer], [employer] (comment) [employer] at [employer], [employer] (comment) [employer]   Frequency  -  Add 1 additional day to program exercise sessions.  Add 1 additional day to program exercise sessions.  Add 1 additional day to program exercise sessions.  Add 1 additional day to program exercise sessions.   Initial Home Exercises Provided  -  04/26/17  04/26/17  04/26/17  04/26/17   Row Name 06/07/17 1200 06/21/17 1500 07/05/17 1000 07/19/17 1100 07/19/17 1200     Response to Exercise   Blood Pressure (Admit)  132/64  138/70  123/64  124/62  -   Blood Pressure (Exit)  118/64  128/60  118/60  106/66  -   Heart Rate (Admit)  80 bpm  72 bpm  69 bpm  73 bpm  -   Heart Rate (Exercise)  88 bpm  84 bpm  90 bpm  85 bpm  -   Heart Rate (Exit)  66 bpm  67 bpm  68 bpm  73 bpm  -   Oxygen Saturation (Admit)  94 %  95 %  93 %  94 %  -   Oxygen Saturation (Exercise)  91 %  91 %  92 %  93 %  -   Oxygen Saturation (Exit)  95 %  97 %  94 %  95 %  -   Rating of Perceived Exertion (Exercise)  13  12  12  12   -   Perceived Dyspnea (Exercise)  2  2  2  2   -   Symptoms  none  none  none  none  -   Duration  Continue with 45 min of aerobic exercise without signs/symptoms of physical distress.  Continue with 45 min of aerobic exercise without signs/symptoms of physical distress.  Continue with 45 min of aerobic exercise without signs/symptoms of physical distress.  Continue with 45 min of aerobic exercise without signs/symptoms of physical distress.   -   Intensity  THRR unchanged  Other (comment) progressing workloads - not reaching THR  THRR unchanged  THRR unchanged  -     Progression   Progression  Continue to progress workloads to maintain intensity without signs/symptoms of physical distress.  Continue to progress workloads to maintain intensity without signs/symptoms of physical distress.  Continue to progress workloads to maintain intensity without signs/symptoms of physical distress.  Continue to progress workloads to maintain intensity without signs/symptoms of physical distress.  -   Average METs  2.8  2.4  2.61  2.63  -     Resistance Training   Training Prescription  Yes  Yes  Yes  Yes  -   Weight  6 lb  5 lb  5 lb  4 lb  -   Reps  10-15  10-15  10-15  10-15  -     Interval Training   Interval Training  No  No  No  No  -     Treadmill   MPH  1.3  1.5  2  2   -   Grade  7  2  2.5  2.5  -   Minutes  15  15  15  15   -   METs  3  2.56  3.22  3.22  -     Recumbant Elliptical   Level  4  -  3  4  -   RPM  48  -  51  50  -   Minutes  15  -  15  15  -   METs  2.9  -  2.4  1.8  -     T5 Nustep   Level  4  4  3   -  -   SPM  75  77  99  -  -   Minutes  15  15  15   -  -   METs  2.4  2.3  2.2  -  -     Home Exercise Plan   Plans to continue exercise at  [employer] (comment) [employer] at [employer] [place] (comment) [employer] at [employer], [employer] (comment) [employer]  -  [employer] (comment) [employer]   Frequency  Add 1 additional day to program exercise sessions.  Add 1 additional day to program exercise sessions.  Add 2 additional days to program exercise sessions.  -  Add 2 additional days to program exercise sessions.   Initial Home Exercises Provided  04/26/17  04/26/17  04/26/17  -  04/26/17   Row Name 08/02/17 1300 08/18/17 0800           Response to Exercise   Blood Pressure (Admit)  126/62  130/70      Blood  Pressure (Exit)  128/60  126/68      Heart Rate (Admit)  73 bpm  72 bpm      Heart Rate (Exercise)  85 bpm  77 bpm      Heart Rate (Exit)  68 bpm  62 bpm      Oxygen Saturation (Admit)  93 %  92 %      Oxygen Saturation (Exercise)  94 %  90 %      Oxygen Saturation (Exit)  94 %  94 %      Rating of Perceived Exertion (Exercise)  13  11      Perceived Dyspnea (Exercise)  2  2      Symptoms  none  none      Duration  Continue with 45 min of aerobic exercise without signs/symptoms of physical distress.  Continue with 45 min of aerobic exercise without signs/symptoms of physical distress.      Intensity  THRR unchanged  THRR unchanged        Progression   Progression  Continue to progress workloads to maintain intensity without signs/symptoms of physical distress.  Continue to progress workloads to maintain intensity without signs/symptoms of physical distress.      Average METs  2.75  2.7        Resistance Training   Training Prescription  Yes  Yes      Weight  4 lb  4 lb      Reps  10-15  10-15        Interval Training   Interval Training  No  No        Treadmill   MPH  2  2      Grade  2.5  2.5      Minutes  15  15      METs  3.22  3.22        Recumbant Elliptical   Level  4  4      RPM  50  50      Minutes  15  15      METs  2.2  2.2        T5 Nustep   Level  4  4      Minutes  15  15      METs  2.3  2.2        Home Exercise Plan   Plans to continue exercise at  [facility] (comment) [employer] at [employer] [place] (comment) [employer]      Frequency  Add 2 additional days to program exercise sessions.  Add 2 additional days to program exercise sessions.      Initial Home Exercises Provided  04/26/17  04/26/17         Exercise Comments: Exercise Comments    Row Name 04/19/17 1009 04/26/17 1125         Exercise Comments  First full day of exercise!  Patient was oriented to gym and equipment including functions,  settings, policies, and procedures.  Patient's individual exercise prescription and treatment plan were reviewed.  All starting workloads were established based on the results of the 6 minute walk test done at initial orientation visit.  The plan for exercise progression was also introduced and progression will be customized based on patient's performance and goals.  .Reviewed home exercise with pt today.  Pt plans to go to [facility] for exercise.  Reviewed THR, pulse, RPE, sign and symptoms, NTG use, and when to call 911 or MD.  Also discussed weather considerations and indoor options.  Pt voiced understanding.         Exercise Goals and Review: Exercise Goals    Row Name 04/17/17 1625             Exercise Goals   Increase Physical Activity  Yes       Intervention  Develop an individualized exercise prescription for aerobic and resistive training based on initial evaluation findings, risk stratification, comorbidities and participant's personal goals.;Provide advice, education, support and counseling about physical activity/exercise needs.       Expected Outcomes  Short Term: Attend rehab on a regular basis to increase amount of physical activity.;Long Term: Add in home exercise to make exercise part of routine and to increase amount of physical activity.;Long Term: Exercising regularly at least 3-5 days a week.       Increase Strength and Stamina  Yes       Intervention  Provide advice, education, support and counseling about physical activity/exercise needs.;Develop an individualized exercise prescription for aerobic and resistive training based on initial evaluation findings, risk stratification, comorbidities and participant's personal goals.       Expected Outcomes  Short Term: Increase workloads from initial exercise prescription for resistance, speed, and METs.;Short Term: Perform resistance training exercises routinely during rehab and add in resistance training at home;Long Term:  Improve cardiorespiratory fitness, muscular endurance and strength as measured by increased METs and functional capacity (6MWT)       Able to understand and use rate of perceived exertion (RPE) scale  Yes       Intervention  Provide education and explanation on how to use RPE scale       Expected Outcomes  Short Term: Able to use RPE daily in rehab to express subjective intensity level;Long Term:  Able to use RPE to guide intensity level when exercising independently       Able to understand and use Dyspnea scale  Yes  Intervention  Provide education and explanation on how to use Dyspnea scale       Expected Outcomes  Short Term: Able to use Dyspnea scale daily in rehab to express subjective sense of shortness of breath during exertion;Long Term: Able to use Dyspnea scale to guide intensity level when exercising independently       Knowledge and understanding of Target Heart Rate Range (THRR)  Yes       Intervention  Provide education and explanation of THRR including how the numbers were predicted and where they are located for reference       Expected Outcomes  Short Term: Able to state/look up THRR;Short Term: Able to use daily as guideline for intensity in rehab;Long Term: Able to use THRR to govern intensity when exercising independently       Able to check pulse independently  Yes       Intervention  Provide education and demonstration on how to check pulse in carotid and radial arteries.;Review the importance of being able to check your own pulse for safety during independent exercise       Expected Outcomes  Long Term: Able to check pulse independently and accurately;Short Term: Able to explain why pulse checking is important during independent exercise       Understanding of Exercise Prescription  Yes       Intervention  Provide education, explanation, and written materials on patient's individual exercise prescription       Expected Outcomes  Short Term: Able to explain program exercise  prescription;Long Term: Able to explain home exercise prescription to exercise independently          Exercise Goals Re-Evaluation : Exercise Goals Re-Evaluation    Row Name 04/19/17 1009 04/27/17 1027 05/10/17 1539 05/24/17 1218 06/07/17 1233     Exercise Goal Re-Evaluation   Exercise Goals Review  Understanding of Exercise Prescription;Able to understand and use Dyspnea scale;Knowledge and understanding of Target Heart Rate Range (THRR);Able to understand and use rate of perceived exertion (RPE) scale  Increase Physical Activity;Able to understand and use rate of perceived exertion (RPE) scale;Able to understand and use Dyspnea scale;Increase Strength and Stamina  Increase Physical Activity;Able to understand and use rate of perceived exertion (RPE) scale;Knowledge and understanding of Target Heart Rate Range (THRR);Increase Strength and Stamina;Able to understand and use Dyspnea scale  Increase Physical Activity;Able to understand and use rate of perceived exertion (RPE) scale;Increase Strength and Stamina;Able to understand and use Dyspnea scale  Increase Physical Activity;Able to understand and use rate of perceived exertion (RPE) scale;Increase Strength and Stamina;Able to understand and use Dyspnea scale   Comments  Reviewed RPE scale, THR and program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 health.   Sleep Hygiene: -Provides group verbal and written instruction about how sleep can affect your health.  Define sleep hygiene,  discuss sleep cycles and impact of sleep habits. Review good sleep hygiene tips.    Pulmonary Rehab from 07/24/2017 in [facility] Cardiac and Pulmonary Rehab  Date  06/28/17  Educator  [facility]  Instruction Review Code  1- Verbalizes Understanding      Other: -Provides group and verbal instruction on various topics (see comments)    Knowledge Questionnaire Score: Knowledge Questionnaire Score - 04/17/17 1505      Knowledge Questionnaire Score   Pre Score  13/18   reviewed with patient       Core Components/Risk Factors/Patient Goals at Admission: Personal Goals and Risk Factors at Admission - 04/17/17 1506      Core Components/Risk Factors/Patient Goals on Admission    Weight Management  Yes;Weight Maintenance;Weight Loss    Intervention  Weight Management: Develop a combined nutrition and exercise program designed to reach desired caloric intake, while maintaining appropriate intake of nutrient and fiber, sodium and fats, and appropriate energy expenditure required for the weight goal.;Weight Management: Provide education and appropriate resources to help participant work on and attain dietary goals.;Weight Management/Obesity: Establish reasonable short term and long term weight goals.    Admit Weight  193 lb 8 oz (87.8 kg)    Goal Weight: Short Term  188 lb (85.3 kg)    Goal Weight: Long Term  180 lb (81.6 kg)    Expected Outcomes  Short Term: Continue to assess and modify interventions until short term weight is achieved;Long Term: Adherence to nutrition and physical activity/exercise program aimed toward attainment of established weight goal;Weight Maintenance: Understanding of the daily nutrition guidelines, which includes 25-35% calories from fat, 7% or less cal from saturated fats, less than 278m cholesterol, less than 1.5gm of sodium, & 5 or more servings of fruits and vegetables daily;Understanding recommendations for meals to include 15-35% energy as protein, 25-35% energy from fat,  35-60% energy from carbohydrates, less than 2040mof dietary cholesterol, 20-35 gm of total fiber daily;Weight Loss: Understanding of general recommendations for a balanced deficit meal plan, which promotes 1-2 lb weight loss per week and includes a negative energy balance of [phone] kcal/d;Understanding of distribution of calorie intake throughout the day with the consumption of 4-5 meals/snacks    Improve shortness of breath with ADL's  Yes    Intervention  Provide education, individualized exercise plan and daily activity instruction to help decrease symptoms of SOB with activities of daily living.    Expected Outcomes  Short Term: Improve cardiorespiratory fitness to achieve a reduction of symptoms when performing ADLs;Long Term: Be able to perform more ADLs without symptoms or delay the onset of symptoms       Core Components/Risk Factors/Patient Goals Review:  Goals and Risk Factor Review    Row Name 05/26/17 1039 06/21/17 1024 07/17/17 1620         Core Components/Risk Factors/Patient Goals Review   Personal Goals Review  Weight Management/Obesity;Improve shortness of breath with ADL's;Develop more efficient breathing techniques such as purse lipped breathing and diaphragmatic breathing and practicing self-pacing with activity.;Stress;Increase knowledge of respiratory medications and ability to use respiratory devices properly.  Weight Management/Obesity;Improve shortness of breath with ADL's;Other;Hypertension;Develop more efficient breathing techniques such as purse lipped breathing and diaphragmatic breathing and practicing self-pacing with activity.  Weight  Management/Obesity;Improve shortness of breath with ADL's;Hypertension     Review  Pt reports less SOB since starting exercise.  He practices PLB.  His only stress is worry about daughters health - who is having a baby in July.  He saw PCP this morning and he added metamucil .  Pt is taking all meds as directed.  Clair Gulling states he is much  more active in the summer - he has not had to use rescue inhaler while doing yard work since beginning exercise.  Clair Gulling feels that his shortness of breath has improved since he started the program. He wants to finish the program and feels that he is not sick as some of the other patients and questions himself being there. Informed him that it is still good to exercise and work on his breathing techniques.     Expected Outcomes  Short - Pt will continue to exercise and take meds as directed Long - Pt will continue healthy habits long term   Short - Clair Gulling will take meds as directed and continue to exercise Long - Clair Gulling will maintain fitness on his own - plans to attend [employer] when he graduates  Short: Attend [facility] regularly to improve shortness of breath with ADL's. Long: maintain independence with ADL's         Core Components/Risk Factors/Patient Goals at Discharge (Final Review):  Goals and Risk Factor Review - 07/17/17 1620      Core Components/Risk Factors/Patient Goals Review   Personal Goals Review  Weight Management/Obesity;Improve shortness of breath with ADL's;Hypertension    Review  Clair Gulling feels that his shortness of breath has improved since he started the program. He wants to finish the program and feels that he is not sick as some of the other patients and questions himself being there. Informed him that it is still good to exercise and work on his breathing techniques.    Expected Outcomes  Short: Attend [facility] regularly to improve shortness of breath with ADL's. Long: maintain independence with ADL's        ITP Comments: ITP Comments    Row Name 04/17/17 1441 05/08/17 0821 06/05/17 0817 07/03/17 0942 07/31/17 0839   ITP Comments  Medical Evaluation completed. Chart sent for review and changes to Dr. Emily Filbert Director of [place]. Diagnosis can be found in [facility] encounter 11/20/14   30 day review completed. ITP sent to Dr. Emily Filbert Director of [place].  Continue with ITP unless changes are made by physician   30 day review completed. ITP sent to Dr. Emily Filbert Director of [place]. Continue with ITP unless changes are made by physician  30 day review completed. ITP sent to Dr. Emily Filbert Director of [place]. Continue with ITP unless changes are made by physician  30 day review completed. ITP sent to Dr. Emily Filbert Director of [place]. Continue with ITP unless changes are made by physician   Row Name 08/25/17 1039 08/28/17 0819         ITP Comments  Patient states he has been missing [facility] due to a new grandchild. He has been out of town and plans to return Friday.  30 day review completed. ITP sent to Dr. Emily Filbert Director of [place]. Continue with ITP unless changes are made by physician         Comments: 30 day review

## 2017-08-28 NOTE — Progress Notes (Signed)
Pulmonary Individual Treatment Plan  Patient Details  Name: Wesley Preston MRN: [id number] Date of Birth: [birth year] Referring Provider:     Pulmonary Rehab from 04/17/2017 in [facility] Cardiac and Pulmonary Rehab  Referring Provider  Merton Border MD      Initial Encounter Date:    Pulmonary Rehab from 04/17/2017 in [facility] Cardiac and Pulmonary Rehab  Date  04/17/17      Visit Diagnosis: COPD, mild ([place])  Patient's Home Medications on Admission:  Current Outpatient Medications:  .  doxazosin (CARDURA) 4 MG tablet, , Disp: , Rfl:  .  gabapentin (NEURONTIN) 100 MG capsule, TAKE 1 CAPSULE (100 MG TOTAL) BY MOUTH 3 (THREE) TIMES DAILY., Disp: , Rfl: 3 .  NASACORT ALLERGY 24HR 55 MCG/ACT AERO nasal inhaler, , Disp: , Rfl:  .  primidone (MYSOLINE) 50 MG tablet, Take 1 tablet by mouth 3 (three) times daily. , Disp: , Rfl:  .  propranolol ER (INDERAL LA) 60 MG 24 hr capsule, Take 60 mg by mouth daily. , Disp: , Rfl:  .  umeclidinium-vilanterol (ANORO ELLIPTA) 62.5-25 MCG/INH AEPB, Inhale 1 puff into the lungs daily., Disp: 180 each, Rfl: 2 .  VENTOLIN HFA 108 (90 Base) MCG/ACT inhaler, INHALE 2 PUFFS INTO THE LUNGS EVERY 6 (SIX) HOURS AS NEEDED FOR WHEEZING OR SHORTNESS OF BREATH., Disp: 1 Inhaler, Rfl: 0  Past Medical History: Past Medical History:  Diagnosis Date  . Anxiety   . Back pain   . BPH (benign prostatic hyperplasia)   . Cancer (HCC)    BASAL CELL  . Chest pain, non-cardiac   . Colon polyps   . COPD (chronic obstructive pulmonary disease) ([place])   . Depression   . Essential tremor   . Essential tremor   . Family history of adverse reaction to anesthesia    DAUGHTERS GET NAUSEATED  . GERD (gastroesophageal reflux disease)    H/O  . Helicobacter pylori (H. pylori) infection   . Hyperlipidemia   . Hypertension   . Neuromuscular disorder ([place])    TREMORS  . Reactive airway disease   . Reactive airway disease   . Restless leg syndrome     Tobacco Use: Social History    Tobacco Use  Smoking Status Former Smoker  . Packs/day: 1.00  . Years: 20.00  . Pack years: 20.00  . Types: Cigarettes  . Last attempt to quit: 01/04/1988  . Years since quitting: 29.6  Smokeless Tobacco Never Used    Labs: Recent Review Flowsheet Data    There is no flowsheet data to display.       Pulmonary Assessment Scores: Pulmonary Assessment Scores    Row Name 04/17/17 1501         ADL UCSD   ADL Phase  Entry     SOB Score total  37     Rest  1     Walk  2     Stairs  3     Bath  1     Dress  1     Shop  2       CAT Score   CAT Score  10       mMRC Score   mMRC Score  1        Pulmonary Function Assessment: Pulmonary Function Assessment - 04/17/17 1504      Pulmonary Function Tests   FVC%  75 %   Test performed on 10/30/13   FEV1%  68 %    FEV1/FVC Ratio  71      Breath   Bilateral Breath Sounds  Clear;Decreased    Shortness of Breath  No;Limiting activity       Exercise Target Goals: Exercise Program Goal: Individual exercise prescription set using results from initial 6 min walk test and THRR while considering  patient's activity barriers and safety.   Exercise Prescription Goal: Initial exercise prescription builds to 30-45 minutes a day of aerobic activity, 2-3 days per week.  Home exercise guidelines will be given to patient during program as part of exercise prescription that the participant will acknowledge.  Activity Barriers & Risk Stratification: Activity Barriers & Cardiac Risk Stratification - 04/17/17 1623      Activity Barriers & Cardiac Risk Stratification   Activity Barriers  Deconditioning;Muscular Weakness;Shortness of Breath       6 Minute Walk: 6 Minute Walk    Row Name 04/17/17 1621         6 Minute Walk   Phase  Initial     Distance  1185 feet     Walk Time  6 minutes     # of Rest Breaks  0     MPH  2.24     METS  2.1     RPE  9     Perceived Dyspnea   1     VO2 Peak  7.36     Symptoms  No      Resting HR  68 bpm     Resting BP  122/64     Resting Oxygen Saturation   94 %     Exercise Oxygen Saturation  during 6 min walk  90 %     Max Ex. HR  78 bpm     Max Ex. BP  126/64     2 Minute Post BP  122/60       Interval HR   6 Minute HR  78     2 Minute Post HR  74     Interval Heart Rate?  Yes       Interval Oxygen   Interval Oxygen?  Yes     Baseline Oxygen Saturation %  94 %     1 Minute Oxygen Saturation %  90 %     1 Minute Liters of Oxygen  0 L Room Air     2 Minute Oxygen Saturation %  90 %     2 Minute Liters of Oxygen  0 L     3 Minute Oxygen Saturation %  90 %     3 Minute Liters of Oxygen  0 L     4 Minute Oxygen Saturation %  90 %     4 Minute Liters of Oxygen  0 L     5 Minute Oxygen Saturation %  90 %     5 Minute Liters of Oxygen  0 L     6 Minute Oxygen Saturation %  91 %     6 Minute Liters of Oxygen  0 L     2 Minute Post Oxygen Saturation %  94 %     2 Minute Post Liters of Oxygen  0 L       Oxygen Initial Assessment: Oxygen Initial Assessment - 04/17/17 1505      Home Oxygen   Home Oxygen Device  None    Sleep Oxygen Prescription  None    Home Exercise Oxygen Prescription  None    Home at Rest Exercise Oxygen Prescription  None      Initial 6 min Walk   Oxygen Used  None      Program Oxygen Prescription   Program Oxygen Prescription  None      Intervention   Short Term Goals  To learn and demonstrate proper use of respiratory medications;To learn and understand importance of maintaining oxygen saturations>88%;To learn and demonstrate proper pursed lip breathing techniques or other breathing techniques.;To learn and understand importance of monitoring SPO2 with pulse oximeter and demonstrate accurate use of the pulse oximeter.    Long  Term Goals  Verbalizes importance of monitoring SPO2 with pulse oximeter and return demonstration;Maintenance of O2 saturations>88%;Exhibits proper breathing techniques, such as pursed lip breathing or other  method taught during program session;Compliance with respiratory medication;Demonstrates proper use of MDI's       Oxygen Re-Evaluation: Oxygen Re-Evaluation    Row Name 04/19/17 1010 06/21/17 1038 07/17/17 1622         Program Oxygen Prescription   Program Oxygen Prescription  None  None  None       Home Oxygen   Home Oxygen Device  None  None  None     Sleep Oxygen Prescription  None  None  None     Home Exercise Oxygen Prescription  None  None  None     Home at Rest Exercise Oxygen Prescription  None  None  None       Goals/Expected Outcomes   Short Term Goals  To learn and demonstrate proper use of respiratory medications;To learn and understand importance of maintaining oxygen saturations>88%;To learn and demonstrate proper pursed lip breathing techniques or other breathing techniques.;To learn and understand importance of monitoring SPO2 with pulse oximeter and demonstrate accurate use of the pulse oximeter.  To learn and demonstrate proper use of respiratory medications;To learn and understand importance of maintaining oxygen saturations>88%;To learn and demonstrate proper pursed lip breathing techniques or other breathing techniques.;To learn and understand importance of monitoring SPO2 with pulse oximeter and demonstrate accurate use of the pulse oximeter.  To learn and demonstrate proper use of respiratory medications;To learn and understand importance of maintaining oxygen saturations>88%;To learn and demonstrate proper pursed lip breathing techniques or other breathing techniques.;To learn and understand importance of monitoring SPO2 with pulse oximeter and demonstrate accurate use of the pulse oximeter.     Long  Term Goals  Verbalizes importance of monitoring SPO2 with pulse oximeter and return demonstration;Maintenance of O2 saturations>88%;Exhibits proper breathing techniques, such as pursed lip breathing or other method taught during program session;Compliance with  respiratory medication;Demonstrates proper use of MDI's  Verbalizes importance of monitoring SPO2 with pulse oximeter and return demonstration;Maintenance of O2 saturations>88%;Exhibits proper breathing techniques, such as pursed lip breathing or other method taught during program session;Compliance with respiratory medication;Demonstrates proper use of MDI's  Verbalizes importance of monitoring SPO2 with pulse oximeter and return demonstration;Maintenance of O2 saturations>88%;Exhibits proper breathing techniques, such as pursed lip breathing or other method taught during program session;Compliance with respiratory medication;Demonstrates proper use of MDI's     Comments  Reviewed PLB technique with pt.  Talked about how it work and it's important to maintaining his exercise saturations.    Euel does not have to use his rescue inhaler when exercising. He does not check his oxygen at home due to not having a pulse oximeter. Informed him where to get one and why it is important to check his oxygen at home.  Jim's oxygen has been doing well in the program. He has  not fallen below 90 percent when exercisisng. Informed him to check his oxygen at home or when he is working.      Goals/Expected Outcomes  Short: Become more profiecient at using PLB.   Long: Become independent at using PLB.  Short: obtain a pulse oximeter. Long: check oxygen at home independently with pulse oximeter.  Short: monitor oxygen at home with exertion. Long: maintain oxygen saturations above 88 percent independently.        Oxygen Discharge (Final Oxygen Re-Evaluation): Oxygen Re-Evaluation - 07/17/17 1622      Program Oxygen Prescription   Program Oxygen Prescription  None      Home Oxygen   Home Oxygen Device  None    Sleep Oxygen Prescription  None    Home Exercise Oxygen Prescription  None    Home at Rest Exercise Oxygen Prescription  None      Goals/Expected Outcomes   Short Term Goals  To learn and demonstrate proper use  of respiratory medications;To learn and understand importance of maintaining oxygen saturations>88%;To learn and demonstrate proper pursed lip breathing techniques or other breathing techniques.;To learn and understand importance of monitoring SPO2 with pulse oximeter and demonstrate accurate use of the pulse oximeter.    Long  Term Goals  Verbalizes importance of monitoring SPO2 with pulse oximeter and return demonstration;Maintenance of O2 saturations>88%;Exhibits proper breathing techniques, such as pursed lip breathing or other method taught during program session;Compliance with respiratory medication;Demonstrates proper use of MDI's    Comments  Jim's oxygen has been doing well in the program. He has not fallen below 90 percent when exercisisng. Informed him to check his oxygen at home or when he is working.     Goals/Expected Outcomes  Short: monitor oxygen at home with exertion. Long: maintain oxygen saturations above 88 percent independently.       Initial Exercise Prescription: Initial Exercise Prescription - 04/17/17 1600      Date of Initial Exercise RX and Referring Provider   Date  04/17/17    Referring Provider  Merton Border MD      Treadmill   MPH  1.4    Grade  0.5    Minutes  15    METs  2.17      Recumbant Elliptical   Level  1    RPM  50    Minutes  15    METs  2      T5 Nustep   Level  1    SPM  80    Minutes  15    METs  2      Prescription Details   Frequency (times per week)  3    Duration  Progress to 45 minutes of aerobic exercise without signs/symptoms of physical distress      Intensity   THRR 40-80% of Max Heartrate  98-128    Ratings of Perceived Exertion  11-13    Perceived Dyspnea  0-4      Progression   Progression  Continue to progress workloads to maintain intensity without signs/symptoms of physical distress.      Resistance Training   Training Prescription  Yes    Weight  3 lbs    Reps  10-15       Perform Capillary Blood  Glucose checks as needed.  Exercise Prescription Changes: Exercise Prescription Changes    Row Name 04/17/17 1600 04/26/17 1100 04/27/17 1000 05/10/17 1500 05/24/17 1200     Response to Exercise   Blood Pressure (  Admit)  122/64  -  116/60  110/62  116/56   Blood Pressure (Exercise)  126/64  -  -  -  -   Blood Pressure (Exit)  122/60  -  106/64  118/70  124/64   Heart Rate (Admit)  68 bpm  -  67 bpm  87 bpm  76 bpm   Heart Rate (Exercise)  78 bpm  -  90 bpm  85 bpm  84 bpm   Heart Rate (Exit)  74 bpm  -  67 bpm  66 bpm  70 bpm   Oxygen Saturation (Admit)  94 %  -  95 %  94 %  93 %   Oxygen Saturation (Exercise)  90 %  -  93 %  94 %  92 %   Oxygen Saturation (Exit)  94 %  -  93 %  96 %  94 %   Rating of Perceived Exertion (Exercise)  9  -  11  12  11    Perceived Dyspnea (Exercise)  1  -  1  1  3    Symptoms  none  -  none  none  none   Comments  walk test results  -  -  -  -   Duration  -  -  Continue with 45 min of aerobic exercise without signs/symptoms of physical distress.  Continue with 45 min of aerobic exercise without signs/symptoms of physical distress.  Continue with 45 min of aerobic exercise without signs/symptoms of physical distress.   Intensity  -  -  THRR unchanged  THRR unchanged  THRR unchanged     Progression   Progression  -  -  Continue to progress workloads to maintain intensity without signs/symptoms of physical distress.  Continue to progress workloads to maintain intensity without signs/symptoms of physical distress.  Continue to progress workloads to maintain intensity without signs/symptoms of physical distress.   Average METs  -  -  1.95  2.4  2.9     Resistance Training   Training Prescription  -  -  Yes  Yes  Yes   Weight  -  -  3 lb  3 lb  6 lb   Reps  -  -  10-15  10-15  10-15     Interval Training   Interval Training  -  -  -  No  No     Treadmill   MPH  -  -  -  1.4  1.7   Grade  -  -  -  2.5  3   Minutes  -  -  -  15  15   METs  -  -  -  2.5  3      Recumbant Elliptical   Level  -  -  2  3  -   RPM  -  -  48  -  -   Minutes  -  -  15  15  -   METs  -  -  2.1  2.3  -     T5 Nustep   Level  -  -  2  3  5    SPM  -  -  80  88  73   Minutes  -  -  15  15  15    METs  -  -  1.8  2.2  2.8     Home Exercise Plan   Plans to continue exercise at  -  [employer] (comment) [employer] at [employer], [employer] (comment) [employer], [employer] (comment) [employer] at [employer], [employer] (comment) [employer]   Frequency  -  Add 1 additional day to program exercise sessions.  Add 1 additional day to program exercise sessions.  Add 1 additional day to program exercise sessions.  Add 1 additional day to program exercise sessions.   Initial Home Exercises Provided  -  04/26/17  04/26/17  04/26/17  04/26/17   Row Name 06/07/17 1200 06/21/17 1500 07/05/17 1000 07/19/17 1100 07/19/17 1200     Response to Exercise   Blood Pressure (Admit)  132/64  138/70  123/64  124/62  -   Blood Pressure (Exit)  118/64  128/60  118/60  106/66  -   Heart Rate (Admit)  80 bpm  72 bpm  69 bpm  73 bpm  -   Heart Rate (Exercise)  88 bpm  84 bpm  90 bpm  85 bpm  -   Heart Rate (Exit)  66 bpm  67 bpm  68 bpm  73 bpm  -   Oxygen Saturation (Admit)  94 %  95 %  93 %  94 %  -   Oxygen Saturation (Exercise)  91 %  91 %  92 %  93 %  -   Oxygen Saturation (Exit)  95 %  97 %  94 %  95 %  -   Rating of Perceived Exertion (Exercise)  13  12  12  12   -   Perceived Dyspnea (Exercise)  2  2  2  2   -   Symptoms  none  none  none  none  -   Duration  Continue with 45 min of aerobic exercise without signs/symptoms of physical distress.  Continue with 45 min of aerobic exercise without signs/symptoms of physical distress.  Continue with 45 min of aerobic exercise without signs/symptoms of physical distress.  Continue with 45 min of aerobic exercise without signs/symptoms of physical distress.   -   Intensity  THRR unchanged  Other (comment) progressing workloads - not reaching THR  THRR unchanged  THRR unchanged  -     Progression   Progression  Continue to progress workloads to maintain intensity without signs/symptoms of physical distress.  Continue to progress workloads to maintain intensity without signs/symptoms of physical distress.  Continue to progress workloads to maintain intensity without signs/symptoms of physical distress.  Continue to progress workloads to maintain intensity without signs/symptoms of physical distress.  -   Average METs  2.8  2.4  2.61  2.63  -     Resistance Training   Training Prescription  Yes  Yes  Yes  Yes  -   Weight  6 lb  5 lb  5 lb  4 lb  -   Reps  10-15  10-15  10-15  10-15  -     Interval Training   Interval Training  No  No  No  No  -     Treadmill   MPH  1.3  1.5  2  2   -   Grade  7  2  2.5  2.5  -   Minutes  15  15  15  15   -   METs  3  2.56  3.22  3.22  -     Recumbant Elliptical   Level  4  -  3  4  -   RPM  48  -  51  50  -   Minutes  15  -  15  15  -   METs  2.9  -  2.4  1.8  -     T5 Nustep   Level  4  4  3   -  -   SPM  75  77  99  -  -   Minutes  15  15  15   -  -   METs  2.4  2.3  2.2  -  -     Home Exercise Plan   Plans to continue exercise at  [employer] (comment) [employer] at [employer] [place] (comment) [employer] at [employer], [employer] (comment) [employer]  -  [employer] (comment) [employer]   Frequency  Add 1 additional day to program exercise sessions.  Add 1 additional day to program exercise sessions.  Add 2 additional days to program exercise sessions.  -  Add 2 additional days to program exercise sessions.   Initial Home Exercises Provided  04/26/17  04/26/17  04/26/17  -  04/26/17   Row Name 08/02/17 1300 08/18/17 0800           Response to Exercise   Blood Pressure (Admit)  126/62  130/70      Blood  Pressure (Exit)  128/60  126/68      Heart Rate (Admit)  73 bpm  72 bpm      Heart Rate (Exercise)  85 bpm  77 bpm      Heart Rate (Exit)  68 bpm  62 bpm      Oxygen Saturation (Admit)  93 %  92 %      Oxygen Saturation (Exercise)  94 %  90 %      Oxygen Saturation (Exit)  94 %  94 %      Rating of Perceived Exertion (Exercise)  13  11      Perceived Dyspnea (Exercise)  2  2      Symptoms  none  none      Duration  Continue with 45 min of aerobic exercise without signs/symptoms of physical distress.  Continue with 45 min of aerobic exercise without signs/symptoms of physical distress.      Intensity  THRR unchanged  THRR unchanged        Progression   Progression  Continue to progress workloads to maintain intensity without signs/symptoms of physical distress.  Continue to progress workloads to maintain intensity without signs/symptoms of physical distress.      Average METs  2.75  2.7        Resistance Training   Training Prescription  Yes  Yes      Weight  4 lb  4 lb      Reps  10-15  10-15        Interval Training   Interval Training  No  No        Treadmill   MPH  2  2      Grade  2.5  2.5      Minutes  15  15      METs  3.22  3.22        Recumbant Elliptical   Level  4  4      RPM  50  50      Minutes  15  15      METs  2.2  2.2        T5 Nustep   Level  4  4      Minutes  15  15      METs  2.3  2.2        Home Exercise Plan   Plans to continue exercise at  [facility] (comment) [employer] at [employer] [place] (comment) [employer]      Frequency  Add 2 additional days to program exercise sessions.  Add 2 additional days to program exercise sessions.      Initial Home Exercises Provided  04/26/17  04/26/17         Exercise Comments: Exercise Comments    Row Name 04/19/17 1009 04/26/17 1125         Exercise Comments  First full day of exercise!  Patient was oriented to gym and equipment including functions,  settings, policies, and procedures.  Patient's individual exercise prescription and treatment plan were reviewed.  All starting workloads were established based on the results of the 6 minute walk test done at initial orientation visit.  The plan for exercise progression was also introduced and progression will be customized based on patient's performance and goals.  .Reviewed home exercise with pt today.  Pt plans to go to [facility] for exercise.  Reviewed THR, pulse, RPE, sign and symptoms, NTG use, and when to call 911 or MD.  Also discussed weather considerations and indoor options.  Pt voiced understanding.         Exercise Goals and Review: Exercise Goals    Row Name 04/17/17 1625             Exercise Goals   Increase Physical Activity  Yes       Intervention  Develop an individualized exercise prescription for aerobic and resistive training based on initial evaluation findings, risk stratification, comorbidities and participant's personal goals.;Provide advice, education, support and counseling about physical activity/exercise needs.       Expected Outcomes  Short Term: Attend rehab on a regular basis to increase amount of physical activity.;Long Term: Add in home exercise to make exercise part of routine and to increase amount of physical activity.;Long Term: Exercising regularly at least 3-5 days a week.       Increase Strength and Stamina  Yes       Intervention  Provide advice, education, support and counseling about physical activity/exercise needs.;Develop an individualized exercise prescription for aerobic and resistive training based on initial evaluation findings, risk stratification, comorbidities and participant's personal goals.       Expected Outcomes  Short Term: Increase workloads from initial exercise prescription for resistance, speed, and METs.;Short Term: Perform resistance training exercises routinely during rehab and add in resistance training at home;Long Term:  Improve cardiorespiratory fitness, muscular endurance and strength as measured by increased METs and functional capacity (6MWT)       Able to understand and use rate of perceived exertion (RPE) scale  Yes       Intervention  Provide education and explanation on how to use RPE scale       Expected Outcomes  Short Term: Able to use RPE daily in rehab to express subjective intensity level;Long Term:  Able to use RPE to guide intensity level when exercising independently       Able to understand and use Dyspnea scale  Yes  Intervention  Provide education and explanation on how to use Dyspnea scale       Expected Outcomes  Short Term: Able to use Dyspnea scale daily in rehab to express subjective sense of shortness of breath during exertion;Long Term: Able to use Dyspnea scale to guide intensity level when exercising independently       Knowledge and understanding of Target Heart Rate Range (THRR)  Yes       Intervention  Provide education and explanation of THRR including how the numbers were predicted and where they are located for reference       Expected Outcomes  Short Term: Able to state/look up THRR;Short Term: Able to use daily as guideline for intensity in rehab;Long Term: Able to use THRR to govern intensity when exercising independently       Able to check pulse independently  Yes       Intervention  Provide education and demonstration on how to check pulse in carotid and radial arteries.;Review the importance of being able to check your own pulse for safety during independent exercise       Expected Outcomes  Long Term: Able to check pulse independently and accurately;Short Term: Able to explain why pulse checking is important during independent exercise       Understanding of Exercise Prescription  Yes       Intervention  Provide education, explanation, and written materials on patient's individual exercise prescription       Expected Outcomes  Short Term: Able to explain program exercise  prescription;Long Term: Able to explain home exercise prescription to exercise independently          Exercise Goals Re-Evaluation : Exercise Goals Re-Evaluation    Row Name 04/19/17 1009 04/27/17 1027 05/10/17 1539 05/24/17 1218 06/07/17 1233     Exercise Goal Re-Evaluation   Exercise Goals Review  Understanding of Exercise Prescription;Able to understand and use Dyspnea scale;Knowledge and understanding of Target Heart Rate Range (THRR);Able to understand and use rate of perceived exertion (RPE) scale  Increase Physical Activity;Able to understand and use rate of perceived exertion (RPE) scale;Able to understand and use Dyspnea scale;Increase Strength and Stamina  Increase Physical Activity;Able to understand and use rate of perceived exertion (RPE) scale;Knowledge and understanding of Target Heart Rate Range (THRR);Increase Strength and Stamina;Able to understand and use Dyspnea scale  Increase Physical Activity;Able to understand and use rate of perceived exertion (RPE) scale;Increase Strength and Stamina;Able to understand and use Dyspnea scale  Increase Physical Activity;Able to understand and use rate of perceived exertion (RPE) scale;Increase Strength and Stamina;Able to understand and use Dyspnea scale   Comments  Reviewed RPE scale, THR and program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  Review of Cholesterol, Blood Pressure, Diabetes, and BMI and the role they play in your overall health.   Sleep Hygiene: -Provides group verbal and written instruction about how sleep can affect your health.  Define sleep hygiene,  discuss sleep cycles and impact of sleep habits. Review good sleep hygiene tips.    Pulmonary Rehab from 07/24/2017 in [facility] Cardiac and Pulmonary Rehab  Date  06/28/17  Educator  [facility]  Instruction Review Code  1- Verbalizes Understanding      Other: -Provides group and verbal instruction on various topics (see comments)    Knowledge Questionnaire Score: Knowledge Questionnaire Score - 04/17/17 1505      Knowledge Questionnaire Score   Pre Score  13/18   reviewed with patient       Core Components/Risk Factors/Patient Goals at Admission: Personal Goals and Risk Factors at Admission - 04/17/17 1506      Core Components/Risk Factors/Patient Goals on Admission    Weight Management  Yes;Weight Maintenance;Weight Loss    Intervention  Weight Management: Develop a combined nutrition and exercise program designed to reach desired caloric intake, while maintaining appropriate intake of nutrient and fiber, sodium and fats, and appropriate energy expenditure required for the weight goal.;Weight Management: Provide education and appropriate resources to help participant work on and attain dietary goals.;Weight Management/Obesity: Establish reasonable short term and long term weight goals.    Admit Weight  193 lb 8 oz (87.8 kg)    Goal Weight: Short Term  188 lb (85.3 kg)    Goal Weight: Long Term  180 lb (81.6 kg)    Expected Outcomes  Short Term: Continue to assess and modify interventions until short term weight is achieved;Long Term: Adherence to nutrition and physical activity/exercise program aimed toward attainment of established weight goal;Weight Maintenance: Understanding of the daily nutrition guidelines, which includes 25-35% calories from fat, 7% or less cal from saturated fats, less than 278m cholesterol, less than 1.5gm of sodium, & 5 or more servings of fruits and vegetables daily;Understanding recommendations for meals to include 15-35% energy as protein, 25-35% energy from fat,  35-60% energy from carbohydrates, less than 2040mof dietary cholesterol, 20-35 gm of total fiber daily;Weight Loss: Understanding of general recommendations for a balanced deficit meal plan, which promotes 1-2 lb weight loss per week and includes a negative energy balance of [phone] kcal/d;Understanding of distribution of calorie intake throughout the day with the consumption of 4-5 meals/snacks    Improve shortness of breath with ADL's  Yes    Intervention  Provide education, individualized exercise plan and daily activity instruction to help decrease symptoms of SOB with activities of daily living.    Expected Outcomes  Short Term: Improve cardiorespiratory fitness to achieve a reduction of symptoms when performing ADLs;Long Term: Be able to perform more ADLs without symptoms or delay the onset of symptoms       Core Components/Risk Factors/Patient Goals Review:  Goals and Risk Factor Review    Row Name 05/26/17 1039 06/21/17 1024 07/17/17 1620         Core Components/Risk Factors/Patient Goals Review   Personal Goals Review  Weight Management/Obesity;Improve shortness of breath with ADL's;Develop more efficient breathing techniques such as purse lipped breathing and diaphragmatic breathing and practicing self-pacing with activity.;Stress;Increase knowledge of respiratory medications and ability to use respiratory devices properly.  Weight Management/Obesity;Improve shortness of breath with ADL's;Other;Hypertension;Develop more efficient breathing techniques such as purse lipped breathing and diaphragmatic breathing and practicing self-pacing with activity.  Weight  Management/Obesity;Improve shortness of breath with ADL's;Hypertension     Review  Pt reports less SOB since starting exercise.  He practices PLB.  His only stress is worry about daughters health - who is having a baby in July.  He saw PCP this morning and he added metamucil .  Pt is taking all meds as directed.  Wesley Preston states he is much  more active in the summer - he has not had to use rescue inhaler while doing yard work since beginning exercise.  Wesley Preston feels that his shortness of breath has improved since he started the program. He wants to finish the program and feels that he is not sick as some of the other patients and questions himself being there. Informed him that it is still good to exercise and work on his breathing techniques.     Expected Outcomes  Short - Pt will continue to exercise and take meds as directed Long - Pt will continue healthy habits long term   Short - Wesley Preston will take meds as directed and continue to exercise Long - Wesley Preston will maintain fitness on his own - plans to attend [employer] when he graduates  Short: Attend [facility] regularly to improve shortness of breath with ADL's. Long: maintain independence with ADL's         Core Components/Risk Factors/Patient Goals at Discharge (Final Review):  Goals and Risk Factor Review - 07/17/17 1620      Core Components/Risk Factors/Patient Goals Review   Personal Goals Review  Weight Management/Obesity;Improve shortness of breath with ADL's;Hypertension    Review  Wesley Preston feels that his shortness of breath has improved since he started the program. He wants to finish the program and feels that he is not sick as some of the other patients and questions himself being there. Informed him that it is still good to exercise and work on his breathing techniques.    Expected Outcomes  Short: Attend [facility] regularly to improve shortness of breath with ADL's. Long: maintain independence with ADL's        ITP Comments: ITP Comments    Row Name 04/17/17 1441 05/08/17 0821 06/05/17 0817 07/03/17 0942 07/31/17 0839   ITP Comments  Medical Evaluation completed. Chart sent for review and changes to Dr. Emily Filbert Director of [place]. Diagnosis can be found in [facility] encounter 11/20/14   30 day review completed. ITP sent to Dr. Emily Filbert Director of [place].  Continue with ITP unless changes are made by physician   30 day review completed. ITP sent to Dr. Emily Filbert Director of [place]. Continue with ITP unless changes are made by physician  30 day review completed. ITP sent to Dr. Emily Filbert Director of [place]. Continue with ITP unless changes are made by physician  30 day review completed. ITP sent to Dr. Emily Filbert Director of [place]. Continue with ITP unless changes are made by physician   Row Name 08/25/17 1039 08/28/17 0819 08/28/17 0949       ITP Comments  Patient states he has been missing [facility] due to a new grandchild. He has been out of town and plans to return Friday.  30 day review completed. ITP sent to Dr. Emily Filbert Director of [place]. Continue with ITP unless changes are made by physician  Patient called to say he has too much going on with his beach house and is not able to make it to [place] often. Patient states he would like to be discharged.  Comments: Discharge ITP

## 2017-08-28 NOTE — Telephone Encounter (Signed)
Patient called to say he has too much going on with his beach house and is not able to make it to [place] often. Patient states he would like to be discharged.

## 2017-09-20 DIAGNOSIS — E538 Deficiency of other specified B group vitamins: Secondary | ICD-10-CM | POA: Diagnosis not present

## 2017-10-24 DIAGNOSIS — Z23 Encounter for immunization: Secondary | ICD-10-CM | POA: Diagnosis not present

## 2017-10-24 DIAGNOSIS — E538 Deficiency of other specified B group vitamins: Secondary | ICD-10-CM | POA: Diagnosis not present

## 2017-10-24 DIAGNOSIS — M65312 Trigger thumb, left thumb: Secondary | ICD-10-CM | POA: Diagnosis not present

## 2017-11-01 DIAGNOSIS — H6122 Impacted cerumen, left ear: Secondary | ICD-10-CM | POA: Diagnosis not present

## 2017-11-01 DIAGNOSIS — J320 Chronic maxillary sinusitis: Secondary | ICD-10-CM | POA: Diagnosis not present

## 2017-11-01 DIAGNOSIS — J331 Polypoid sinus degeneration: Secondary | ICD-10-CM | POA: Diagnosis not present

## 2017-11-16 DIAGNOSIS — Z872 Personal history of diseases of the skin and subcutaneous tissue: Secondary | ICD-10-CM | POA: Diagnosis not present

## 2017-11-16 DIAGNOSIS — Z85828 Personal history of other malignant neoplasm of skin: Secondary | ICD-10-CM | POA: Diagnosis not present

## 2017-11-16 DIAGNOSIS — L578 Other skin changes due to chronic exposure to nonionizing radiation: Secondary | ICD-10-CM | POA: Diagnosis not present

## 2017-11-16 DIAGNOSIS — D239 Other benign neoplasm of skin, unspecified: Secondary | ICD-10-CM | POA: Diagnosis not present

## 2017-11-16 DIAGNOSIS — L249 Irritant contact dermatitis, unspecified cause: Secondary | ICD-10-CM | POA: Diagnosis not present

## 2017-11-16 DIAGNOSIS — Z1283 Encounter for screening for malignant neoplasm of skin: Secondary | ICD-10-CM | POA: Diagnosis not present

## 2017-11-20 DIAGNOSIS — E538 Deficiency of other specified B group vitamins: Secondary | ICD-10-CM | POA: Diagnosis not present

## 2017-11-20 DIAGNOSIS — E7800 Pure hypercholesterolemia, unspecified: Secondary | ICD-10-CM | POA: Diagnosis not present

## 2017-11-20 DIAGNOSIS — Z125 Encounter for screening for malignant neoplasm of prostate: Secondary | ICD-10-CM | POA: Diagnosis not present

## 2017-11-20 DIAGNOSIS — I10 Essential (primary) hypertension: Secondary | ICD-10-CM | POA: Diagnosis not present

## 2017-11-20 DIAGNOSIS — Z79899 Other long term (current) drug therapy: Secondary | ICD-10-CM | POA: Diagnosis not present

## 2017-11-27 ENCOUNTER — Ambulatory Visit: Payer: Medicare HMO | Admitting: Pulmonary Disease

## 2017-11-27 ENCOUNTER — Encounter: Payer: Self-pay | Admitting: Pulmonary Disease

## 2017-11-27 VITALS — BP 122/64 | HR 73 | Ht 70.0 in | Wt 193.2 lb

## 2017-11-27 DIAGNOSIS — E7800 Pure hypercholesterolemia, unspecified: Secondary | ICD-10-CM | POA: Diagnosis not present

## 2017-11-27 DIAGNOSIS — I10 Essential (primary) hypertension: Secondary | ICD-10-CM | POA: Diagnosis not present

## 2017-11-27 DIAGNOSIS — J449 Chronic obstructive pulmonary disease, unspecified: Secondary | ICD-10-CM

## 2017-11-27 DIAGNOSIS — F3341 Major depressive disorder, recurrent, in partial remission: Secondary | ICD-10-CM | POA: Diagnosis not present

## 2017-11-27 DIAGNOSIS — J986 Disorders of diaphragm: Secondary | ICD-10-CM

## 2017-11-27 DIAGNOSIS — R942 Abnormal results of pulmonary function studies: Secondary | ICD-10-CM | POA: Diagnosis not present

## 2017-11-27 DIAGNOSIS — Z0000 Encounter for general adult medical examination without abnormal findings: Secondary | ICD-10-CM | POA: Diagnosis not present

## 2017-11-27 DIAGNOSIS — G250 Essential tremor: Secondary | ICD-10-CM | POA: Diagnosis not present

## 2017-11-27 DIAGNOSIS — R0609 Other forms of dyspnea: Secondary | ICD-10-CM | POA: Diagnosis not present

## 2017-11-27 DIAGNOSIS — Z79899 Other long term (current) drug therapy: Secondary | ICD-10-CM | POA: Diagnosis not present

## 2017-11-27 DIAGNOSIS — R1013 Epigastric pain: Secondary | ICD-10-CM | POA: Diagnosis not present

## 2017-11-27 DIAGNOSIS — E538 Deficiency of other specified B group vitamins: Secondary | ICD-10-CM | POA: Diagnosis not present

## 2017-11-27 NOTE — Patient Instructions (Signed)
Continue Anoro inhaler Continue albuterol as needed I support your plan to enroll in [employer] program  Follow-up with [place] as needed for any breathing, lung or chest problems

## 2017-11-27 NOTE — Progress Notes (Signed)
PULMONARY OFFICE FOLLOW UP NOTE  Initial consultation: 10/29/14 Reason for consultation: Dyspnea Primary MD: Doy Hutching  Pt Profile:  77 y.o. 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  No major pulmonary events  SUBJ:  This is a scheduled follow-up.  He has no new complaints.  He was participating in pulmonary rehab at [facility] but had to quit due to scheduling problems.  However, he plans to enroll in [employer] first of the year.  He is using Anoro daily.  He never finds a need for his rescue inhaler.  He denies CP, fever, purulent sputum, hemoptysis, LE edema and calf tenderness.  OBJ: Vitals:   11/27/17 1125 11/27/17 1144  BP:  122/64  Pulse:  73  SpO2:  93%  Weight: 193 lb 3.2 oz (87.6 kg)   Height: 5\' 10"  (1.778 m)   RA  Gen: NAD HEENT: NCAT, sclera white Neck: No JVD Lungs: breath sounds full, no wheezes or other adventitious sounds Cardiovascular: RRR, no murmurs Abdomen: Soft, nontender, normal BS Ext: without clubbing, cyanosis, edema Neuro: grossly intact Skin: Limited exam, no lesions noted    DATA: No flowsheet data found.   No flowsheet data found.   CXR: No new film  IMPRESSION:   ICD-10-CM   1. COPD, mild ([place]) J44.9   2. Mild restrictive pattern present on pulmonary function testing due to paralyzed L hemidiaphragm R94.2    3. 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 Mild-moderate DOE R06.09       PLAN: Continue Anoro inhaler daily (he believes this is beneficial) Continue albuterol inhaler as needed I offered encouragement for him to pursue his plan of enrolling in [place] [employer], he can follow-up with his primary care physician (Dr. Doy Hutching).  He is to follow-up with [place] as needed for any breathing, chest or respiratory problems.   Merton Border, MD PCCM service Mobile [phone] Pager [phone] 11/28/2017 12:28 PM

## 2017-12-18 DIAGNOSIS — H52223 Regular astigmatism, bilateral: Secondary | ICD-10-CM | POA: Diagnosis not present

## 2017-12-25 DIAGNOSIS — Z86010 Personal history of colonic polyps: Secondary | ICD-10-CM | POA: Diagnosis not present

## 2017-12-25 DIAGNOSIS — K5909 Other constipation: Secondary | ICD-10-CM | POA: Diagnosis not present

## 2017-12-25 DIAGNOSIS — K219 Gastro-esophageal reflux disease without esophagitis: Secondary | ICD-10-CM | POA: Diagnosis not present

## 2018-03-09 ENCOUNTER — Ambulatory Visit: Payer: Medicare HMO | Admitting: Anesthesiology

## 2018-03-09 ENCOUNTER — Other Ambulatory Visit: Payer: Self-pay

## 2018-03-09 ENCOUNTER — Encounter
Admission: RE | Disposition: A | Discharge status: Home / Self Care | Payer: Self-pay | Source: Home / Self Care | Attending: Unknown Physician Specialty

## 2018-03-09 ENCOUNTER — Ambulatory Visit
Admission: RE | Admit: 2018-03-09 | Discharge: 2018-03-09 | Disposition: A | Discharge status: Home / Self Care | Payer: Medicare HMO | Attending: Unknown Physician Specialty | Admitting: Unknown Physician Specialty

## 2018-03-09 DIAGNOSIS — Z86010 Personal history of colonic polyps: Secondary | ICD-10-CM | POA: Insufficient documentation

## 2018-03-09 DIAGNOSIS — K269 Duodenal ulcer, unspecified as acute or chronic, without hemorrhage or perforation: Secondary | ICD-10-CM | POA: Diagnosis not present

## 2018-03-09 DIAGNOSIS — Z09 Encounter for follow-up examination after completed treatment for conditions other than malignant neoplasm: Secondary | ICD-10-CM | POA: Insufficient documentation

## 2018-03-09 DIAGNOSIS — J392 Other diseases of pharynx: Secondary | ICD-10-CM | POA: Insufficient documentation

## 2018-03-09 DIAGNOSIS — K2980 Duodenitis without bleeding: Secondary | ICD-10-CM | POA: Diagnosis not present

## 2018-03-09 DIAGNOSIS — K222 Esophageal obstruction: Secondary | ICD-10-CM | POA: Diagnosis not present

## 2018-03-09 DIAGNOSIS — N400 Enlarged prostate without lower urinary tract symptoms: Secondary | ICD-10-CM | POA: Insufficient documentation

## 2018-03-09 DIAGNOSIS — Z85828 Personal history of other malignant neoplasm of skin: Secondary | ICD-10-CM | POA: Diagnosis not present

## 2018-03-09 DIAGNOSIS — K648 Other hemorrhoids: Secondary | ICD-10-CM | POA: Diagnosis not present

## 2018-03-09 DIAGNOSIS — Z79899 Other long term (current) drug therapy: Secondary | ICD-10-CM | POA: Insufficient documentation

## 2018-03-09 DIAGNOSIS — Z8719 Personal history of other diseases of the digestive system: Secondary | ICD-10-CM | POA: Diagnosis not present

## 2018-03-09 DIAGNOSIS — D122 Benign neoplasm of ascending colon: Secondary | ICD-10-CM | POA: Diagnosis not present

## 2018-03-09 DIAGNOSIS — I10 Essential (primary) hypertension: Secondary | ICD-10-CM | POA: Diagnosis not present

## 2018-03-09 DIAGNOSIS — K2970 Gastritis, unspecified, without bleeding: Secondary | ICD-10-CM | POA: Diagnosis not present

## 2018-03-09 DIAGNOSIS — K5730 Diverticulosis of large intestine without perforation or abscess without bleeding: Secondary | ICD-10-CM | POA: Insufficient documentation

## 2018-03-09 DIAGNOSIS — D120 Benign neoplasm of cecum: Secondary | ICD-10-CM | POA: Diagnosis not present

## 2018-03-09 DIAGNOSIS — Z87891 Personal history of nicotine dependence: Secondary | ICD-10-CM | POA: Diagnosis not present

## 2018-03-09 DIAGNOSIS — J449 Chronic obstructive pulmonary disease, unspecified: Secondary | ICD-10-CM | POA: Diagnosis not present

## 2018-03-09 HISTORY — PX: ESOPHAGOGASTRODUODENOSCOPY: SHX5428

## 2018-03-09 HISTORY — PX: COLONOSCOPY WITH PROPOFOL: SHX5780

## 2018-03-09 SURGERY — EGD (ESOPHAGOGASTRODUODENOSCOPY)
Anesthesia: General

## 2018-03-09 MED ORDER — PROPOFOL 500 MG/50ML IV EMUL
INTRAVENOUS | Status: DC | PRN
  Administered 2018-03-09: 120 ug/kg/min via INTRAVENOUS

## 2018-03-09 MED ORDER — PROPOFOL 500 MG/50ML IV EMUL
INTRAVENOUS | Status: AC
  Filled 2018-03-09: qty 50

## 2018-03-09 MED ORDER — PROPOFOL 10 MG/ML IV BOLUS
INTRAVENOUS | Status: AC
  Filled 2018-03-09: qty 20

## 2018-03-09 MED ORDER — LIDOCAINE 2% (20 MG/ML) 5 ML SYRINGE
INTRAMUSCULAR | Status: DC | PRN
  Administered 2018-03-09: 100 mg via INTRAVENOUS

## 2018-03-09 MED ORDER — SODIUM CHLORIDE 0.9 % IV SOLN
INTRAVENOUS | Status: DC
  Administered 2018-03-09: 1000 mL via INTRAVENOUS

## 2018-03-09 MED ORDER — GLYCOPYRROLATE 0.2 MG/ML IJ SOLN
INTRAMUSCULAR | Status: DC | PRN
  Administered 2018-03-09: 0.2 mg via INTRAVENOUS

## 2018-03-09 MED ORDER — SODIUM CHLORIDE 0.9 % IV SOLN: INTRAVENOUS | Status: DC

## 2018-03-09 MED ORDER — PROPOFOL 10 MG/ML IV BOLUS
INTRAVENOUS | Status: DC | PRN
  Administered 2018-03-09: 65 mg via INTRAVENOUS
  Administered 2018-03-09: 35 mg via INTRAVENOUS

## 2018-03-09 NOTE — Op Note (Signed)
[facility] Gastroenterology Patient Name: Wesley Preston Procedure Date: 03/09/2018 9:51 AM MRN: [id number] Account #: [id number] Date of Birth: [birth year] Admit Type: Outpatient Age: 78 Room: [facility] ENDO ROOM 2 Gender: Male Note Status: Finalized Procedure:            Upper GI endoscopy Indications:          Follow-up of gastro-esophageal reflux disease Providers:            Manya Silvas, MD Referring MD:         Leonie Douglas. Doy Hutching, MD (Referring MD) Medicines:            Propofol per Anesthesia Complications:        No immediate complications. Procedure:            Pre-Anesthesia Assessment:                       - After reviewing the risks and benefits, the patient                        was deemed in satisfactory condition to undergo the                        procedure.                       After obtaining informed consent, the endoscope was                        passed under direct vision. Throughout the procedure,                        the patient's blood pressure, pulse, and oxygen                        saturations were monitored continuously. The Endoscope                        was introduced through the mouth, and advanced to the                        second part of duodenum. The upper GI endoscopy was                        accomplished without difficulty. The patient tolerated                        the procedure well. Findings:      Enlarged round nodule right side hypopharynx.      One benign-appearing, intrinsic moderate stenosis was found 43 cm from       the incisors. This stenosis measured 1 cm (in length). The stenosis was       traversed. A guidewire was placed and the scope was withdrawn. Dilation       was performed with a Savary dilator with mild resistance at 16 mm.      Patchy mild inflammation characterized by erythema and granularity was       found in the gastric body. Biopsies were taken with a cold forceps for        histology.      Few non-bleeding superficial duodenal ulcers with no stigmata of  bleeding were found in the duodenal bulb. Impression:           - Enlarged round nodule right side hypopharynx.                       - Benign-appearing esophageal stenosis. Dilated.                       - Gastritis. Biopsied.                       - Multiple non-bleeding duodenal ulcers with no                        stigmata of bleeding. Recommendation:       - Await pathology results. Manya Silvas, MD 03/09/2018 10:32:22 AM This report has been signed electronically. Number of Addenda: 0 Note Initiated On: 03/09/2018 9:51 AM      [facility]

## 2018-03-09 NOTE — Anesthesia Post-op Follow-up Note (Signed)
Anesthesia QCDR form completed.        

## 2018-03-09 NOTE — H&P (Signed)
Primary Care Physician:  Idelle Crouch, MD Primary Gastroenterologist:  Dr. Vira Agar  Pre-Procedure History & Physical: HPI:  Wesley Preston is a 78 y.o. male is here for an endoscopy and colonoscopy.   Past Medical History:  Diagnosis Date  . Anxiety   . Back pain   . BPH (benign prostatic hyperplasia)   . Cancer (HCC)    BASAL CELL  . Chest pain, non-cardiac   . Colon polyps   . COPD (chronic obstructive pulmonary disease) ([place])   . Depression   . Essential tremor   . Essential tremor   . Family history of adverse reaction to anesthesia    DAUGHTERS GET NAUSEATED  . GERD (gastroesophageal reflux disease)    H/O  . Helicobacter pylori (H. pylori) infection   . Hyperlipidemia   . Hypertension   . Neuromuscular disorder ([place])    TREMORS  . Reactive airway disease   . Reactive airway disease   . Restless leg syndrome     Past Surgical History:  Procedure Laterality Date  . COLONOSCOPY  2013  . COLONOSCOPY WITH PROPOFOL N/A 03/21/2016   Procedure: COLONOSCOPY WITH PROPOFOL;  Surgeon: Manya Silvas, MD;  Location: [facility] ENDOSCOPY;  Service: Endoscopy;  Laterality: N/A;  . ESOPHAGOGASTRODUODENOSCOPY    . INGUINAL HERNIA REPAIR Right 08/01/2014   Procedure: RIGHT INGUINAL HERNIA  REPAIR WITH MESH ;  Surgeon: Mayer Vondrak Bellow, MD;  Location: [facility] ORS;  Service: General;  Laterality: Right; with large Ultra Pro mesh  . NASAL SINUS SURGERY    . ROTATOR CUFF REPAIR    . TRIGGER FINGER RELEASE      Prior to Admission medications   Medication Sig Start Date End Date Taking? Authorizing Provider  VENTOLIN HFA 108 (90 Base) MCG/ACT inhaler INHALE 2 PUFFS INTO THE LUNGS EVERY 6 (SIX) HOURS AS NEEDED FOR WHEEZING OR SHORTNESS OF BREATH. 02/02/16  Yes Wilhelmina Mcardle, MD  doxazosin (CARDURA) 4 MG tablet  04/26/14   [provider]  gabapentin (NEURONTIN) 100 MG capsule TAKE 1 CAPSULE (100 MG TOTAL) BY MOUTH 3 (THREE) TIMES DAILY. 06/29/14   [provider]   NASACORT ALLERGY 24HR 55 MCG/ACT AERO nasal inhaler  12/05/14   [provider]  primidone (MYSOLINE) 50 MG tablet Take 1 tablet by mouth 3 (three) times daily.  02/28/17   [provider]  propranolol ER (INDERAL LA) 60 MG 24 hr capsule Take 60 mg by mouth daily.  05/28/14 05/22/17  [provider]  umeclidinium-vilanterol (ANORO ELLIPTA) 62.5-25 MCG/INH AEPB Inhale 1 puff into the lungs daily. 04/03/17   Wilhelmina Mcardle, MD    Allergies as of 12/29/2017 - Review Complete 11/27/2017  Allergen Reaction Noted  . Shellfish allergy Anaphylaxis 03/21/2016    Family History  Problem Relation Age of Onset  . Heart disease Father     Social History   Socioeconomic History  . Marital status: Married    Spouse name: Not on file  . Number of children: Not on file  . Years of education: Not on file  . Highest education level: Not on file  Occupational History  . Not on file  Social Needs  . Financial resource strain: Not on file  . Food insecurity:    Worry: Not on file    Inability: Not on file  . Transportation needs:    Medical: Not on file    Non-medical: Not on file  Tobacco Use  . Smoking status: Former Smoker  Packs/day: 1.00    Years: 20.00    Pack years: 20.00    Types: Cigarettes    Last attempt to quit: 01/04/1988    Years since quitting: 30.1  . Smokeless tobacco: Never Used  Substance and Sexual Activity  . Alcohol use: Yes    Alcohol/week: 24.0 standard drinks    Types: 24 Standard drinks or equivalent per week    Comment: WINE QHS  . Drug use: No  . Sexual activity: Not on file  Lifestyle  . Physical activity:    Days per week: Not on file    Minutes per session: Not on file  . Stress: Not on file  Relationships  . Social connections:    Talks on phone: Not on file    Gets together: Not on file    Attends religious service: Not on file    Active member of club or organization: Not on file    Attends meetings of clubs or  organizations: Not on file    Relationship status: Not on file  . Intimate partner violence:    Fear of current or ex partner: Not on file    Emotionally abused: Not on file    Physically abused: Not on file    Forced sexual activity: Not on file  Other Topics Concern  . Not on file  Social History Narrative  . Not on file    Review of Systems: See HPI, otherwise negative ROS  Physical Exam: BP 104/75   Pulse 83   Temp (!) 97.2 F (36.2 C) (Tympanic)   Resp 20   Ht 5\' 10"  (1.778 m)   Wt 82.6 kg   SpO2 97%   BMI 26.11 kg/m  General:   Alert,  pleasant and cooperative in NAD Head:  Normocephalic and atraumatic. Neck:  Supple; no masses or thyromegaly. Lungs:  Clear throughout to auscultation.    Heart:  Regular rate and rhythm. Abdomen:  Soft, nontender and nondistended. Normal bowel sounds, without guarding, and without rebound.   Neurologic:  Alert and  oriented x4;  grossly normal neurologically.  Impression/Plan: Wesley Preston is here for an endoscopy and colonoscopy to be performed for Follow up previous polyps.  Risks, benefits, limitations, and alternatives regarding  colonoscopy have been reviewed with the patient.  Questions have been answered.  All parties agreeable.   Gaylyn Cheers, MD  03/09/2018, 11:23 AM

## 2018-03-09 NOTE — Transfer of Care (Signed)
Immediate Anesthesia Transfer of Care Note  Patient: Wesley Preston  Procedure(s) Performed: ESOPHAGOGASTRODUODENOSCOPY (EGD) (N/A ) COLONOSCOPY WITH PROPOFOL (N/A )  Patient Location: PACU and Endoscopy Unit  Anesthesia Type:General  Level of Consciousness: sedated  Airway & Oxygen Therapy: Patient Spontanous Breathing  Post-op Assessment: Post -op Vital signs reviewed and stable  Post vital signs: stable  Last Vitals:  Vitals Value Taken Time  BP 93/63 03/09/2018 11:04 AM  Temp    Pulse 89 03/09/2018 11:08 AM  Resp 18 03/09/2018 11:08 AM  SpO2 96 % 03/09/2018 11:08 AM  Vitals shown include unvalidated device data.  Last Pain:  Vitals:   03/09/18 1103  TempSrc: Tympanic  PainSc: Asleep         Complications: No apparent anesthesia complications

## 2018-03-09 NOTE — Anesthesia Preprocedure Evaluation (Addendum)
Anesthesia Evaluation  Patient identified by MRN, date of birth, ID band Patient awake    Reviewed: Allergy & Precautions, H&P , NPO status , Patient's Chart, lab work & pertinent test results  Airway Mallampati: II  TM Distance: >3 FB     Dental  (+) Lower Dentures   Pulmonary neg shortness of breath, COPD,  COPD inhaler, neg recent URI, former smoker,           Cardiovascular hypertension, (-) angina     Neuro/Psych PSYCHIATRIC DISORDERS Anxiety Depression    GI/Hepatic Neg liver ROS, GERD  Controlled,  Endo/Other  negative endocrine ROS  Renal/GU negative Renal ROS  negative genitourinary   Musculoskeletal   Abdominal   Peds  Hematology negative hematology ROS (+)   Anesthesia Other Findings Past Medical History: No date: Anxiety No date: Back pain No date: BPH (benign prostatic hyperplasia) No date: Cancer (HCC)     Comment:  BASAL CELL No date: Chest pain, non-cardiac No date: Colon polyps No date: COPD (chronic obstructive pulmonary disease) (HCC) No date: Depression No date: Essential tremor No date: Essential tremor No date: Family history of adverse reaction to anesthesia     Comment:  DAUGHTERS GET NAUSEATED No date: GERD (gastroesophageal reflux disease)     Comment:  H/O No date: Helicobacter pylori (H. pylori) infection No date: Hyperlipidemia No date: Hypertension No date: Neuromuscular disorder ([place])     Comment:  TREMORS No date: Reactive airway disease No date: Reactive airway disease No date: Restless leg syndrome  Past Surgical History: 2013: COLONOSCOPY 03/21/2016: COLONOSCOPY WITH PROPOFOL; N/A     Comment:  Procedure: COLONOSCOPY WITH PROPOFOL;  Surgeon: Manya Silvas, MD;  Location: [facility] ENDOSCOPY;  Service:               Endoscopy;  Laterality: N/A; No date: ESOPHAGOGASTRODUODENOSCOPY 08/01/2014: INGUINAL HERNIA REPAIR; Right     Comment:  Procedure: RIGHT  INGUINAL HERNIA  REPAIR WITH MESH ;                Surgeon: Robert Bellow, MD;  Location: [facility] ORS;                Service: General;  Laterality: Right; with large Ultra               Pro mesh No date: NASAL SINUS SURGERY No date: ROTATOR CUFF REPAIR No date: TRIGGER FINGER RELEASE     Reproductive/Obstetrics negative OB ROS                            Anesthesia Physical Anesthesia Plan  ASA: III  Anesthesia Plan: General   Post-op Pain Management:    Induction:   PONV Risk Score and Plan: Propofol infusion and TIVA  Airway Management Planned: Natural Airway and Nasal Cannula  Additional Equipment:   Intra-op Plan:   Post-operative Plan:   Informed Consent: I have reviewed the patients History and Physical, chart, labs and discussed the procedure including the risks, benefits and alternatives for the proposed anesthesia with the patient or authorized representative who has indicated his/her understanding and acceptance.     Dental Advisory Given  Plan Discussed with: Anesthesiologist and CRNA  Anesthesia Plan Comments:         Anesthesia Quick Evaluation

## 2018-03-09 NOTE — Anesthesia Postprocedure Evaluation (Signed)
Anesthesia Post Note  Patient: Wesley Preston  Procedure(s) Performed: ESOPHAGOGASTRODUODENOSCOPY (EGD) (N/A ) COLONOSCOPY WITH PROPOFOL (N/A )  Patient location during evaluation: PACU Anesthesia Type: General Level of consciousness: awake and alert Pain management: pain level controlled Vital Signs Assessment: post-procedure vital signs reviewed and stable Respiratory status: spontaneous breathing, nonlabored ventilation and respiratory function stable Cardiovascular status: blood pressure returned to baseline and stable Postop Assessment: no apparent nausea or vomiting Anesthetic complications: no     Last Vitals:  Vitals:   03/09/18 1117 03/09/18 1130  BP: 104/75 112/77  Pulse: 83   Resp: 20   Temp:    SpO2: 97%     Last Pain:  Vitals:   03/09/18 1143  TempSrc:   PainSc: 0-No pain                 Durenda Hurt

## 2018-03-09 NOTE — Op Note (Signed)
[facility] Patient Name: Wesley Preston Procedure Date: 03/09/2018 9:50 AM MRN: [id number] Account #: [id number] Date of Birth: April 15, 1940 Admit Type: Outpatient Age: 78 Room: [facility] ENDO ROOM 2 Gender: Male Note Status: Finalized Procedure:            Colonoscopy Indications:          High risk colon cancer surveillance: Personal history                        of colonic polyps, Therapeutic procedure for colon                        polyps Providers:            Manya Silvas, MD Medicines:            Propofol per Anesthesia Complications:        No immediate complications. Procedure:            Pre-Anesthesia Assessment:                       - After reviewing the risks and benefits, the patient                        was deemed in satisfactory condition to undergo the                        procedure.                       After obtaining informed consent, the colonoscope was                        passed under direct vision. Throughout the procedure,                        the patient's blood pressure, pulse, and oxygen                        saturations were monitored continuously. The                        Colonoscope was introduced through the anus and                        advanced to the the cecum, identified by appendiceal                        orifice and ileocecal valve. The colonoscopy was                        performed without difficulty. The patient tolerated the                        procedure well. The quality of the bowel preparation                        was good. Findings:      A small polyp was found in the proximal ascending colon. This was likely       the polyp seen last time.The polyp was sessile. The polyp was removed  with a cold snare. Resection and retrieval were complete. Other small       polyps removed with jumbo forceps.Biopsies were taken with a cold       forceps for histology. Some diverticulosis  seen. Internal hemorrhoids       seen. No other abnormality seen. Impression:           - One small polyp in the proximal ascending colon,                        removed with a cold snare. Resected and retrieved.                        Biopsied. Recommendation:       - Await pathology results. Manya Silvas, MD 03/09/2018 11:04:14 AM This report has been signed electronically. Number of Addenda: 0 Note Initiated On: 03/09/2018 9:50 AM Scope Withdrawal Time: 0 hours 12 minutes 36 seconds  Total Procedure Duration: 0 hours 26 minutes 21 seconds       [facility]

## 2018-03-10 ENCOUNTER — Encounter: Payer: Self-pay | Admitting: Unknown Physician Specialty

## 2018-03-13 LAB — SURGICAL PATHOLOGY

## 2018-06-12 DIAGNOSIS — E7800 Pure hypercholesterolemia, unspecified: Secondary | ICD-10-CM | POA: Diagnosis not present

## 2018-06-12 DIAGNOSIS — Z125 Encounter for screening for malignant neoplasm of prostate: Secondary | ICD-10-CM | POA: Diagnosis not present

## 2018-06-12 DIAGNOSIS — Z79899 Other long term (current) drug therapy: Secondary | ICD-10-CM | POA: Diagnosis not present

## 2018-06-12 DIAGNOSIS — I10 Essential (primary) hypertension: Secondary | ICD-10-CM | POA: Diagnosis not present

## 2018-06-12 DIAGNOSIS — E538 Deficiency of other specified B group vitamins: Secondary | ICD-10-CM | POA: Diagnosis not present

## 2018-06-12 DIAGNOSIS — F419 Anxiety disorder, unspecified: Secondary | ICD-10-CM | POA: Diagnosis not present

## 2018-06-12 DIAGNOSIS — G250 Essential tremor: Secondary | ICD-10-CM | POA: Diagnosis not present

## 2018-06-12 DIAGNOSIS — Z0000 Encounter for general adult medical examination without abnormal findings: Secondary | ICD-10-CM | POA: Diagnosis not present

## 2018-06-12 DIAGNOSIS — N401 Enlarged prostate with lower urinary tract symptoms: Secondary | ICD-10-CM | POA: Diagnosis not present

## 2018-06-29 DIAGNOSIS — E538 Deficiency of other specified B group vitamins: Secondary | ICD-10-CM | POA: Diagnosis not present

## 2018-07-24 ENCOUNTER — Other Ambulatory Visit
Admission: RE | Admit: 2018-07-24 | Discharge: 2018-07-24 | Disposition: A | Discharge status: Home / Self Care | Payer: Medicare HMO | Attending: Urology | Admitting: Urology

## 2018-07-24 ENCOUNTER — Ambulatory Visit (INDEPENDENT_AMBULATORY_CARE_PROVIDER_SITE_OTHER): Payer: Medicare HMO | Admitting: Urology

## 2018-07-24 ENCOUNTER — Other Ambulatory Visit: Payer: Self-pay

## 2018-07-24 ENCOUNTER — Encounter: Payer: Self-pay | Admitting: Urology

## 2018-07-24 VITALS — BP 131/68 | HR 68

## 2018-07-24 DIAGNOSIS — I10 Essential (primary) hypertension: Secondary | ICD-10-CM | POA: Insufficient documentation

## 2018-07-24 DIAGNOSIS — F32A Depression, unspecified: Secondary | ICD-10-CM | POA: Insufficient documentation

## 2018-07-24 DIAGNOSIS — M549 Dorsalgia, unspecified: Secondary | ICD-10-CM | POA: Insufficient documentation

## 2018-07-24 DIAGNOSIS — R351 Nocturia: Secondary | ICD-10-CM

## 2018-07-24 DIAGNOSIS — F419 Anxiety disorder, unspecified: Secondary | ICD-10-CM | POA: Insufficient documentation

## 2018-07-24 DIAGNOSIS — N401 Enlarged prostate with lower urinary tract symptoms: Secondary | ICD-10-CM

## 2018-07-24 DIAGNOSIS — Z8619 Personal history of other infectious and parasitic diseases: Secondary | ICD-10-CM | POA: Insufficient documentation

## 2018-07-24 DIAGNOSIS — E7800 Pure hypercholesterolemia, unspecified: Secondary | ICD-10-CM | POA: Insufficient documentation

## 2018-07-24 DIAGNOSIS — N400 Enlarged prostate without lower urinary tract symptoms: Secondary | ICD-10-CM

## 2018-07-24 DIAGNOSIS — F329 Major depressive disorder, single episode, unspecified: Secondary | ICD-10-CM | POA: Insufficient documentation

## 2018-07-24 DIAGNOSIS — G2581 Restless legs syndrome: Secondary | ICD-10-CM | POA: Insufficient documentation

## 2018-07-24 DIAGNOSIS — G250 Essential tremor: Secondary | ICD-10-CM | POA: Insufficient documentation

## 2018-07-24 DIAGNOSIS — J45909 Unspecified asthma, uncomplicated: Secondary | ICD-10-CM | POA: Insufficient documentation

## 2018-07-24 LAB — URINALYSIS, COMPLETE (UACMP) WITH MICROSCOPIC
Bilirubin Urine: NEGATIVE
Glucose, UA: NEGATIVE mg/dL
Hgb urine dipstick: NEGATIVE
Ketones, ur: NEGATIVE mg/dL
Leukocytes,Ua: NEGATIVE
Nitrite: NEGATIVE
Protein, ur: NEGATIVE mg/dL
RBC / HPF: NONE SEEN RBC/hpf (ref 0–5)
Specific Gravity, Urine: 1.02 (ref 1.005–1.030)
Squamous Epithelial / LPF: NONE SEEN (ref 0–5)
WBC, UA: NONE SEEN WBC/hpf (ref 0–5)
pH: 7.5 (ref 5.0–8.0)

## 2018-07-24 LAB — BLADDER SCAN AMB NON-IMAGING

## 2018-07-24 NOTE — Patient Instructions (Signed)
Cystoscopy Cystoscopy is a procedure that is used to help diagnose and sometimes treat conditions that affect the lower urinary tract. The lower urinary tract includes the bladder and the urethra. The urethra is the tube that drains urine from the bladder. Cystoscopy is done using a thin, tube-shaped instrument with a light and camera at the end (cystoscope). The cystoscope may be hard or flexible, depending on the goal of the procedure. The cystoscope is inserted through the urethra, into the bladder. Cystoscopy may be recommended if you have:  Urinary tract infections that keep coming back.  Blood in the urine (hematuria).  An inability to control when you urinate (urinary incontinence) or an overactive bladder.  Unusual cells found in a urine sample.  A blockage in the urethra, such as a urinary stone.  Painful urination.  An abnormality in the bladder found during an intravenous pyelogram (IVP) or CT scan. Cystoscopy may also be done to remove a sample of tissue to be examined under a microscope (biopsy). Tell a health care provider about:  Any allergies you have.  All medicines you are taking, including vitamins, herbs, eye drops, creams, and over-the-counter medicines.  Any problems you or family members have had with anesthetic medicines.  Any blood disorders you have.  Any surgeries you have had.  Any medical conditions you have.  Whether you are pregnant or may be pregnant. What are the risks? Generally, this is a safe procedure. However, problems may occur, including:  Infection.  Bleeding.  Allergic reactions to medicines.  Damage to other structures or organs. What happens before the procedure?  Ask your health care provider about: ? Changing or stopping your regular medicines. This is especially important if you are taking diabetes medicines or blood thinners. ? Taking medicines such as aspirin and ibuprofen. These medicines can thin your blood. Do not take  these medicines unless your health care provider tells you to take them. ? Taking over-the-counter medicines, vitamins, herbs, and supplements.  Follow instructions from your health care provider about eating or drinking restrictions.  Ask your health care provider what steps will be taken to help prevent infection. These may include: ? Washing skin with a germ-killing soap. ? Taking antibiotic medicine.  You may have an exam or testing, such as: ? X-rays of the bladder, urethra, or kidneys. ? Urine tests to check for signs of infection.  Plan to have someone take you home from the hospital or clinic. What happens during the procedure?   You will be given one or more of the following: ? A medicine to help you relax (sedative). ? A medicine to numb the area (local anesthetic).  The area around the opening of your urethra will be cleaned.  The cystoscope will be passed through your urethra into your bladder.  Germ-free (sterile) fluid will flow through the cystoscope to fill your bladder. The fluid will stretch your bladder so that your health care provider can clearly examine your bladder walls.  Your doctor will look at the urethra and bladder. Your doctor may take a biopsy or remove stones.  The cystoscope will be removed, and your bladder will be emptied. The procedure may vary among health care providers and hospitals. What can I expect after the procedure? After the procedure, it is common to have:  Some soreness or pain in your abdomen and urethra.  Urinary symptoms. These include: ? Mild pain or burning when you urinate. Pain should stop within a few minutes after you urinate. This  may last for up to 1 week. ? A small amount of blood in your urine for several days. ? Feeling like you need to urinate but producing only a small amount of urine. Follow these instructions at home: Medicines  Take over-the-counter and prescription medicines only as told by your health care  provider.  If you were prescribed an antibiotic medicine, take it as told by your health care provider. Do not stop taking the antibiotic even if you start to feel better. General instructions  Return to your normal activities as told by your health care provider. Ask your health care provider what activities are safe for you.  Do not drive for 24 hours if you were given a sedative during your procedure.  Watch for any blood in your urine. If the amount of blood in your urine increases, call your health care provider.  Follow instructions from your health care provider about eating or drinking restrictions.  If a tissue sample was removed for testing (biopsy) during your procedure, it is up to you to get your test results. Ask your health care provider, or the department that is doing the test, when your results will be ready.  Drink enough fluid to keep your urine pale yellow.  Keep all follow-up visits as told by your health care provider. This is important. Contact a health care provider if you:  Have pain that gets worse or does not get better with medicine, especially pain when you urinate.  Have trouble urinating.  Have more blood in your urine. Get help right away if you:  Have blood clots in your urine.  Have abdominal pain.  Have a fever or chills.  Are unable to urinate. Summary  Cystoscopy is a procedure that is used to help diagnose and sometimes treat conditions that affect the lower urinary tract.  Cystoscopy is done using a thin, tube-shaped instrument with a light and camera at the end.  After the procedure, it is common to have some soreness or pain in your abdomen and urethra.  Watch for any blood in your urine. If the amount of blood in your urine increases, call your health care provider.  If you were prescribed an antibiotic medicine, take it as told by your health care provider. Do not stop taking the antibiotic even if you start to feel better. This  information is not intended to replace advice given to you by your health care provider. Make sure you discuss any questions you have with your health care provider. Document Released: 12/18/1999 Document Revised: 12/12/2017 Document Reviewed: 12/12/2017   Benign Prostatic Hyperplasia  Benign prostatic hyperplasia (BPH) is an enlarged prostate gland that is caused by the normal aging process and not by cancer. The prostate is a walnut-sized gland that is involved in the production of semen. It is located in front of the rectum and below the bladder. The bladder stores urine and the urethra is the tube that carries the urine out of the body. The prostate may get bigger as a man gets older. An enlarged prostate can press on the urethra. This can make it harder to pass urine. The build-up of urine in the bladder can cause infection. Back pressure and infection may progress to bladder damage and kidney (renal) failure. What are the causes? This condition is part of a normal aging process. However, not all men develop problems from this condition. If the prostate enlarges away from the urethra, urine flow will not be blocked. If it enlarges  toward the urethra and compresses it, there will be problems passing urine. What increases the risk? This condition is more likely to develop in men over the age of 16 years. What are the signs or symptoms? Symptoms of this condition include:  Getting up often during the night to urinate.  Needing to urinate frequently during the day.  Difficulty starting urine flow.  Decrease in size and strength of your urine stream.  Leaking (dribbling) after urinating.  Inability to pass urine. This needs immediate treatment.  Inability to completely empty your bladder.  Pain when you pass urine. This is more common if there is also an infection.  Urinary tract infection (UTI). How is this diagnosed? This condition is diagnosed based on your medical history, a  physical exam, and your symptoms. Tests will also be done, such as:  A post-void bladder scan. This measures any amount of urine that may remain in your bladder after you finish urinating.  A digital rectal exam. In a rectal exam, your health care provider checks your prostate by putting a lubricated, gloved finger into your rectum to feel the back of your prostate gland. This exam detects the size of your gland and any abnormal lumps or growths.  An exam of your urine (urinalysis).  A prostate specific antigen (PSA) screening. This is a blood test used to screen for prostate cancer.  An ultrasound. This test uses sound waves to electronically produce a picture of your prostate gland. Your health care provider may refer you to a specialist in kidney and prostate diseases (urologist). How is this treated? Once symptoms begin, your health care provider will monitor your condition (active surveillance or watchful waiting). Treatment for this condition will depend on the severity of your condition. Treatment may include:  Observation and yearly exams. This may be the only treatment needed if your condition and symptoms are mild.  Medicines to relieve your symptoms, including: ? Medicines to shrink the prostate. ? Medicines to relax the muscle of the prostate.  Surgery in severe cases. Surgery may include: ? Prostatectomy. In this procedure, the prostate tissue is removed completely through an open incision or with a laparoscope or robotics. ? Transurethral resection of the prostate (TURP). In this procedure, a tool is inserted through the opening at the tip of the penis (urethra). It is used to cut away tissue of the inner core of the prostate. The pieces are removed through the same opening of the penis. This removes the blockage. ? Transurethral incision (TUIP). In this procedure, small cuts are made in the prostate. This lessens the prostate's pressure on the urethra. ? Transurethral  microwave thermotherapy (TUMT). This procedure uses microwaves to create heat. The heat destroys and removes a small amount of prostate tissue. ? Transurethral needle ablation (TUNA). This procedure uses radio frequencies to destroy and remove a small amount of prostate tissue. ? Interstitial laser coagulation (Hamer). This procedure uses a laser to destroy and remove a small amount of prostate tissue. ? Transurethral electrovaporization (TUVP). This procedure uses electrodes to destroy and remove a small amount of prostate tissue. ? Prostatic urethral lift. This procedure inserts an implant to push the lobes of the prostate away from the urethra. Follow these instructions at home:  Take over-the-counter and prescription medicines only as told by your health care provider.  Monitor your symptoms for any changes. Contact your health care provider with any changes.  Avoid drinking large amounts of liquid before going to bed or out in public.  Avoid or reduce how much caffeine or alcohol you drink.  Give yourself time when you urinate.  Keep all follow-up visits as told by your health care provider. This is important. Contact a health care provider if:  You have unexplained back pain.  Your symptoms do not get better with treatment.  You develop side effects from the medicine you are taking.  Your urine becomes very dark or has a bad smell.  Your lower abdomen becomes distended and you have trouble passing your urine. Get help right away if:  You have a fever or chills.  You suddenly cannot urinate.  You feel lightheaded, or very dizzy, or you faint.  There are large amounts of blood or clots in the urine.  Your urinary problems become hard to manage.  You develop moderate to severe low back or flank pain. The flank is the side of your body between the ribs and the hip. These symptoms may represent a serious problem that is an emergency. Do not wait to see if the symptoms will go  away. Get medical help right away. Call your local emergency services (911 in the [place] Do not drive yourself to the hospital. Summary  Benign prostatic hyperplasia (BPH) is an enlarged prostate that is caused by the normal aging process and not by cancer.  An enlarged prostate can press on the urethra. This can make it hard to pass urine.  This condition is part of a normal aging process and is more likely to develop in men over the age of 15 years.  Get help right away if you suddenly cannot urinate. This information is not intended to replace advice given to you by your health care provider. Make sure you discuss any questions you have with your health care provider. Document Released: 12/20/2004 Document Revised: 11/14/2017 Document Reviewed: 01/25/2016 Elsevier Patient Education  2020 [employer] Patient Education  2020 [employer].  Holmium Laser Enucleation of the Prostate (HoLEP)  HoLEP is a treatment for men with benign prostatic hyperplasia (BPH). The laser surgery removed blockages of urine flow, and is done without any incisions on the body.     What is HoLEP?  HoLEP is a type of laser surgery used to treat obstruction (blockage) of urine flow as a result of benign prostatic hyperplasia (BPH). In men with BPH, the prostate gland is not cancerous, but has become enlarged. An enlarged prostate can result in a number of urinary tract symptoms such as weak urinary stream, difficulty in starting urination, inability to urinate, frequent urination, or getting up at night to urinate.  HoLEP was developed in the 1990's as a more effective and less expensive surgical option for BPH, compared to other surgical options such as laser vaporization(PVP/greenlight laser), transurethral resection of the prostate(TURP), and open simple prostatectomy.   What happens during a HoLEP?  HoLEP requires general anesthesia (asleep throughout the procedure).   An antibiotic is  given to reduce the risk of infection  A surgical instrument called a resectoscope is inserted through the urethra (the tube that carries urine from the bladder). The resectoscope has a camera that allows the surgeon to view the internal structure of the prostate gland, and to see where the incisions are being made during surgery.  The laser is inserted into the resectoscope and is used to enucleate (free up) the enlarged prostate tissue from the capsule (outer shell) and then to seal up any blood vessels. The tissue that has been removed is pushed  back into the bladder.  A morcellator is placed through the resectoscope, and is used to suction out the prostate tissue that has been pushed into the bladder.  When the prostate tissue has been removed, the resectoscope is removed, and a foley catheter is placed to allow healing and drain the urine from the bladder.     What happens after a HoLEP?  More than 90% of patients go home the same day a few hours after surgery. Less than 10% will be admitted to the hospital overnight for observation to monitor the urine, or if they have other medical problems.  Fluid is flushed through the catheter for about 1 hour after surgery to clear any blood from the urine. It is normal to have some blood in the urine after surgery. The need for blood transfusion is extremely rare.  Eating and drinking are permitted after the procedure once the patient has fully awakened from anesthesia.  The catheter is usually removed 2-3 days after surgery- the patient will come to clinic to have the catheter removed and make sure they can urinate on their own.  It is very important to drink lots of fluids after surgery for one week to keep the bladder flushed.  At first, there may be some burning with urination, but this typically improved within a few hours to days. Most patients do not have a significant amount of pain, and narcotic pain medications are rarely needed.   Symptoms of urinary frequency, urgency, and even leakage are NORMAL for the first few weeks after surgery as the bladder adjusts after having to work hard against blockage from the prostate for many years. This will improve, but can sometimes take several months.  The use of pelvic floor exercises (Kegel exercises) can help improve problems with urinary incontinence.   After catheter removal, patients will be seen at 6 weeks and 6 months for symptom check  No heavy lifting for at least 2-3 weeks after surgery, however patients can walk and do light activities the first day after surgery. Return to work time depends on occupation.    What are the advantages of HoLEP?  HoLEP has been studied in many different parts of the world and has been shown to be a safe and effective procedure. Although there are many types of BPH surgeries available, HoLEP offers a unique advantage in being able to remove a large amount of tissue without any incisions on the body, even in very large prostates, while decreasing the risk of bleeding and providing tissue for pathology (to look for cancer). This decreases the need for blood transfusions during surgery, minimizes hospital stay, and reduces the risk of needing repeat treatment.  What are the side effects of HoLEP?  Temporary burning and bleeding during urination. Some blood may be seen in the urine for weeks after surgery and is part of the healing process.  Urinary incontinence (inability to control urine flow) is expected in all patients immediately after surgery and they should wear pads for the first few days/weeks. This typically improves over the course of several weeks. Performing Kegel exercises can help decrease leakage from stress maneuvers such as coughing, sneezing, or lifting. The rate of long term leakage is very low. Patients may also have leakage with urgency and this may be treated with medication. The risk of urge incontinence can be dependent on  several factors including age, prostate size, symptoms, and other medical problems.  Retrograde ejaculation or backwards ejaculation. In 75% of cases, the  patient will not see any fluid during ejaculation after surgery.  Erectile function is generally not significantly affected.   What are the risks of HoLEP?  Injury to the urethra or development of scar tissue at a later date  Injury to the capsule of the prostate (typically treated with longer catheterization).  Injury to the bladder or ureteral orifices (where the urine from the kidney drains out)  Infection of the bladder, testes, or kidneys  Return of urinary obstruction at a later date requiring another operation (<2%)  Need for blood transfusion or re-operation due to bleeding  Failure to relieve all symptoms and/or need for prolonged catheterization after surgery  5-15% of patients are found to have previously undiagnosed prostate cancer in their specimen. Prostate cancer can be treated after HoLEP.  Standard risks of anesthesia including blood clots, heart attacks, etc  When should I call my doctor?  Fever over 101.3 degrees  Inability to urinate, or large blood clots in the urine   UROLIFT is another prostate procedure that involves a minor surgery to pin the lobes of the prostate open to increase flow. Patient's are also able to go home the same day, and often leave without a catheter in place.

## 2018-07-24 NOTE — Progress Notes (Signed)
07/24/18 8:53 AM   Wesley Preston 07/10/1940 [id number]  Referring provider: Idelle Crouch, MD [place],  [place] [address]  CC: BPH/nocturia  HPI: I saw Mr. Korol in consultation from Dr. Doy Hutching for BPH and nocturia.  He is a very nice 78 year old male who is otherwise very healthy he reports over 10 years of urinary symptoms.  Notably, he had a TUNA procedure with Dr. Yves Dill 12 years ago that did not improve his symptoms at all.  His primary urinary complaint is nocturia 4 times per night, as well as some mild frequency during the day and weak stream.  He drinks 2 glasses of wine in the evening, and tries to minimize his fluids after 8:00 at night.  He is on doxazosin for over 4 years which he reports significantly improves his strength of stream.  He tried to stop this recently, noted a very weak stream and difficulty urinating.  He denies any significant urgency or urge incontinence.  Denies gross hematuria, history of UTI, and a history of urinary retention.  There are no aggravating factors.  Severity is moderate.  Both his daughters are physicians, one is a cardiologist and one is in psychiatry.  IPSS score today is 21, with quality of life mixed.  PVR today mildly elevated at 209cc.  PSA has been well within the normal range and stable, last checked 11/2017 and was 0.96.   PMH: Past Medical History:  Diagnosis Date   Anxiety    Back pain    BPH (benign prostatic hyperplasia)    Cancer (HCC)    BASAL CELL   Chest pain, non-cardiac    Colon polyps    COPD (chronic obstructive pulmonary disease) (HCC)    Depression    Essential tremor    Essential tremor    Family history of adverse reaction to anesthesia    DAUGHTERS GET NAUSEATED   GERD (gastroesophageal reflux disease)    H/O   Helicobacter pylori (H. pylori) infection    Hyperlipidemia    Hypertension    Neuromuscular disorder ([place])    TREMORS   Reactive  airway disease    Reactive airway disease    Restless leg syndrome     Surgical History: Past Surgical History:  Procedure Laterality Date   COLONOSCOPY  2013   COLONOSCOPY WITH PROPOFOL N/A 03/21/2016   Procedure: COLONOSCOPY WITH PROPOFOL;  Surgeon: Manya Silvas, MD;  Location[facility] ENDOSCOPY;  Service: Endoscopy;  Laterality: N/A;   COLONOSCOPY WITH PROPOFOL N/A 03/09/2018   Procedure: COLONOSCOPY WITH PROPOFOL;  Surgeon: Manya Silvas, MD;  Location: [facility] ENDOSCOPY;  Service: Endoscopy;  Laterality: N/A;   ESOPHAGOGASTRODUODENOSCOPY     ESOPHAGOGASTRODUODENOSCOPY N/A 03/09/2018   Procedure: ESOPHAGOGASTRODUODENOSCOPY (EGD);  Surgeon: Manya Silvas, MD;  Location: [facility] ENDOSCOPY;  Service: Endoscopy;  Laterality: N/A;   INGUINAL HERNIA REPAIR Right 08/01/2014   Procedure: RIGHT INGUINAL HERNIA  REPAIR WITH MESH ;  Surgeon: Robert Bellow, MD;  Location: [facility] ORS;  Service: General;  Laterality: Right; with large Ultra Pro mesh   NASAL SINUS SURGERY     ROTATOR CUFF REPAIR     TRIGGER FINGER RELEASE      Allergies:  Allergies  Allergen Reactions   Shellfish Allergy Anaphylaxis    Family History: Family History  Problem Relation Age of Onset   Heart disease Father     Social History:  reports that he quit smoking about 30 years ago. His smoking  use included cigarettes. He has a 20.00 pack-year smoking history. He has never used smokeless tobacco. He reports current alcohol use of about 24.0 standard drinks of alcohol per week. He reports that he does not use drugs.  ROS: Please see flowsheet from today's date for complete review of systems.  Physical Exam: BP 131/68    Pulse 68    Constitutional:  Alert and oriented, No acute distress. Cardiovascular: No clubbing, cyanosis, or edema. Respiratory: Normal respiratory effort, no increased work of breathing. GI: Abdomen is soft, nontender, nondistended, no abdominal masses GU: No CVA tenderness,  phallus without lesions DRE: 30g, smooth, no nodules Lymph: No cervical or inguinal lymphadenopathy. Skin: No rashes, bruises or suspicious lesions. Neurologic: Grossly intact, no focal deficits, moving all 4 extremities. Psychiatric: Normal mood and affect.  Laboratory Data: Reviewed sCr 0.8, eGFR >90  Urinalysis today pending  Pertinent Imaging: None to review  Assessment & Plan:   In summary, the patient is a healthy 78 year old male with history of TUNA procedure for BPH over 12 years ago with no improvement in his symptoms, and long-term use of doxazosin, whose primary complaint is nocturia 4 times per night and some issues with weak stream.  We discussed behavioral strategies at length for nocturia including minimizing fluids in the evening, cutting off fluids after 7 PM, double voiding prior to bed, and minimizing soda, caffeine, tea, and alcohol in the evenings.  We discussed the differences in urinary symptoms between BPH and OAB.  With his history of his urinary symptoms worsening when he stops doxazosin and elevated PVR, I do suspect there is a component of bladder outlet obstruction.  With his history of TUNA, I would recommend undergoing clinic cystoscopy prior to pursuing any outlet procedure to evaluate for stricture and prostatic anatomy after this procedure.  We briefly reviewed the different outlet procedures including TURP, HOLEP, and UroLift.  We also discussed other medication options including anticholinergics like oxybutynin, or even desmopressin for his nocturia.  We did discuss the risks of this with his age, and the need to check serum sodium before, 1 week after, and 1 month after starting desmopressin.  Virtual visit in 1 month to discuss his symptoms, consider scheduling clinic cystoscopy at that time if patient amenable   Billey Co, [place] [address] [place], [place] [address] [phone]

## 2018-07-30 DIAGNOSIS — E538 Deficiency of other specified B group vitamins: Secondary | ICD-10-CM | POA: Diagnosis not present

## 2018-08-16 DIAGNOSIS — R2689 Other abnormalities of gait and mobility: Secondary | ICD-10-CM | POA: Diagnosis not present

## 2018-08-16 DIAGNOSIS — R251 Tremor, unspecified: Secondary | ICD-10-CM | POA: Diagnosis not present

## 2018-08-21 ENCOUNTER — Telehealth (INDEPENDENT_AMBULATORY_CARE_PROVIDER_SITE_OTHER): Payer: Medicare HMO | Admitting: Urology

## 2018-08-21 ENCOUNTER — Other Ambulatory Visit: Payer: Self-pay

## 2018-08-21 DIAGNOSIS — N401 Enlarged prostate with lower urinary tract symptoms: Secondary | ICD-10-CM

## 2018-08-21 DIAGNOSIS — N138 Other obstructive and reflux uropathy: Secondary | ICD-10-CM | POA: Diagnosis not present

## 2018-08-21 NOTE — Progress Notes (Signed)
Virtual Visit via Telephone Note  I connected with Wesley Preston on 08/21/18 at  9:30 AM EDT by telephone and verified that I am speaking with the correct person using two identifiers.   I discussed the limitations, risks, security and privacy concerns of performing an evaluation and management service by telephone and the availability of in person appointments. We discussed the impact of the COVID-19 pandemic on the healthcare system, and the importance of social distancing and reducing patient and provider exposure. I also discussed with the patient that there may be a patient responsible charge related to this service. The patient expressed understanding and agreed to proceed.  Reason for visit: BPH/LUTS  History of Present Illness: I connected with Mr. Geno today to discuss his BPH and urinary symptoms.  Briefly, he is a very nice 78 year old male is otherwise healthy who has 10 years of urinary symptoms primarily nocturia 4 times per night, mild urinary frequency, and weak stream.  PVR previously was 200 cc.  His history is notable for a TUNA procedure with Dr. Yves Dill 12 years ago that did not significantly improve his urinary symptoms.  There is no history of UTI or urinary retention.  PSAs have been well within the normal range.  He is currently on doxazosin 4 mg daily.  At our last visit, we discussed behavioral strategies including minimizing fluids and especially alcohol in the evening, and double voiding prior to bed.  He reports moderate improvement in his symptoms by following the above strategies.  He still is bothered by nocturia 2-3 times per night and some weak stream during the day.  He is interested in pursuing cystoscopy in January or February when the pandemic has settled down for better evaluation of his prostate anatomy and consideration of possible HOLEP.  I also discussed the max dose of doxazosin would be 8 mg and he could consider trying 6 or 8 mg dose to see if it improves  his urinary symptoms.  I will cautioned him to monitor for any symptoms of low blood pressure like lightheadedness or weakness with the increased dose.  Follow Up: -RTC January or February for cystoscopy, possible transrectal ultrasound at that time if prostate appears very large, PVR at that visit   I discussed the assessment and treatment plan with the patient. The patient was provided an opportunity to ask questions and all were answered. The patient agreed with the plan and demonstrated an understanding of the instructions.   The patient was advised to call back or seek an in-person evaluation if the symptoms worsen or if the condition fails to improve as anticipated.  I provided 15 minutes of non-face-to-face time during this encounter.   Billey Co, MD

## 2018-08-31 DIAGNOSIS — E538 Deficiency of other specified B group vitamins: Secondary | ICD-10-CM | POA: Diagnosis not present

## 2018-10-04 DIAGNOSIS — E538 Deficiency of other specified B group vitamins: Secondary | ICD-10-CM | POA: Diagnosis not present

## 2018-11-05 DIAGNOSIS — E538 Deficiency of other specified B group vitamins: Secondary | ICD-10-CM | POA: Diagnosis not present

## 2018-11-13 DIAGNOSIS — L720 Epidermal cyst: Secondary | ICD-10-CM | POA: Diagnosis not present

## 2018-11-13 DIAGNOSIS — Z872 Personal history of diseases of the skin and subcutaneous tissue: Secondary | ICD-10-CM | POA: Diagnosis not present

## 2018-11-13 DIAGNOSIS — L570 Actinic keratosis: Secondary | ICD-10-CM | POA: Diagnosis not present

## 2018-11-13 DIAGNOSIS — L578 Other skin changes due to chronic exposure to nonionizing radiation: Secondary | ICD-10-CM | POA: Diagnosis not present

## 2018-11-13 DIAGNOSIS — Z85828 Personal history of other malignant neoplasm of skin: Secondary | ICD-10-CM | POA: Diagnosis not present

## 2018-12-06 DIAGNOSIS — E538 Deficiency of other specified B group vitamins: Secondary | ICD-10-CM | POA: Diagnosis not present

## 2018-12-10 DIAGNOSIS — E7800 Pure hypercholesterolemia, unspecified: Secondary | ICD-10-CM | POA: Diagnosis not present

## 2018-12-10 DIAGNOSIS — Z125 Encounter for screening for malignant neoplasm of prostate: Secondary | ICD-10-CM | POA: Diagnosis not present

## 2018-12-10 DIAGNOSIS — Z79899 Other long term (current) drug therapy: Secondary | ICD-10-CM | POA: Diagnosis not present

## 2018-12-10 DIAGNOSIS — E538 Deficiency of other specified B group vitamins: Secondary | ICD-10-CM | POA: Diagnosis not present

## 2018-12-10 DIAGNOSIS — I10 Essential (primary) hypertension: Secondary | ICD-10-CM | POA: Diagnosis not present

## 2018-12-17 DIAGNOSIS — R1013 Epigastric pain: Secondary | ICD-10-CM | POA: Diagnosis not present

## 2018-12-17 DIAGNOSIS — K5900 Constipation, unspecified: Secondary | ICD-10-CM | POA: Diagnosis not present

## 2018-12-17 DIAGNOSIS — Z0000 Encounter for general adult medical examination without abnormal findings: Secondary | ICD-10-CM | POA: Diagnosis not present

## 2018-12-17 DIAGNOSIS — Z87891 Personal history of nicotine dependence: Secondary | ICD-10-CM | POA: Diagnosis not present

## 2019-01-15 DIAGNOSIS — E538 Deficiency of other specified B group vitamins: Secondary | ICD-10-CM | POA: Diagnosis not present

## 2019-01-29 ENCOUNTER — Other Ambulatory Visit: Payer: Medicare HMO | Admitting: Urology

## 2019-01-30 DIAGNOSIS — Z79899 Other long term (current) drug therapy: Secondary | ICD-10-CM | POA: Diagnosis not present

## 2019-02-05 DIAGNOSIS — J42 Unspecified chronic bronchitis: Secondary | ICD-10-CM | POA: Diagnosis not present

## 2019-02-15 DIAGNOSIS — E538 Deficiency of other specified B group vitamins: Secondary | ICD-10-CM | POA: Diagnosis not present

## 2019-02-18 DIAGNOSIS — R2689 Other abnormalities of gait and mobility: Secondary | ICD-10-CM | POA: Diagnosis not present

## 2019-02-18 DIAGNOSIS — R251 Tremor, unspecified: Secondary | ICD-10-CM | POA: Diagnosis not present

## 2019-02-18 DIAGNOSIS — R42 Dizziness and giddiness: Secondary | ICD-10-CM | POA: Diagnosis not present

## 2019-02-19 DIAGNOSIS — M2021 Hallux rigidus, right foot: Secondary | ICD-10-CM | POA: Diagnosis not present

## 2019-02-19 DIAGNOSIS — M79671 Pain in right foot: Secondary | ICD-10-CM | POA: Diagnosis not present

## 2019-03-12 DIAGNOSIS — M2021 Hallux rigidus, right foot: Secondary | ICD-10-CM | POA: Diagnosis not present

## 2019-03-18 DIAGNOSIS — E538 Deficiency of other specified B group vitamins: Secondary | ICD-10-CM | POA: Diagnosis not present

## 2019-03-21 DIAGNOSIS — Z1331 Encounter for screening for depression: Secondary | ICD-10-CM | POA: Diagnosis not present

## 2019-03-21 DIAGNOSIS — J449 Chronic obstructive pulmonary disease, unspecified: Secondary | ICD-10-CM | POA: Diagnosis not present

## 2019-04-10 ENCOUNTER — Other Ambulatory Visit: Payer: Self-pay | Admitting: Physician Assistant

## 2019-04-10 ENCOUNTER — Ambulatory Visit
Admission: RE | Admit: 2019-04-10 | Discharge: 2019-04-10 | Disposition: A | Discharge status: Home / Self Care | Payer: Medicare Other | Source: Ambulatory Visit | Attending: Physician Assistant | Admitting: Physician Assistant

## 2019-04-10 ENCOUNTER — Other Ambulatory Visit: Payer: Self-pay

## 2019-04-10 DIAGNOSIS — N5089 Other specified disorders of the male genital organs: Secondary | ICD-10-CM

## 2019-04-18 ENCOUNTER — Encounter: Payer: Self-pay | Admitting: Urology

## 2019-04-18 ENCOUNTER — Ambulatory Visit: Payer: Medicare Other | Admitting: Urology

## 2019-04-18 ENCOUNTER — Other Ambulatory Visit: Payer: Self-pay

## 2019-04-18 VITALS — BP 152/78 | HR 67 | Ht 70.0 in | Wt 198.4 lb

## 2019-04-18 DIAGNOSIS — N401 Enlarged prostate with lower urinary tract symptoms: Secondary | ICD-10-CM

## 2019-04-18 DIAGNOSIS — N453 Epididymo-orchitis: Secondary | ICD-10-CM | POA: Diagnosis not present

## 2019-04-18 DIAGNOSIS — N138 Other obstructive and reflux uropathy: Secondary | ICD-10-CM | POA: Diagnosis not present

## 2019-04-18 DIAGNOSIS — R339 Retention of urine, unspecified: Secondary | ICD-10-CM

## 2019-04-18 LAB — BLADDER SCAN AMB NON-IMAGING

## 2019-04-18 NOTE — Patient Instructions (Signed)
Cystoscopy Cystoscopy is a procedure that is used to help diagnose and sometimes treat conditions that affect the lower urinary tract. The lower urinary tract includes the bladder and the urethra. The urethra is the tube that drains urine from the bladder. Cystoscopy is done using a thin, tube-shaped instrument with a light and camera at the end (cystoscope). The cystoscope may be hard or flexible, depending on the goal of the procedure. The cystoscope is inserted through the urethra, into the bladder. Cystoscopy may be recommended if you have:  Urinary tract infections that keep coming back.  Blood in the urine (hematuria).  An inability to control when you urinate (urinary incontinence) or an overactive bladder.  Unusual cells found in a urine sample.  A blockage in the urethra, such as a urinary stone.  Painful urination.  An abnormality in the bladder found during an intravenous pyelogram (IVP) or CT scan. Cystoscopy may also be done to remove a sample of tissue to be examined under a microscope (biopsy). Tell a health care provider about:  Any allergies you have.  All medicines you are taking, including vitamins, herbs, eye drops, creams, and over-the-counter medicines.  Any problems you or family members have had with anesthetic medicines.  Any blood disorders you have.  Any surgeries you have had.  Any medical conditions you have.  Whether you are pregnant or may be pregnant. What are the risks? Generally, this is a safe procedure. However, problems may occur, including:  Infection.  Bleeding.  Allergic reactions to medicines.  Damage to other structures or organs. What happens before the procedure?  Ask your health care provider about: ? Changing or stopping your regular medicines. This is especially important if you are taking diabetes medicines or blood thinners. ? Taking medicines such as aspirin and ibuprofen. These medicines can thin your blood. Do not take  these medicines unless your health care provider tells you to take them. ? Taking over-the-counter medicines, vitamins, herbs, and supplements.  Follow instructions from your health care provider about eating or drinking restrictions.  Ask your health care provider what steps will be taken to help prevent infection. These may include: ? Washing skin with a germ-killing soap. ? Taking antibiotic medicine.  You may have an exam or testing, such as: ? X-rays of the bladder, urethra, or kidneys. ? Urine tests to check for signs of infection.  Plan to have someone take you home from the hospital or clinic. What happens during the procedure?   You will be given one or more of the following: ? A medicine to help you relax (sedative). ? A medicine to numb the area (local anesthetic).  The area around the opening of your urethra will be cleaned.  The cystoscope will be passed through your urethra into your bladder.  Germ-free (sterile) fluid will flow through the cystoscope to fill your bladder. The fluid will stretch your bladder so that your health care provider can clearly examine your bladder walls.  Your doctor will look at the urethra and bladder. Your doctor may take a biopsy or remove stones.  The cystoscope will be removed, and your bladder will be emptied. The procedure may vary among health care providers and hospitals. What can I expect after the procedure? After the procedure, it is common to have:  Some soreness or pain in your abdomen and urethra.  Urinary symptoms. These include: ? Mild pain or burning when you urinate. Pain should stop within a few minutes after you urinate. This   may last for up to 1 week. ? A small amount of blood in your urine for several days. ? Feeling like you need to urinate but producing only a small amount of urine. Follow these instructions at home: Medicines  Take over-the-counter and prescription medicines only as told by your health care  provider.  If you were prescribed an antibiotic medicine, take it as told by your health care provider. Do not stop taking the antibiotic even if you start to feel better. General instructions  Return to your normal activities as told by your health care provider. Ask your health care provider what activities are safe for you.  Do not drive for 24 hours if you were given a sedative during your procedure.  Watch for any blood in your urine. If the amount of blood in your urine increases, call your health care provider.  Follow instructions from your health care provider about eating or drinking restrictions.  If a tissue sample was removed for testing (biopsy) during your procedure, it is up to you to get your test results. Ask your health care provider, or the department that is doing the test, when your results will be ready.  Drink enough fluid to keep your urine pale yellow.  Keep all follow-up visits as told by your health care provider. This is important. Contact a health care provider if you:  Have pain that gets worse or does not get better with medicine, especially pain when you urinate.  Have trouble urinating.  Have more blood in your urine. Get help right away if you:  Have blood clots in your urine.  Have abdominal pain.  Have a fever or chills.  Are unable to urinate. Summary  Cystoscopy is a procedure that is used to help diagnose and sometimes treat conditions that affect the lower urinary tract.  Cystoscopy is done using a thin, tube-shaped instrument with a light and camera at the end.  After the procedure, it is common to have some soreness or pain in your abdomen and urethra.  Watch for any blood in your urine. If the amount of blood in your urine increases, call your health care provider.  If you were prescribed an antibiotic medicine, take it as told by your health care provider. Do not stop taking the antibiotic even if you start to feel better. This  information is not intended to replace advice given to you by your health care provider. Make sure you discuss any questions you have with your health care provider. Document Revised: 12/12/2017 Document Reviewed: 12/12/2017 Elsevier Patient Education  [place].   Holmium Laser Enucleation of the Prostate (HoLEP)  HoLEP is a treatment for men with benign prostatic hyperplasia (BPH). The laser surgery removed blockages of urine flow, and is done without any incisions on the body.     What is HoLEP?  HoLEP is a type of laser surgery used to treat obstruction (blockage) of urine flow as a result of benign prostatic hyperplasia (BPH). In men with BPH, the prostate gland is not cancerous, but has become enlarged. An enlarged prostate can result in a number of urinary tract symptoms such as weak urinary stream, difficulty in starting urination, inability to urinate, frequent urination, or getting up at night to urinate.  HoLEP was developed in the 1990's as a more effective and less expensive surgical option for BPH, compared to other surgical options such as laser vaporization(PVP/greenlight laser), transurethral resection of the prostate(TURP), and open simple prostatectomy.  What happens during a HoLEP?  HoLEP requires general anesthesia ("asleep" throughout the procedure).   An antibiotic is given to reduce the risk of infection  A surgical instrument called a resectoscope is inserted through the urethra (the tube that carries urine from the bladder). The resectoscope has a camera that allows the surgeon to view the internal structure of the prostate gland, and to see where the incisions are being made during surgery.  The laser is inserted into the resectoscope and is used to enucleate (free up) the enlarged prostate tissue from the capsule (outer shell) and then to seal up any blood vessels. The tissue that has been removed is pushed back into the bladder.  A morcellator is  placed through the resectoscope, and is used to suction out the prostate tissue that has been pushed into the bladder.  When the prostate tissue has been removed, the resectoscope is removed, and a foley catheter is placed to allow healing and drain the urine from the bladder.     What happens after a HoLEP?  More than 90% of patients go home the same day a few hours after surgery. Less than 10% will be admitted to the hospital overnight for observation to monitor the urine, or if they have other medical problems.  Fluid is flushed through the catheter for about 1 hour after surgery to clear any blood from the urine. It is normal to have some blood in the urine after surgery. The need for blood transfusion is extremely rare.  Eating and drinking are permitted after the procedure once the patient has fully awakened from anesthesia.  The catheter is usually removed 2-3 days after surgery- the patient will come to clinic to have the catheter removed and make sure they can urinate on their own.  It is very important to drink lots of fluids after surgery for one week to keep the bladder flushed.  At first, there may be some burning with urination, but this typically improved within a few hours to days. Most patients do not have a significant amount of pain, and narcotic pain medications are rarely needed.  Symptoms of urinary frequency, urgency, and even leakage are NORMAL for the first few weeks after surgery as the bladder adjusts after having to work hard against blockage from the prostate for many years. This will improve, but can sometimes take several months.  The use of pelvic floor exercises (Kegel exercises) can help improve problems with urinary incontinence.   After catheter removal, patients will be seen at 6 weeks and 6 months for symptom check  No heavy lifting for at least 2-3 weeks after surgery, however patients can walk and do light activities the first day after surgery.  Return to work time depends on occupation.    What are the advantages of HoLEP?  HoLEP has been studied in many different parts of the world and has been shown to be a safe and effective procedure. Although there are many types of BPH surgeries available, HoLEP offers a unique advantage in being able to remove a large amount of tissue without any incisions on the body, even in very large prostates, while decreasing the risk of bleeding and providing tissue for pathology (to look for cancer). This decreases the need for blood transfusions during surgery, minimizes hospital stay, and reduces the risk of needing repeat treatment.  What are the side effects of HoLEP?  Temporary burning and bleeding during urination. Some blood may be seen in the urine for  weeks after surgery and is part of the healing process.  Urinary incontinence (inability to control urine flow) is expected in all patients immediately after surgery and they should wear pads for the first few days/weeks. This typically improves over the course of several weeks. Performing Kegel exercises can help decrease leakage from stress maneuvers such as coughing, sneezing, or lifting. The rate of long term leakage is very low. Patients may also have leakage with urgency and this may be treated with medication. The risk of urge incontinence can be dependent on several factors including age, prostate size, symptoms, and other medical problems.  Retrograde ejaculation or "backwards ejaculation." In 75% of cases, the patient will not see any fluid during ejaculation after surgery.  Erectile function is generally not significantly affected.   What are the risks of HoLEP?  Injury to the urethra or development of scar tissue at a later date  Injury to the capsule of the prostate (typically treated with longer catheterization).  Injury to the bladder or ureteral orifices (where the urine from the kidney drains out)  Infection of the bladder,  testes, or kidneys  Return of urinary obstruction at a later date requiring another operation (<2%)  Need for blood transfusion or re-operation due to bleeding  Failure to relieve all symptoms and/or need for prolonged catheterization after surgery  5-15% of patients are found to have previously undiagnosed prostate cancer in their specimen. Prostate cancer can be treated after HoLEP.  Standard risks of anesthesia including blood clots, heart attacks, etc  When should I call my doctor?  Fever over 101.3 degrees  Inability to urinate, or large blood clots in the urine

## 2019-04-18 NOTE — Progress Notes (Signed)
   04/18/2019 5:11 PM   [name] December 10, 1940 [id number]  Reason for visit: Epididymitis, BPH and incomplete emptying  HPI: I saw Mr. Bado back in urology clinic today.  To briefly summarize, he is a 79 year old very healthy male who has had a long history of urinary symptoms, including a TUNA procedure with Dr. Yves Dill over 10 years ago with no improvement in his urination.  He has been on doxazosin long-term.  His primary urinary complaint continues to be nocturia 4-5 times per night, incomplete emptying(PVRs have ranged from 200-39ml), and weak stream.  His PVR today is elevated at 294 mL.  We previously had discussed cystoscopy and TRUS for better evaluation of his urinary symptoms with history of a prostate procedure, but he put this off during the Covid pandemic.  He was recently diagnosed with epididymitis by his PCP and scrotal ultrasound 4/7 confirmed epididymitis.  He was treated with Cipro, and he reports his left scrotal swelling and pain have improved significantly.  On exam today, the left testicle remains edematous and mildly tender, though he reports significantly improved from prior.  I again recommended a cystoscopy and transrectal ultrasound to evaluate prostate volume in around 3 to 4 weeks after he is recovered from his epididymitis for better evaluation of his anatomy after prior TUNA/evaluate for stricture, and to see if he is a candidate for an outlet procedure.  We discussed the risks and benefits at length, and he would like to proceed.  We discussed the risks of urinary retention and recurrent infections if his incomplete bladder emptying goes untreated.  RTC for cystoscopy and transrectal ultrasound to evaluate prostate volume  Billey Co, MD  [facility] [address], [place], [place] [address] [phone]

## 2019-04-29 ENCOUNTER — Ambulatory Visit (INDEPENDENT_AMBULATORY_CARE_PROVIDER_SITE_OTHER): Payer: Medicare Other | Admitting: Urology

## 2019-04-29 ENCOUNTER — Other Ambulatory Visit: Payer: Self-pay

## 2019-04-29 ENCOUNTER — Encounter: Payer: Self-pay | Admitting: Urology

## 2019-04-29 VITALS — BP 150/74 | HR 69 | Ht 70.0 in | Wt 198.0 lb

## 2019-04-29 DIAGNOSIS — N138 Other obstructive and reflux uropathy: Secondary | ICD-10-CM | POA: Diagnosis not present

## 2019-04-29 DIAGNOSIS — N401 Enlarged prostate with lower urinary tract symptoms: Secondary | ICD-10-CM | POA: Diagnosis not present

## 2019-04-29 DIAGNOSIS — N390 Urinary tract infection, site not specified: Secondary | ICD-10-CM

## 2019-04-29 MED ORDER — LIDOCAINE HCL URETHRAL/MUCOSAL 2 % EX GEL
1.0000 "application " | Freq: Once | CUTANEOUS | Status: AC
  Administered 2019-04-29: 1 via URETHRAL

## 2019-04-29 NOTE — Progress Notes (Signed)
Cystoscopy and TRUS procedure Note:  Indication: BPH, incomplete bladder emptying, UTIs  After informed consent and discussion of the procedure and its risks, Wesley Preston was positioned and prepped in the standard fashion. Cystoscopy was performed with a flexible cystoscope.  The urethra was diffusely subtly narrowed, but easily accommodated the flexible cystoscope.  The urethra, bladder neck and entire bladder was visualized in a standard fashion. The prostate was moderate in size with a high bladder neck. The ureteral orifices were visualized in their normal location and orientation.  Cystoscopy with no bladder lesions.  No abnormalities on retroflexion  Transrectal ultrasound of the prostate demonstrated a 45 g prostate with no median lobe or other abnormalities  Findings: 45 g prostate, high bladder neck, no intravesical lobe, mildly diffusely narrowed urethra  Assessment and Plan: In summary, is a 79 year old male with a long history of incomplete bladder emptying and PVR greater than 200 mL, UTI, and bothersome urinary symptoms.  When Wesley Preston stops Wesley Preston doxazosin Wesley Preston urinary symptoms acutely worsen.  I had a long conversation with the Wesley Preston and Wesley Preston today about Wesley Preston urinary symptoms and findings today on cystoscopy and transrectal ultrasound and treatment options.  With Wesley Preston elevated PVRs and symptoms that worsen when stopping doxazosin, I do feel Wesley Preston would benefit from an outlet procedure.  We discussed the risks and benefits of HoLEP at length.  The procedure requires general anesthesia and takes 2 to 3 hours, and a holmium laser is used to enucleate the prostate and push this tissue into the bladder.  A morcellator is then used to remove this tissue, which is sent for pathology.  The vast majority of patients are able to discharge the same day with a catheter in place for 2 to 3 days, and will follow-up in clinic for a voiding trial.  Approximately 5% of patients will be admitted overnight to  monitor the urine, or if they have multiple co-morbidities.  We specifically discussed the risks of bleeding, infection, retrograde ejaculation, temporary urgency and urge incontinence, very low risk of long-term incontinence, pathologic evaluation of prostate tissue and possible detection of prostate cancer or other malignancy, and possible need for additional procedures.  Schedule HOLEP  Wesley Madrid, MD 04/29/2019

## 2019-04-29 NOTE — Patient Instructions (Signed)

## 2019-04-30 LAB — URINALYSIS, COMPLETE
Bilirubin, UA: NEGATIVE
Glucose, UA: NEGATIVE
Ketones, UA: NEGATIVE
Leukocytes,UA: NEGATIVE
Nitrite, UA: NEGATIVE
Protein,UA: NEGATIVE
RBC, UA: NEGATIVE
Specific Gravity, UA: 1.025 (ref 1.005–1.030)
Urobilinogen, Ur: 1 mg/dL (ref 0.2–1.0)
pH, UA: 7 (ref 5.0–7.5)

## 2019-04-30 LAB — MICROSCOPIC EXAMINATION: Epithelial Cells (non renal): NONE SEEN /hpf (ref 0–10)

## 2019-05-02 ENCOUNTER — Other Ambulatory Visit: Payer: Self-pay | Admitting: Urology

## 2019-05-02 DIAGNOSIS — N138 Other obstructive and reflux uropathy: Secondary | ICD-10-CM

## 2019-05-03 ENCOUNTER — Other Ambulatory Visit: Payer: Self-pay

## 2019-05-03 ENCOUNTER — Encounter
Admission: RE | Admit: 2019-05-03 | Discharge: 2019-05-03 | Disposition: A | Discharge status: Home / Self Care | Payer: Medicare Other | Source: Ambulatory Visit | Attending: Urology | Admitting: Urology

## 2019-05-03 HISTORY — DX: Other complications of anesthesia, initial encounter: T88.59XA

## 2019-05-03 NOTE — Patient Instructions (Signed)
Your procedure is scheduled on: 05/10/19 Report to [facility]. To find out your arrival time please call [phone] between 1PM - 3PM on 05/09/19.  Remember: Instructions that are not followed completely may result in serious medical risk, up to and including death, or upon the discretion of your surgeon and anesthesiologist your surgery may need to be rescheduled.     _X__ 1. Do not eat food after midnight the night before your procedure.                 No gum chewing or hard candies. You may drink clear liquids up to 2 hours                 before you are scheduled to arrive for your surgery- DO not drink clear                 liquids within 2 hours of the start of your surgery.                 Clear Liquids include:  water, apple juice without pulp, clear carbohydrate                 drink such as Clearfast or Gatorade, Black Coffee or Tea (Do not add                 anything to coffee or tea). Diabetics water only  __X__2.  On the morning of surgery brush your teeth with toothpaste and water, you                 may rinse your mouth with mouthwash if you wish.  Do not swallow any              toothpaste of mouthwash.     _X__ 3.  No Alcohol for 24 hours before or after surgery.   _X__ 4.  Do Not Smoke or use e-cigarettes For 24 Hours Prior to Your Surgery.                 Do not use any chewable tobacco products for at least 6 hours prior to                 surgery.  ____  5.  Bring all medications with you on the day of surgery if instructed.   __X__  6.  Notify your doctor if there is any change in your medical condition      (cold, fever, infections).     Do not wear jewelry, make-up, hairpins, clips or nail polish. Do not wear lotions, powders, or perfumes.  Do not shave 48 hours prior to surgery. Men may shave face and neck. Do not bring valuables to the hospital.    [facility] is not responsible for any belongings or  valuables.  Contacts, dentures/partials or body piercings may not be worn into surgery. Bring a case for your contacts, glasses or hearing aids, a denture cup will be supplied. Leave your suitcase in the car. After surgery it may be brought to your room. For patients admitted to the hospital, discharge time is determined by your treatment team.   Patients discharged the day of surgery will not be allowed to drive home.   Please read over the following fact sheets that you were given:   MRSA Information  __X__ Take these medicines the morning of surgery with A SIP OF WATER:  1. omeprazole (PRILOSEC) 40 MG capsule  2. primidone (MYSOLINE) 50 MG tablet  3. propranolol (INDERAL) 20 MG tablet  4. May use Nasacort if needed  5.  6.  ____ Fleet Enema (as directed)   __ __ Use CHG Soap/SAGE wipes as directed  __X__ Use inhalers on the day of surgery  ____ Stop metformin/Janumet/Farxiga 2 days prior to surgery    ____ Take 1/2 of usual insulin dose the night before surgery. No insulin the morning          of surgery.   ____ Stop Blood Thinners Coumadin/Plavix/Xarelto/Pleta/Pradaxa/Eliquis/Effient/Aspirin  on   Or contact your Surgeon, Cardiologist or Medical Doctor regarding  ability to stop your blood thinners  __X__ Stop Anti-inflammatories 7 days before surgery such as Advil, Ibuprofen, Motrin,  BC or Goodies Powder, Naprosyn, Naproxen, Aleve, Aspirin    __X__ Stop all herbal supplements, fish oil or vitamin E until after surgery.    ____ Bring C-Pap to the hospital.     Indwelling Urinary Catheter Care, Adult  An indwelling urinary catheter is a thin, flexible, germ-free (sterile) tube that is placed into the bladder to help drain urine out of the body. The catheter is inserted into the part of the body that drains urine from the bladder (urethra). Urine drains from the catheter into a drainage bag outside of the body. Taking good care of your catheter will keep it working  properly and help to prevent problems from developing. What are the risks?  Bacteria may get into your bladder and cause a urinary tract infection.  Urine flow can become blocked. This can happen if the catheter is not working correctly, or if you have sediment or a blood clot in your bladder or the catheter.  Tissue near the catheter may become irritated and bleed. How to wear your catheter and your drainage bag Supplies needed  Adhesive tape or a leg strap.  Alcohol wipe or soap and water (if you use tape).  A clean towel (if you use tape).  Overnight drainage bag.  Smaller drainage bag (leg bag). Wearing your catheter and bag Use adhesive tape or a leg strap to attach your catheter to your leg.  Make sure the catheter is not pulled tight.  If a leg strap gets wet, replace it with a dry one.  If you use adhesive tape: 1. Use an alcohol wipe or soap and water to wash off any stickiness on your skin where you had tape before. 2. Use a clean towel to pat-dry the area. 3. Apply the new tape. You should have received a large overnight drainage bag and a smaller leg bag that fits underneath clothing.  You may wear the overnight bag at any time, but you should not wear the leg bag at night.  Always wear the leg bag below your knee.  Make sure the overnight drainage bag is always lower than the level of your bladder, but do not let it touch the floor. Before you go to sleep, hang the bag inside a wastebasket that is covered by a clean plastic bag. How to care for your skin around the catheter     Supplies needed  A clean washcloth.  Water and mild soap.  A clean towel. Caring for your skin and catheter  Every day, use a clean washcloth and soapy water to clean the skin around your catheter. 1. Wash your hands with soap and water. 2. Wet a washcloth in warm water and mild soap. 3.  Clean the skin around your urethra.  If you are male:  Use one hand to gently spread  the folds of skin around your vagina (labia).  With the washcloth in your other hand, wipe the inner side of your labia on each side. Do this in a front-to-back direction.  If you are male:  Use one hand to pull back any skin that covers the end of your penis (foreskin).  With the washcloth in your other hand, wipe your penis in small circles. Start wiping at the tip of your penis, then move outward from the catheter.  Move the foreskin back in place, if this applies. 4. With your free hand, hold the catheter close to where it enters your body. Keep holding the catheter during cleaning so it does not get pulled out. 5. Use your other hand to clean the catheter with the washcloth.  Only wipe downward on the catheter.  Do not wipe upward toward your body, because that may push bacteria into your urethra and cause infection. 6. Use a clean towel to pat-dry the catheter and the skin around it. Make sure to wipe off all soap. 7. Wash your hands with soap and water.  Shower every day. Do not take baths.  Do not use cream, ointment, or lotion on the area where the catheter enters your body, unless your health care provider tells you to do that.  Do not use powders, sprays, or lotions on your genital area.  Check your skin around the catheter every day for signs of infection. Check for: ? Redness, swelling, or pain. ? Fluid or blood. ? Warmth. ? Pus or a bad smell. How to empty the drainage bag Supplies needed  Rubbing alcohol.  Gauze pad or cotton ball.  Adhesive tape or a leg strap. Emptying the bag Empty your drainage bag (your overnight drainage bag or your leg bag) when it is ?- full, or at least 2-3 times a day. Clean the drainage bag according to the manufacturer's instructions or as told by your health care provider. 1. Wash your hands with soap and water. 2. Detach the drainage bag from your leg. 3. Hold the drainage bag over the toilet or a clean container. Make sure the  drainage bag is lower than your hips and bladder. This stops urine from going back into the tubing and into your bladder. 4. Open the pour spout at the bottom of the bag. 5. Empty the urine into the toilet or container. Do not let the pour spout touch any surface. This precaution is important to prevent bacteria from getting in the bag and causing infection. 6. Apply rubbing alcohol to a gauze pad or cotton ball. 7. Use the gauze pad or cotton ball to clean the pour spout. 8. Close the pour spout. 9. Attach the bag to your leg with adhesive tape or a leg strap. 10. Wash your hands with soap and water. How to change the drainage bag Supplies needed:  Alcohol wipes.  A clean drainage bag.  Adhesive tape or a leg strap. Changing the bag Replace your drainage bag with a clean bag if it leaks, starts to smell bad, or looks dirty. 1. Wash your hands with soap and water. 2. Detach the dirty drainage bag from your leg. 3. Pinch the catheter with your fingers so that urine does not spill out. 4. Disconnect the catheter tube from the drainage tube at the connection valve. Do not let the tubes touch any surface. 5. Clean  the end of the catheter tube with an alcohol wipe. Use a different alcohol wipe to clean the end of the drainage tube. 6. Connect the catheter tube to the drainage tube of the clean bag. 7. Attach the clean bag to your leg with adhesive tape or a leg strap. Avoid attaching the new bag too tightly. 8. Wash your hands with soap and water. General instructions   Never pull on your catheter or try to remove it. Pulling can damage your internal tissues.  Always wash your hands before and after you handle your catheter or drainage bag. Use a mild, fragrance-free soap. If soap and water are not available, use hand sanitizer.  Always make sure there are no twists or bends (kinks) in the catheter tube.  Always make sure there are no leaks in the catheter or drainage bag.  Drink  enough fluid to keep your urine pale yellow.  Do not take baths, swim, or use a hot tub.  If you are male, wipe from front to back after having a bowel movement. Contact a health care provider if:  Your urine is cloudy.  Your urine smells unusually bad.  Your catheter gets clogged.  Your catheter starts to leak.  Your bladder feels full. Get help right away if:  You have redness, swelling, or pain where the catheter enters your body.  You have fluid, blood, pus, or a bad smell coming from the area where the catheter enters your body.  The area where the catheter enters your body feels warm to the touch.  You have a fever.  You have pain in your abdomen, legs, lower back, or bladder.  You see blood in the catheter.  Your urine is pink or red.  You have nausea, vomiting, or chills.  Your urine is not draining into the bag.  Your catheter gets pulled out. Summary  An indwelling urinary catheter is a thin, flexible, germ-free (sterile) tube that is placed into the bladder to help drain urine out of the body.  The catheter is inserted into the part of the body that drains urine from the bladder (urethra).  Take good care of your catheter to keep it working properly and help prevent problems from developing.  Always wash your hands before and after you handle your catheter or drainage bag.  Never pull on your catheter or try to remove it. This information is not intended to replace advice given to you by your health care provider. Make sure you discuss any questions you have with your health care provider. Document Revised: 04/13/2018 Document Reviewed: 08/05/2016 Elsevier Patient Education  [place].

## 2019-05-06 ENCOUNTER — Other Ambulatory Visit: Payer: Self-pay

## 2019-05-06 ENCOUNTER — Encounter
Admission: RE | Admit: 2019-05-06 | Discharge: 2019-05-06 | Disposition: A | Discharge status: Home / Self Care | Payer: Medicare Other | Source: Ambulatory Visit | Attending: Urology | Admitting: Urology

## 2019-05-06 DIAGNOSIS — R9431 Abnormal electrocardiogram [ECG] [EKG]: Secondary | ICD-10-CM | POA: Insufficient documentation

## 2019-05-06 DIAGNOSIS — I4510 Unspecified right bundle-branch block: Secondary | ICD-10-CM | POA: Diagnosis not present

## 2019-05-06 DIAGNOSIS — Z01818 Encounter for other preprocedural examination: Secondary | ICD-10-CM | POA: Insufficient documentation

## 2019-05-06 LAB — CBC
HCT: 45.7 % (ref 39.0–52.0)
Hemoglobin: 15 g/dL (ref 13.0–17.0)
MCH: 30.5 pg (ref 26.0–34.0)
MCHC: 32.8 g/dL (ref 30.0–36.0)
MCV: 92.9 fL (ref 80.0–100.0)
Platelets: 227 10*3/uL (ref 150–400)
RBC: 4.92 MIL/uL (ref 4.22–5.81)
RDW: 13.2 % (ref 11.5–15.5)
WBC: 6.4 10*3/uL (ref 4.0–10.5)
nRBC: 0 % (ref 0.0–0.2)

## 2019-05-06 LAB — BASIC METABOLIC PANEL
Anion gap: 6 (ref 5–15)
BUN: 18 mg/dL (ref 8–23)
CO2: 29 mmol/L (ref 22–32)
Calcium: 9.1 mg/dL (ref 8.9–10.3)
Chloride: 106 mmol/L (ref 98–111)
Creatinine, Ser: 0.7 mg/dL (ref 0.61–1.24)
GFR calc Af Amer: >60 mL/min (ref >60)
GFR calc non Af Amer: >60 mL/min (ref >60)
Glucose, Bld: 130 mg/dL — ABNORMAL HIGH (ref 70–99)
Potassium: 3.9 mmol/L (ref 3.5–5.1)
Sodium: 141 mmol/L (ref 135–145)

## 2019-05-08 ENCOUNTER — Other Ambulatory Visit
Admission: RE | Admit: 2019-05-08 | Discharge: 2019-05-08 | Disposition: A | Discharge status: Home / Self Care | Payer: Medicare Other | Source: Ambulatory Visit | Attending: Urology | Admitting: Urology

## 2019-05-08 ENCOUNTER — Other Ambulatory Visit: Payer: Self-pay

## 2019-05-08 DIAGNOSIS — Z20822 Contact with and (suspected) exposure to covid-19: Secondary | ICD-10-CM | POA: Diagnosis not present

## 2019-05-08 DIAGNOSIS — Z01812 Encounter for preprocedural laboratory examination: Secondary | ICD-10-CM | POA: Diagnosis present

## 2019-05-08 LAB — SARS CORONAVIRUS 2 (TAT 6-24 HRS): SARS Coronavirus 2: NEGATIVE

## 2019-05-09 MED ORDER — CIPROFLOXACIN IN D5W 400 MG/200ML IV SOLN
400.0000 mg | INTRAVENOUS | Status: AC
  Administered 2019-05-10: 400 mg via INTRAVENOUS

## 2019-05-10 ENCOUNTER — Ambulatory Visit: Payer: Medicare Other | Admitting: Certified Registered"

## 2019-05-10 ENCOUNTER — Ambulatory Visit
Admission: RE | Admit: 2019-05-10 | Discharge: 2019-05-10 | Disposition: A | Discharge status: Home / Self Care | Payer: Medicare Other | Source: Ambulatory Visit | Attending: Urology | Admitting: Urology

## 2019-05-10 ENCOUNTER — Encounter
Admission: RE | Disposition: A | Discharge status: Home / Self Care | Payer: Self-pay | Source: Ambulatory Visit | Attending: Urology

## 2019-05-10 ENCOUNTER — Encounter: Payer: Self-pay | Admitting: Urology

## 2019-05-10 ENCOUNTER — Other Ambulatory Visit: Payer: Self-pay

## 2019-05-10 DIAGNOSIS — Z8249 Family history of ischemic heart disease and other diseases of the circulatory system: Secondary | ICD-10-CM | POA: Insufficient documentation

## 2019-05-10 DIAGNOSIS — K219 Gastro-esophageal reflux disease without esophagitis: Secondary | ICD-10-CM | POA: Diagnosis not present

## 2019-05-10 DIAGNOSIS — R0789 Other chest pain: Secondary | ICD-10-CM | POA: Diagnosis not present

## 2019-05-10 DIAGNOSIS — Z87891 Personal history of nicotine dependence: Secondary | ICD-10-CM | POA: Diagnosis not present

## 2019-05-10 DIAGNOSIS — Z85828 Personal history of other malignant neoplasm of skin: Secondary | ICD-10-CM | POA: Insufficient documentation

## 2019-05-10 DIAGNOSIS — J449 Chronic obstructive pulmonary disease, unspecified: Secondary | ICD-10-CM | POA: Diagnosis not present

## 2019-05-10 DIAGNOSIS — N138 Other obstructive and reflux uropathy: Secondary | ICD-10-CM | POA: Insufficient documentation

## 2019-05-10 DIAGNOSIS — N401 Enlarged prostate with lower urinary tract symptoms: Secondary | ICD-10-CM | POA: Insufficient documentation

## 2019-05-10 DIAGNOSIS — R0602 Shortness of breath: Secondary | ICD-10-CM | POA: Diagnosis not present

## 2019-05-10 DIAGNOSIS — Z86010 Personal history of colonic polyps: Secondary | ICD-10-CM | POA: Insufficient documentation

## 2019-05-10 DIAGNOSIS — Z87440 Personal history of urinary (tract) infections: Secondary | ICD-10-CM | POA: Diagnosis not present

## 2019-05-10 DIAGNOSIS — G2581 Restless legs syndrome: Secondary | ICD-10-CM | POA: Diagnosis not present

## 2019-05-10 DIAGNOSIS — F419 Anxiety disorder, unspecified: Secondary | ICD-10-CM | POA: Diagnosis not present

## 2019-05-10 DIAGNOSIS — E785 Hyperlipidemia, unspecified: Secondary | ICD-10-CM | POA: Insufficient documentation

## 2019-05-10 DIAGNOSIS — R3914 Feeling of incomplete bladder emptying: Secondary | ICD-10-CM | POA: Diagnosis not present

## 2019-05-10 DIAGNOSIS — G250 Essential tremor: Secondary | ICD-10-CM | POA: Diagnosis not present

## 2019-05-10 DIAGNOSIS — F329 Major depressive disorder, single episode, unspecified: Secondary | ICD-10-CM | POA: Insufficient documentation

## 2019-05-10 HISTORY — PX: HOLEP-LASER ENUCLEATION OF THE PROSTATE WITH MORCELLATION: SHX6641

## 2019-05-10 SURGERY — ENUCLEATION, PROSTATE, USING LASER, WITH MORCELLATION
Anesthesia: General

## 2019-05-10 MED ORDER — OXYCODONE HCL 5 MG PO TABS
5.0000 mg | ORAL_TABLET | Freq: Once | ORAL | Status: AC | PRN
  Administered 2019-05-10: 5 mg via ORAL

## 2019-05-10 MED ORDER — OXYCODONE HCL 5 MG PO TABS
ORAL_TABLET | ORAL | Status: AC
  Filled 2019-05-10: qty 1

## 2019-05-10 MED ORDER — CIPROFLOXACIN IN D5W 400 MG/200ML IV SOLN
INTRAVENOUS | Status: AC
  Filled 2019-05-10: qty 200

## 2019-05-10 MED ORDER — PROPOFOL 10 MG/ML IV BOLUS
INTRAVENOUS | Status: AC
  Filled 2019-05-10: qty 20

## 2019-05-10 MED ORDER — OXYCODONE HCL 5 MG/5ML PO SOLN: 5.0000 mg | Freq: Once | ORAL | Status: AC | PRN

## 2019-05-10 MED ORDER — SUCCINYLCHOLINE CHLORIDE 200 MG/10ML IV SOSY
PREFILLED_SYRINGE | INTRAVENOUS | Status: AC
  Filled 2019-05-10: qty 10

## 2019-05-10 MED ORDER — SODIUM CHLORIDE 0.9 % IV SOLN
INTRAVENOUS | Status: DC | PRN
  Administered 2019-05-10: 50 ug/min via INTRAVENOUS

## 2019-05-10 MED ORDER — ONDANSETRON HCL 4 MG/2ML IJ SOLN
INTRAMUSCULAR | Status: DC | PRN
  Administered 2019-05-10: 4 mg via INTRAVENOUS

## 2019-05-10 MED ORDER — BELLADONNA ALKALOIDS-OPIUM 16.2-60 MG RE SUPP
RECTAL | Status: AC
  Filled 2019-05-10: qty 1

## 2019-05-10 MED ORDER — PHENYLEPHRINE HCL (PRESSORS) 10 MG/ML IV SOLN
INTRAVENOUS | Status: AC
  Filled 2019-05-10: qty 1

## 2019-05-10 MED ORDER — BELLADONNA ALKALOIDS-OPIUM 16.2-60 MG RE SUPP
RECTAL | Status: DC | PRN
  Administered 2019-05-10: 1 via RECTAL

## 2019-05-10 MED ORDER — ONDANSETRON HCL 4 MG/2ML IJ SOLN
INTRAMUSCULAR | Status: AC
  Filled 2019-05-10: qty 2

## 2019-05-10 MED ORDER — LIDOCAINE HCL (CARDIAC) PF 100 MG/5ML IV SOSY
PREFILLED_SYRINGE | INTRAVENOUS | Status: DC | PRN
  Administered 2019-05-10: 80 mg via INTRAVENOUS

## 2019-05-10 MED ORDER — LACTATED RINGERS IV SOLN: INTRAVENOUS | Status: DC

## 2019-05-10 MED ORDER — ROCURONIUM BROMIDE 100 MG/10ML IV SOLN
INTRAVENOUS | Status: DC | PRN
  Administered 2019-05-10 (×3): 10 mg via INTRAVENOUS
  Administered 2019-05-10: 30 mg via INTRAVENOUS

## 2019-05-10 MED ORDER — ONDANSETRON HCL 4 MG/2ML IJ SOLN: 4.0000 mg | Freq: Once | INTRAMUSCULAR | Status: DC | PRN

## 2019-05-10 MED ORDER — LIDOCAINE HCL (PF) 2 % IJ SOLN
INTRAMUSCULAR | Status: AC
  Filled 2019-05-10: qty 5

## 2019-05-10 MED ORDER — FENTANYL CITRATE (PF) 100 MCG/2ML IJ SOLN: 25.0000 ug | INTRAMUSCULAR | Status: DC | PRN

## 2019-05-10 MED ORDER — EPHEDRINE 5 MG/ML INJ
INTRAVENOUS | Status: AC
  Filled 2019-05-10: qty 10

## 2019-05-10 MED ORDER — SUGAMMADEX SODIUM 200 MG/2ML IV SOLN
INTRAVENOUS | Status: DC | PRN
  Administered 2019-05-10: 200 mg via INTRAVENOUS

## 2019-05-10 MED ORDER — SUCCINYLCHOLINE CHLORIDE 20 MG/ML IJ SOLN
INTRAMUSCULAR | Status: DC | PRN
  Administered 2019-05-10: 100 mg via INTRAVENOUS

## 2019-05-10 MED ORDER — DEXAMETHASONE SODIUM PHOSPHATE 10 MG/ML IJ SOLN
INTRAMUSCULAR | Status: AC
  Filled 2019-05-10: qty 1

## 2019-05-10 MED ORDER — FENTANYL CITRATE (PF) 100 MCG/2ML IJ SOLN
INTRAMUSCULAR | Status: DC | PRN
  Administered 2019-05-10: 25 ug via INTRAVENOUS
  Administered 2019-05-10: 50 ug via INTRAVENOUS
  Administered 2019-05-10: 25 ug via INTRAVENOUS

## 2019-05-10 MED ORDER — PROPOFOL 10 MG/ML IV BOLUS
INTRAVENOUS | Status: DC | PRN
  Administered 2019-05-10: 150 mg via INTRAVENOUS

## 2019-05-10 MED ORDER — ROCURONIUM BROMIDE 10 MG/ML (PF) SYRINGE
PREFILLED_SYRINGE | INTRAVENOUS | Status: AC
  Filled 2019-05-10: qty 10

## 2019-05-10 MED ORDER — SEVOFLURANE IN SOLN
RESPIRATORY_TRACT | Status: AC
  Filled 2019-05-10: qty 250

## 2019-05-10 MED ORDER — EPHEDRINE SULFATE 50 MG/ML IJ SOLN
INTRAMUSCULAR | Status: DC | PRN
  Administered 2019-05-10 (×2): 10 mg via INTRAVENOUS

## 2019-05-10 MED ORDER — PHENYLEPHRINE HCL (PRESSORS) 10 MG/ML IV SOLN
INTRAVENOUS | Status: DC | PRN
  Administered 2019-05-10 (×5): 100 ug via INTRAVENOUS

## 2019-05-10 MED ORDER — HYDROCODONE-ACETAMINOPHEN 5-325 MG PO TABS: 1.0000 | ORAL_TABLET | ORAL | 0 refills | Status: DC | PRN

## 2019-05-10 MED ORDER — FENTANYL CITRATE (PF) 100 MCG/2ML IJ SOLN
INTRAMUSCULAR | Status: AC
  Filled 2019-05-10: qty 2

## 2019-05-10 MED ORDER — DEXAMETHASONE SODIUM PHOSPHATE 10 MG/ML IJ SOLN
INTRAMUSCULAR | Status: DC | PRN
  Administered 2019-05-10: 8 mg via INTRAVENOUS

## 2019-05-10 SURGICAL SUPPLY — 32 items
ADAPTER IRRIG TUBE 2 SPIKE SOL (ADAPTER) ×6 IMPLANT
BAG URO DRAIN 4000ML (MISCELLANEOUS) ×3 IMPLANT
CATH FOLEY 3WAY 30CC 24FR (CATHETERS) ×2
CATH URETL 5X70 OPEN END (CATHETERS) ×3 IMPLANT
CATH URTH STD 24FR FL 3W 2 (CATHETERS) ×1 IMPLANT
CONTAINER COLLECT MORCELLATR (MISCELLANEOUS) ×1 IMPLANT
DRAPE UTILITY 15X26 TOWEL STRL (DRAPES) IMPLANT
ELECT BIVAP BIPO 22/24 DONUT (ELECTROSURGICAL)
ELECTRD BIVAP BIPO 22/24 DONUT (ELECTROSURGICAL) IMPLANT
FILTER OVERFLOW MORCELLATOR (FILTER) ×1 IMPLANT
GLOVE BIOGEL PI IND STRL 7.5 (GLOVE) ×1 IMPLANT
GLOVE BIOGEL PI INDICATOR 7.5 (GLOVE) ×4
GOWN STRL REUS W/ TWL LRG LVL3 (GOWN DISPOSABLE) ×1 IMPLANT
GOWN STRL REUS W/ TWL XL LVL3 (GOWN DISPOSABLE) ×1 IMPLANT
GOWN STRL REUS W/TWL LRG LVL3 (GOWN DISPOSABLE) ×2
GOWN STRL REUS W/TWL XL LVL3 (GOWN DISPOSABLE) ×6
HOLDER FOLEY CATH W/STRAP (MISCELLANEOUS) ×3 IMPLANT
KIT TURNOVER CYSTO (KITS) ×3 IMPLANT
LASER FIBER 550M SMARTSCOPE (Laser) ×3 IMPLANT
MORCELLATOR COLLECT CONTAINER (MISCELLANEOUS) ×3
MORCELLATOR OVERFLOW FILTER (FILTER) ×3
MORCELLATOR ROTATION 4.75 335 (MISCELLANEOUS) ×3 IMPLANT
PACK CYSTO AR (MISCELLANEOUS) ×3 IMPLANT
SET CYSTO W/LG BORE CLAMP LF (SET/KITS/TRAYS/PACK) ×3 IMPLANT
SET IRRIG Y TYPE TUR BLADDER L (SET/KITS/TRAYS/PACK) ×3 IMPLANT
SLEEVE PROTECTION STRL DISP (MISCELLANEOUS) ×6 IMPLANT
SOL .9 NS 3000ML IRR  AL (IV SOLUTION) ×20
SOL .9 NS 3000ML IRR UROMATIC (IV SOLUTION) ×4 IMPLANT
SURGILUBE 2OZ TUBE FLIPTOP (MISCELLANEOUS) ×3 IMPLANT
SYRINGE IRR TOOMEY STRL 70CC (SYRINGE) ×3 IMPLANT
TUBE PUMP MORCELLATOR PIRANHA (TUBING) ×3 IMPLANT
WATER STERILE IRR 1000ML POUR (IV SOLUTION) ×3 IMPLANT

## 2019-05-10 NOTE — Anesthesia Preprocedure Evaluation (Addendum)
Anesthesia Evaluation  Patient identified by MRN, date of birth, ID band Patient awake    Reviewed: Allergy & Precautions, H&P , NPO status , Patient's Chart, lab work & pertinent test results  History of Anesthesia Complications (+) history of anesthetic complications ("difficulty putting breathing tube in at [facility], I had a sore throat for a while." No airway records available for review, pt does not carry a difficult airway card. Denies h/o neck radiation/surgery)  Airway Mallampati: III  TM Distance: <3 FB Neck ROM: full  Mouth opening: Limited Mouth Opening Comment: Somewhat small mouth opening TM 2-3 FB  Dental  (+) Partial Lower   Pulmonary shortness of breath and with exertion, COPD,  COPD inhaler, former smoker,    breath sounds clear to auscultation       Cardiovascular hypertension, (-) angina(-) Past MI and (-) Cardiac Stents (-) dysrhythmias  Rhythm:regular Rate:Normal     Neuro/Psych PSYCHIATRIC DISORDERS Anxiety Depression negative neurological ROS     GI/Hepatic Neg liver ROS, GERD  ,  Endo/Other  negative endocrine ROS  Renal/GU      Musculoskeletal   Abdominal   Peds  Hematology negative hematology ROS (+)   Anesthesia Other Findings Past Medical History: No date: Anxiety No date: Back pain No date: BPH (benign prostatic hyperplasia) No date: Cancer (HCC)     Comment:  BASAL CELL No date: Chest pain, non-cardiac No date: Colon polyps No date: Complication of anesthesia     Comment:  anesthesia stays a long time No date: COPD (chronic obstructive pulmonary disease) (HCC) No date: Depression No date: Essential tremor No date: Essential tremor No date: Family history of adverse reaction to anesthesia     Comment:  DAUGHTERS GET NAUSEATED No date: GERD (gastroesophageal reflux disease)     Comment:  H/O No date: Helicobacter pylori (H. pylori) infection No date: Hyperlipidemia No date:  Neuromuscular disorder ([place])     Comment:  TREMORS No date: Reactive airway disease No date: Reactive airway disease No date: Restless leg syndrome  Past Surgical History: 2013: COLONOSCOPY 03/21/2016: COLONOSCOPY WITH PROPOFOL; N/A     Comment:  Procedure: COLONOSCOPY WITH PROPOFOL;  Surgeon: Manya Silvas, MD;  Location: [facility] ENDOSCOPY;  Service:               Endoscopy;  Laterality: N/A; 03/09/2018: COLONOSCOPY WITH PROPOFOL; N/A     Comment:  Procedure: COLONOSCOPY WITH PROPOFOL;  Surgeon: Manya Silvas, MD;  Location: [facility] ENDOSCOPY;  Service:               Endoscopy;  Laterality: N/A; No date: ESOPHAGOGASTRODUODENOSCOPY 03/09/2018: ESOPHAGOGASTRODUODENOSCOPY; N/A     Comment:  Procedure: ESOPHAGOGASTRODUODENOSCOPY (EGD);  Surgeon:               Manya Silvas, MD;  Location: [facility] ENDOSCOPY;                Service: Endoscopy;  Laterality: N/A; No date: HERNIA REPAIR 08/01/2014: INGUINAL HERNIA REPAIR; Right     Comment:  Procedure: RIGHT INGUINAL HERNIA  REPAIR WITH MESH ;                Surgeon: Robert Bellow, MD;  Location: [facility] ORS;                Service: General;  Laterality: Right; with large Ultra  Pro mesh No date: NASAL SINUS SURGERY No date: ROTATOR CUFF REPAIR No date: TRIGGER FINGER RELEASE     Reproductive/Obstetrics negative OB ROS                            Anesthesia Physical Anesthesia Plan  ASA: II  Anesthesia Plan: General ETT   Post-op Pain Management:    Induction:   PONV Risk Score and Plan: Ondansetron, Dexamethasone and Treatment may vary due to age or medical condition  Airway Management Planned: Video Laryngoscope Planned  Additional Equipment:   Intra-op Plan:   Post-operative Plan:   Informed Consent: I have reviewed the patients History and Physical, chart, labs and discussed the procedure including the risks, benefits and alternatives for the proposed  anesthesia with the patient or authorized representative who has indicated his/her understanding and acceptance.     Dental Advisory Given  Plan Discussed with: Anesthesiologist, CRNA and Surgeon  Anesthesia Plan Comments: (Plan glidescope with smaller size ETT )       Anesthesia Quick Evaluation

## 2019-05-10 NOTE — Anesthesia Procedure Notes (Signed)
Procedure Name: Intubation Date/Time: 05/10/2019 7:36 AM Performed by: Lerry Liner, CRNA Pre-anesthesia Checklist: Patient identified, Emergency Drugs available, Suction available, Patient being monitored and Timeout performed Patient Re-evaluated:Patient Re-evaluated prior to induction Oxygen Delivery Method: Circle system utilized Preoxygenation: Pre-oxygenation with 100% oxygen Induction Type: IV induction Ventilation: Mask ventilation without difficulty Laryngoscope Size: Glidescope and 3 Grade View: Grade I Tube type: Oral Tube size: 6.0 mm Number of attempts: 1 Airway Equipment and Method: Rigid stylet Placement Confirmation: ETT inserted through vocal cords under direct vision,  positive ETCO2 and breath sounds checked- equal and bilateral Secured at: 21 cm Tube secured with: Tape Dental Injury: Teeth and Oropharynx as per pre-operative assessment

## 2019-05-10 NOTE — Transfer of Care (Signed)
Immediate Anesthesia Transfer of Care Note  Patient: Wesley Preston  Procedure(s) Performed: HOLEP-LASER ENUCLEATION OF THE PROSTATE WITH MORCELLATION (N/A )  Patient Location: PACU  Anesthesia Type:General  Level of Consciousness: sedated and patient cooperative  Airway & Oxygen Therapy: Patient Spontanous Breathing  Post-op Assessment: Report given to RN  Post vital signs: stable  Last Vitals:  Vitals Value Taken Time  BP 149/81 05/10/19 0908  Temp 36.1 C 05/10/19 0908  Pulse 68 05/10/19 0909  Resp 14 05/10/19 0909  SpO2 100 % 05/10/19 0909  Vitals shown include unvalidated device data.  Last Pain:  Vitals:   05/10/19 0631  TempSrc: Temporal  PainSc: 0-No pain         Complications: No apparent anesthesia complications

## 2019-05-10 NOTE — Progress Notes (Signed)
Secure chat to Dr. Diamantina Providence as pt inquires when he may resume ibuprofen. Per MD, pt should wait 48 hours before resuming. Pt verbalized understanding; written on discharge instructions

## 2019-05-10 NOTE — Discharge Instructions (Addendum)
Indwelling Urinary Catheter Care, Adult An indwelling urinary catheter is a thin tube that is put into your bladder. The tube helps to drain pee (urine) out of your body. The tube goes in through your urethra. Your urethra is where pee comes out of your body. Your pee will come out through the catheter, then it will go into a bag (drainage bag). Take good care of your catheter so it will work well. How to wear your catheter and bag Supplies needed  Sticky tape (adhesive tape) or a leg strap.  Alcohol wipe or soap and water (if you use tape).  A clean towel (if you use tape).  Large overnight bag.  Smaller bag (leg bag). Wearing your catheter Attach your catheter to your leg with tape or a leg strap.  Make sure the catheter is not pulled tight.  If a leg strap gets wet, take it off and put on a dry strap.  If you use tape to hold the bag on your leg: 1. Use an alcohol wipe or soap and water to wash your skin where the tape made it sticky before. 2. Use a clean towel to pat-dry that skin. 3. Use new tape to make the bag stay on your leg. Wearing your bags You should have been given a large overnight bag.  You may wear the overnight bag in the day or night.  Always have the overnight bag lower than your bladder.  Do not let the bag touch the floor.  Before you go to sleep, put a clean plastic bag in a wastebasket. Then hang the overnight bag inside the wastebasket. You should also have a smaller leg bag that fits under your clothes.  Always wear the leg bag below your knee.  Do not wear your leg bag at night. How to care for your skin and catheter Supplies needed  A clean washcloth.  Water and mild soap.  A clean towel. Caring for your skin and catheter      Clean the skin around your catheter every day: 1. Wash your hands with soap and water. 2. Wet a clean washcloth in warm water and mild soap. 3. Clean the skin around your urethra.  If you are  male:  Gently spread the folds of skin around your vagina (labia).  With the washcloth in your other hand, wipe the inner side of your labia on each side. Wipe from front to back.  If you are male:  Pull back any skin that covers the end of your penis (foreskin).  With the washcloth in your other hand, wipe your penis in small circles. Start wiping at the tip of your penis, then move away from the catheter.  Move the foreskin back in place, if needed. 4. With your free hand, hold the catheter close to where it goes into your body.  Keep holding the catheter during cleaning so it does not get pulled out. 5. With the washcloth in your other hand, clean the catheter.  Only wipe downward on the catheter.  Do not wipe upward toward your body. Doing this may push germs into your urethra and cause infection. 6. Use a clean towel to pat-dry the catheter and the skin around it. Make sure to wipe off all soap. 7. Wash your hands with soap and water.  Shower every day. Do not take baths.  Do not use cream, ointment, or lotion on the area where the catheter goes into your body, unless your doctor tells you   to.  Do not use powders, sprays, or lotions on your genital area.  Check your skin around the catheter every day for signs of infection. Check for: ? Redness, swelling, or pain. ? Fluid or blood. ? Warmth. ? Pus or a bad smell. How to empty the bag Supplies needed  Rubbing alcohol.  Gauze pad or cotton ball.  Tape or a leg strap. Emptying the bag Pour the pee out of your bag when it is ?- full, or at least 2-3 times a day. Do this for your overnight bag and your leg bag. 1. Wash your hands with soap and water. 2. Separate (detach) the bag from your leg. 3. Hold the bag over the toilet or a clean pail. Keep the bag lower than your hips and bladder. This is so the pee (urine) does not go back into the tube. 4. Open the pour spout. It is at the bottom of the bag. 5. Empty the  pee into the toilet or pail. Do not let the pour spout touch any surface. 6. Put rubbing alcohol on a gauze pad or cotton ball. 7. Use the gauze pad or cotton ball to clean the pour spout. 8. Close the pour spout. 9. Attach the bag to your leg with tape or a leg strap. 10. Wash your hands with soap and water. Follow instructions for cleaning the drainage bag:  From the product maker.  As told by your doctor. How to change the bag Supplies needed  Alcohol wipes.  A clean bag.  Tape or a leg strap. Changing the bag Replace your bag when it starts to leak, smell bad, or look dirty. 1. Wash your hands with soap and water. 2. Separate the dirty bag from your leg. 3. Pinch the catheter with your fingers so that pee does not spill out. 4. Separate the catheter tube from the bag tube where these tubes connect (at the connection valve). Do not let the tubes touch any surface. 5. Clean the end of the catheter tube with an alcohol wipe. Use a different alcohol wipe to clean the end of the bag tube. 6. Connect the catheter tube to the tube of the clean bag. 7. Attach the clean bag to your leg with tape or a leg strap. Do not make the bag tight on your leg. 8. Wash your hands with soap and water. General rules   Never pull on your catheter. Never try to take it out. Doing that can hurt you.  Always wash your hands before and after you touch your catheter or bag. Use a mild, fragrance-free soap. If you do not have soap and water, use hand sanitizer.  Always make sure there are no twists or bends (kinks) in the catheter tube.  Always make sure there are no leaks in the catheter or bag.  Drink enough fluid to keep your pee pale yellow.  Do not take baths, swim, or use a hot tub.  If you are male, wipe from front to back after you poop (have a bowel movement). Contact a doctor if:  Your pee is cloudy.  Your pee smells worse than usual.  Your catheter gets clogged.  Your catheter  leaks.  Your bladder feels full. Get help right away if:  You have redness, swelling, or pain where the catheter goes into your body.  You have fluid, blood, pus, or a bad smell coming from the area where the catheter goes into your body.  Your skin feels warm where   where the catheter goes into your body.  You have a fever.  You have pain in your: ? Belly (abdomen). ? Legs. ? Lower back. ? Bladder.  You see blood in the catheter.  Your pee is pink or red.  You feel sick to your stomach (nauseous).  You throw up (vomit).  You have chills.  Your pee is not draining into the bag.  Your catheter gets pulled out. Summary  An indwelling urinary catheter is a thin tube that is placed into the bladder to help drain pee (urine) out of the body.  The catheter is placed into the part of the body that drains pee from the bladder (urethra).  Taking good care of your catheter will keep it working properly and help prevent problems.  Always wash your hands before and after touching your catheter or bag.  Never pull on your catheter or try to take it out. This information is not intended to replace advice given to you by your health care provider. Make sure you discuss any questions you have with your health care provider. Document Revised: 04/13/2018 Document Reviewed: 08/05/2016 Elsevier Patient Education  2020 [employer]     AMBULATORY SURGERY  DISCHARGE INSTRUCTIONS   1) The drugs that you were given will stay in your system until tomorrow so for the next 24 hours you should not:  A) Drive an automobile B) Make any legal decisions C) Drink any alcoholic beverage   2) You may resume regular meals tomorrow.  Today it is better to start with liquids and gradually work up to solid foods.  You may eat anything you prefer, but it is better to start with liquids, then soup and crackers, and gradually work up to solid foods.   3) Please notify your doctor immediately if  you have any unusual bleeding, trouble breathing, redness and pain at the surgery site, drainage, fever, or pain not relieved by medication.    4) Additional Instructions:        Please contact your physician with any problems or Same Day Surgery at [phone], Monday through Friday 6 am to 4 pm, or [place] [facility] [place] Main number at [phone]. 

## 2019-05-10 NOTE — Anesthesia Postprocedure Evaluation (Signed)
Anesthesia Post Note  Patient: Wesley Preston  Procedure(s) Performed: HOLEP-LASER ENUCLEATION OF THE PROSTATE WITH MORCELLATION (N/A )  Patient location during evaluation: PACU Anesthesia Type: General Level of consciousness: awake and alert Pain management: pain level controlled Vital Signs Assessment: post-procedure vital signs reviewed and stable Respiratory status: spontaneous breathing, nonlabored ventilation and respiratory function stable Cardiovascular status: blood pressure returned to baseline and stable Postop Assessment: no apparent nausea or vomiting Anesthetic complications: no     Last Vitals:  Vitals:   05/10/19 1012 05/10/19 1049  BP: (!) 149/79 (!) 146/78  Pulse: 64 63  Resp: 16 16  Temp: (!) 36.2 C   SpO2: 97% 97%    Last Pain:  Vitals:   05/10/19 1049  TempSrc:   PainSc: 0-No pain                 Tera Mater

## 2019-05-10 NOTE — H&P (Signed)
05/10/19 7:06 AM   [name] 1940/11/18 [id number]  CC: BPH  HPI: Mr. Wesley Preston is a 79 year old male with a long history of BPH and voiding symptoms, elevated PVRs greater than 200 mL, and UTIs.  After extensive work-up he is opted for an outlet procedure with HOLEP.  Prostate measured 45 g.  PSA was normal at 1 in December 2020.  He denies any fevers, chills, chest pain, shortness of breath.   PMH: Past Medical History:  Diagnosis Date  . Anxiety   . Back pain   . BPH (benign prostatic hyperplasia)   . Cancer (HCC)    BASAL CELL  . Chest pain, non-cardiac   . Colon polyps   . Complication of anesthesia    anesthesia stays a long time  . COPD (chronic obstructive pulmonary disease) ([place])   . Depression   . Essential tremor   . Essential tremor   . Family history of adverse reaction to anesthesia    DAUGHTERS GET NAUSEATED  . GERD (gastroesophageal reflux disease)    H/O  . Helicobacter pylori (H. pylori) infection   . Hyperlipidemia   . Neuromuscular disorder ([place])    TREMORS  . Reactive airway disease   . Reactive airway disease   . Restless leg syndrome     Surgical History: Past Surgical History:  Procedure Laterality Date  . COLONOSCOPY  2013  . COLONOSCOPY WITH PROPOFOL N/A 03/21/2016   Procedure: COLONOSCOPY WITH PROPOFOL;  Surgeon: Manya Silvas, MD;  Location: [facility] ENDOSCOPY;  Service: Endoscopy;  Laterality: N/A;  . COLONOSCOPY WITH PROPOFOL N/A 03/09/2018   Procedure: COLONOSCOPY WITH PROPOFOL;  Surgeon: Manya Silvas, MD;  Location: [facility] ENDOSCOPY;  Service: Endoscopy;  Laterality: N/A;  . ESOPHAGOGASTRODUODENOSCOPY    . ESOPHAGOGASTRODUODENOSCOPY N/A 03/09/2018   Procedure: ESOPHAGOGASTRODUODENOSCOPY (EGD);  Surgeon: Manya Silvas, MD;  Location: [facility] ENDOSCOPY;  Service: Endoscopy;  Laterality: N/A;  . HERNIA REPAIR    . INGUINAL HERNIA REPAIR Right 08/01/2014   Procedure: RIGHT INGUINAL HERNIA  REPAIR WITH MESH ;  Surgeon: Robert Bellow, MD;  Location: [facility] ORS;  Service: General;  Laterality: Right; with large Ultra Pro mesh  . NASAL SINUS SURGERY    . ROTATOR CUFF REPAIR    . TRIGGER FINGER RELEASE     Family History: Family History  Problem Relation Age of Onset  . Heart disease Father     Social History:  reports that he quit smoking about 31 years ago. His smoking use included cigarettes. He has a 20.00 pack-year smoking history. He has never used smokeless tobacco. He reports current alcohol use of about 24.0 standard drinks of alcohol per week. He reports that he does not use drugs.  Physical Exam: BP (!) 151/100   Pulse 71   Temp 97.6 F (36.4 C) (Temporal)   Resp 12   SpO2 96%    Constitutional:  Alert and oriented, No acute distress. Cardiovascular: RRR Respiratory: CTA bilaterally GI: Abdomen is soft, nontender, nondistended, no abdominal masses  Laboratory Data: Urinalysis 4/26 negative  Assessment & Plan:   In summary, is a 79 year old male with a long history of incomplete bladder emptying and PVR greater than 200 mL, UTI, and bothersome urinary symptoms.  When he stops his doxazosin his urinary symptoms acutely worsen.  I had a long conversation with the patient and his wife today about his urinary symptoms and findings today on cystoscopy and transrectal ultrasound and treatment options.  With his elevated PVRs  and symptoms that worsen when stopping doxazosin, I do feel he would benefit from an outlet procedure.  We discussed the risks and benefits of HoLEP at length.  The procedure requires general anesthesia and takes 2 to 3 hours, and a holmium laser is used to enucleate the prostate and push this tissue into the bladder.  A morcellator is then used to remove this tissue, which is sent for pathology.  The vast majority of patients are able to discharge the same day with a catheter in place for 2 to 3 days, and will follow-up in clinic for a voiding trial.  Approximately 5% of patients will  be admitted overnight to monitor the urine, or if they have multiple co-morbidities.  We specifically discussed the risks of bleeding, infection, retrograde ejaculation, temporary urgency and urge incontinence, very low risk of long-term incontinence, pathologic evaluation of prostate tissue and possible detection of prostate cancer or other malignancy, and possible need for additional procedures.  Nickolas Madrid, MD 05/10/2019  [facility] [address], [place], [place] [address] [phone]

## 2019-05-10 NOTE — Op Note (Signed)
Date of procedure: 05/10/19  Preoperative diagnosis:  1. BPH with incomplete bladder emptying  Postoperative diagnosis:  1. Same  Procedure: 1. HoLEP (Holmium Laser Enucleation of the Prostate)  Surgeon: Nickolas Madrid, MD  Anesthesia: General  Complications: None  Intraoperative findings:  1. Diffusely narrow urethra requiring dilation with Leander Rams sounds 2.  Short prostate with high bladder neck 3.  Good hemostasis, ureteral orifices and verumontanum intact at conclusion of case  EBL: 25 mL  Specimens: Prostate chips  Enucleation time: 39 minutes  Morcellation time: 3 minutes  Intra-op weight: 25 g  Drains: 24 French three-way, 60 cc in balloon  Indication: Wesley Preston is a 79 y.o. patient with long history of BPH and history of UTIs and elevated PVRs greater than 200 mL.  He opted for an outlet procedure for management.  After reviewing the management options for treatment, they elected to proceed with the above surgical procedure(s). We have discussed the potential benefits and risks of the procedure, side effects of the proposed treatment, the likelihood of the patient achieving the goals of the procedure, and any potential problems that might occur during the procedure or recuperation.  We specifically discussed the risks of bleeding, infection, hematuria and clot retention, need for additional procedures, possible overnight hospital stay, temporary urgency and incontinence, rare long-term incontinence, and retrograde ejaculation.  Informed consent has been obtained.   Description of procedure:  The patient was taken to the operating room and general anesthesia was induced.  The patient was placed in the dorsal lithotomy position, prepped and draped in the usual sterile fashion, and preoperative antibiotics were administered.  SCDs were placed for DVT prophylaxis.  A preoperative time-out was performed.   The urethra was diffusely narrow and [employer] sounds were used  to dilate the urethra up to 30 [place].  The 8 French continuous flow resectoscope was inserted into the urethra using the visual obturator  The prostate was moderate in size with a high bladder neck. The bladder was thoroughly inspected and notable for mild bladder trabeculations but no suspicious lesions.  The ureteral orifices were located in orthotopic position.  The laser was set to 2 J and 50 Hz and was used to make an incision at the 6 o'clock position to the level of the capsule from the bladder neck to the verumontanum.  The lateral lobes were then incised circumferentially until they were disconnected from the surrounding tissue.  The capsule was examined and laser was used for meticulous hemostasis.    The 76 French resectoscope was then switched out for the 42 French nephroscope and the lobes were morcellated and the tissue sent to pathology.  A 24 French three-way catheter was inserted easily, and CBI was initiated.  30 cc were placed in the balloon.  Urine was initially clear but then turned dark to cherry.  An additional 30 cc were placed in the balloon and urine cleared rapidly to faint pink to clear.  The catheter irrigated easily with a Toomey syringe with no clots.  A belladonna suppository was placed.  The patient tolerated the procedure well without any immediate complications and was extubated and transferred to the recovery room in stable condition.  Urine was clear on fast CBI.  Disposition: Stable to PACU  Plan: Wean CBI in PACU, anticipate discharge home today with void trial in clinic in 2-3 days  Nickolas Madrid, MD 05/10/2019

## 2019-05-13 ENCOUNTER — Ambulatory Visit (INDEPENDENT_AMBULATORY_CARE_PROVIDER_SITE_OTHER): Payer: Medicare Other | Admitting: Physician Assistant

## 2019-05-13 ENCOUNTER — Ambulatory Visit: Payer: Medicare Other | Admitting: Physician Assistant

## 2019-05-13 ENCOUNTER — Other Ambulatory Visit: Payer: Self-pay

## 2019-05-13 DIAGNOSIS — N401 Enlarged prostate with lower urinary tract symptoms: Secondary | ICD-10-CM

## 2019-05-13 DIAGNOSIS — R339 Retention of urine, unspecified: Secondary | ICD-10-CM

## 2019-05-13 DIAGNOSIS — N138 Other obstructive and reflux uropathy: Secondary | ICD-10-CM

## 2019-05-13 LAB — BLADDER SCAN AMB NON-IMAGING: Scan Result: 93

## 2019-05-13 LAB — SURGICAL PATHOLOGY

## 2019-05-13 NOTE — Patient Instructions (Signed)

## 2019-05-13 NOTE — Progress Notes (Signed)
Voiding trial afternoon follow-up  Patient return to clinic this afternoon for afternoon follow-up.  He has been able to urinate and has had some urinary leakage.  He denies dysuria and gross hematuria.  PVR 93 mL.  Counseled patient to start Kegel exercises 3x10 sets daily and provided verbal and written instructions on how to complete these exercises.  We will plan for 6-week postop follow-up with Dr. Diamantina Providence.  He expressed understanding.  Results for orders placed or performed in visit on 05/13/19  BLADDER SCAN AMB NON-IMAGING  Result Value Ref Range   Scan Result 93 ML

## 2019-05-13 NOTE — Progress Notes (Signed)
Fill and Pull Catheter Removal  Patient is present today for a catheter removal.  Patient was cleaned and prepped in a sterile fashion 350 ml of sterile water/ saline was instilled into the bladder when the patient felt the urge to urinate. 50 ml of water was then drained from the balloon.  A 24FR foley cath was removed from the bladder moderate blood noted.  Patient as then given some time to void on their own.  Patient can void  180 ml on their own after some time.  Patient tolerated well.  Performed by: Kerman Passey, RMA.  Follow up/ Additional notes: Patient will come back this afternoon

## 2019-05-14 ENCOUNTER — Telehealth: Payer: Self-pay

## 2019-05-14 NOTE — Telephone Encounter (Signed)
Patient notified

## 2019-05-14 NOTE — Telephone Encounter (Signed)
-----   Message from Billey Co, MD sent at 05/13/2019  6:46 PM EDT ----- Kermit Balo news, pathology from holep showed only benign prostate tissue, no prostate cancer. Follow up as scheduled  Nickolas Madrid, MD 05/13/2019

## 2019-05-17 ENCOUNTER — Other Ambulatory Visit (INDEPENDENT_AMBULATORY_CARE_PROVIDER_SITE_OTHER): Payer: Medicare Other

## 2019-05-17 ENCOUNTER — Other Ambulatory Visit: Payer: Self-pay

## 2019-05-17 DIAGNOSIS — N390 Urinary tract infection, site not specified: Secondary | ICD-10-CM

## 2019-05-17 MED ORDER — SULFAMETHOXAZOLE-TRIMETHOPRIM 800-160 MG PO TABS: 1.0000 | ORAL_TABLET | Freq: Two times a day (BID) | ORAL | 0 refills | Status: AC

## 2019-05-17 NOTE — Progress Notes (Signed)
Patient called clinic this morning with reports of headache, body ache, and "flulike" symptoms.  Given HOLEP 7 days ago, I asked him to drop off a urine sample today for UA to rule out urinary infection.  UA notable for nitrites and moderate bacteria today; pyuria and microscopic hematuria consistent with recent urologic surgery versus infection.  Will start patient on empiric Bactrim DS twice daily x7 days and send urine for culture.  Counseled patient to follow-up with his PCP to report his flulike symptoms in case they wish to initiate Covid testing.  Patient has been fully vaccinated against Covid for approximately 2 months. He expressed understanding.

## 2019-05-18 LAB — URINALYSIS, COMPLETE
Bilirubin, UA: NEGATIVE
Glucose, UA: NEGATIVE
Ketones, UA: NEGATIVE
Nitrite, UA: POSITIVE — AB
Specific Gravity, UA: 1.02 (ref 1.005–1.030)
Urobilinogen, Ur: 1 mg/dL (ref 0.2–1.0)
pH, UA: 6 (ref 5.0–7.5)

## 2019-05-18 LAB — MICROSCOPIC EXAMINATION
RBC, Urine: >30 /hpf — AB (ref 0–2)
WBC, UA: >30 /hpf — AB (ref 0–5)

## 2019-05-21 ENCOUNTER — Telehealth: Payer: Self-pay

## 2019-05-21 NOTE — Telephone Encounter (Signed)
Incoming call from pt c/o RT leg pain from the knee up. Pt states that there has been no injury to the leg however it is painful, tender to the touch, and has some areas of numbness x 2-3 days. Pt questions if this is urologically related or a possible reaction to his antibiotics. Advised pt that allergic reaction would be unlikely as he has been on the medication for several days, also advised pt on typical allergic reaction signs and symptoms (I.e. hives, rash, itching ect) Pt denies all these. Advised pt that leg pain is not related to his recent urologic procedure. Strongly advised pt to call PCP or seek care in urgent care or ED.

## 2019-05-22 LAB — CULTURE, URINE COMPREHENSIVE

## 2019-05-30 ENCOUNTER — Other Ambulatory Visit: Payer: Self-pay

## 2019-05-30 ENCOUNTER — Encounter: Payer: Self-pay | Admitting: Physician Assistant

## 2019-05-30 ENCOUNTER — Telehealth: Payer: Self-pay

## 2019-05-30 ENCOUNTER — Other Ambulatory Visit: Payer: Self-pay | Admitting: Physician Assistant

## 2019-05-30 ENCOUNTER — Ambulatory Visit (INDEPENDENT_AMBULATORY_CARE_PROVIDER_SITE_OTHER): Payer: Medicare Other | Admitting: Physician Assistant

## 2019-05-30 VITALS — BP 147/79 | HR 72 | Ht 70.0 in | Wt 191.3 lb

## 2019-05-30 DIAGNOSIS — R339 Retention of urine, unspecified: Secondary | ICD-10-CM

## 2019-05-30 DIAGNOSIS — R300 Dysuria: Secondary | ICD-10-CM

## 2019-05-30 LAB — BLADDER SCAN AMB NON-IMAGING: Scan Result: 247

## 2019-05-30 MED ORDER — NITROFURANTOIN MONOHYD MACRO 100 MG PO CAPS: 100.0000 mg | ORAL_CAPSULE | Freq: Two times a day (BID) | ORAL | 0 refills | Status: AC

## 2019-05-30 NOTE — Telephone Encounter (Signed)
Patient called complaining of dysuria and some bleeding. Patient did have HOLEP surgery on 05/10/2019. Pt was on antibiotics on around 05/13/2019 for similar symptoms. Pt will come in for appt

## 2019-05-30 NOTE — Progress Notes (Signed)
05/30/2019 1:56 PM   Wesley Preston 06/10/1940 [id number]  CC: Chief Complaint  Patient presents with  . Dysuria    HPI: Wesley Preston is a 79 y.o. male with PMH BPH with elevated PVRs >200 mL and UTIs s/p HOLEP with Dr. Diamantina Providence on 05/10/2019 who presents today for evaluation of possible UTI.  He did experience some postoperative dysuria following Foley removal; UA notable for nitrites and culture ultimately grew Staph epidermidis; I treated him with Bactrim DS twice daily x7 days.  Today, patient reports a 2-day history of dysuria, urgency, and frequency.  He has had increased urge incontinence secondary to this.  Of note, he urinated 4 times in clinic today. He is no longer taking doxazosin following his recent urologic procedure.  In-office UA today positive for 3+ blood and 1+ leukocyte esterase; urine microscopy with >30 WBCs/HPF and 11-30 RBCs/HPF. PVR 266mL.  PMH: Past Medical History:  Diagnosis Date  . Anxiety   . Back pain   . BPH (benign prostatic hyperplasia)   . Cancer (HCC)    BASAL CELL  . Chest pain, non-cardiac   . Colon polyps   . Complication of anesthesia    anesthesia stays a long time  . COPD (chronic obstructive pulmonary disease) ([place])   . Depression   . Essential tremor   . Essential tremor   . Family history of adverse reaction to anesthesia    DAUGHTERS GET NAUSEATED  . GERD (gastroesophageal reflux disease)    H/O  . Helicobacter pylori (H. pylori) infection   . Hyperlipidemia   . Neuromuscular disorder ([place])    TREMORS  . Reactive airway disease   . Reactive airway disease   . Restless leg syndrome     Surgical History: Past Surgical History:  Procedure Laterality Date  . COLONOSCOPY  2013  . COLONOSCOPY WITH PROPOFOL N/A 03/21/2016   Procedure: COLONOSCOPY WITH PROPOFOL;  Surgeon: Manya Silvas, MD;  Location: [facility] ENDOSCOPY;  Service: Endoscopy;  Laterality: N/A;  . COLONOSCOPY WITH PROPOFOL N/A 03/09/2018   Procedure: COLONOSCOPY  WITH PROPOFOL;  Surgeon: Manya Silvas, MD;  Location: [facility] ENDOSCOPY;  Service: Endoscopy;  Laterality: N/A;  . ESOPHAGOGASTRODUODENOSCOPY    . ESOPHAGOGASTRODUODENOSCOPY N/A 03/09/2018   Procedure: ESOPHAGOGASTRODUODENOSCOPY (EGD);  Surgeon: Manya Silvas, MD;  Location: [facility] ENDOSCOPY;  Service: Endoscopy;  Laterality: N/A;  . HERNIA REPAIR    . HOLEP-LASER ENUCLEATION OF THE PROSTATE WITH MORCELLATION N/A 05/10/2019   Procedure: HOLEP-LASER ENUCLEATION OF THE PROSTATE WITH MORCELLATION;  Surgeon: Billey Co, MD;  Location: [facility] ORS;  Service: Urology;  Laterality: N/A;  . INGUINAL HERNIA REPAIR Right 08/01/2014   Procedure: RIGHT INGUINAL HERNIA  REPAIR WITH MESH ;  Surgeon: Robert Bellow, MD;  Location: [facility] ORS;  Service: General;  Laterality: Right; with large Ultra Pro mesh  . NASAL SINUS SURGERY    . ROTATOR CUFF REPAIR    . TRIGGER FINGER RELEASE      Home Medications:  Allergies as of 05/30/2019      Reactions   Shellfish Allergy Anaphylaxis      Medication List       Accurate as of May 30, 2019  1:56 PM. If you have any questions, ask your nurse or doctor.        Anoro Ellipta 62.5-25 MCG/INH Aepb Generic drug: umeclidinium-vilanterol Inhale 1 puff into the lungs daily.   HYDROcodone-acetaminophen 5-325 MG tablet Commonly known as: NORCO/VICODIN Take 1 tablet by mouth every 4 (  four) hours as needed for moderate pain.   montelukast 10 MG tablet Commonly known as: SINGULAIR Take 10 mg by mouth at bedtime.   Nasacort Allergy 24HR 55 MCG/ACT Aero nasal inhaler Generic drug: triamcinolone Place 2 sprays into the nose daily.   omeprazole 40 MG capsule Commonly known as: PRILOSEC Take 40 mg by mouth daily.   primidone 50 MG tablet Commonly known as: MYSOLINE Take 50 mg by mouth 3 (three) times daily.   propranolol 20 MG tablet Commonly known as: INDERAL Take 20 mg by mouth 3 (three) times daily.       Allergies:  Allergies  Allergen  Reactions  . Shellfish Allergy Anaphylaxis    Family History: Family History  Problem Relation Age of Onset  . Heart disease Father     Social History:   reports that he quit smoking about 31 years ago. His smoking use included cigarettes. He has a 20.00 pack-year smoking history. He has never used smokeless tobacco. He reports current alcohol use of about 24.0 standard drinks of alcohol per week. He reports that he does not use drugs.  Physical Exam: BP (!) 147/79   Pulse 72   Ht 5\' 10"  (1.778 m)   Wt 191 lb 4.8 oz (86.8 kg)   BMI 27.45 kg/m   Constitutional:  Alert and oriented, no acute distress, nontoxic appearing HEENT: [place], AT Cardiovascular: No clubbing, cyanosis, or edema Respiratory: Normal respiratory effort, no increased work of breathing Skin: No rashes, bruises or suspicious lesions Neurologic: Grossly intact, no focal deficits, moving all 4 extremities Psychiatric: Normal mood and affect  Laboratory Data: Results for orders placed or performed in visit on 05/30/19  Microscopic Examination   URINE  Result Value Ref Range   WBC, UA >30 (A) 0 - 5 /hpf   RBC 11-30 (A) 0 - 2 /hpf   Epithelial Cells (non renal) 0-10 0 - 10 /hpf   Bacteria, UA Few None seen/Few  Urinalysis, Complete  Result Value Ref Range   Specific Gravity, UA 1.015 1.005 - 1.030   pH, UA 6.0 5.0 - 7.5   Color, UA Yellow Yellow   Appearance Ur Cloudy (A) Clear   Leukocytes,UA 1+ (A) Negative   Protein,UA Negative Negative/Trace   Glucose, UA Negative Negative   Ketones, UA Negative Negative   RBC, UA 3+ (A) Negative   Bilirubin, UA Negative Negative   Urobilinogen, Ur 0.2 0.2 - 1.0 mg/dL   Nitrite, UA Negative Negative   Microscopic Examination See below:   Bladder Scan (Post Void Residual) in office  Result Value Ref Range   Scan Result 247    Assessment & Plan:   1. Dysuria 79 year old male s/p HOLEP presents with a 2-day history of dysuria, urgency, frequency, and increased UUI.   UA today notable for microscopic hematuria and pyuria, unclear if this represents typical postoperative changes versus acute cystitis.  Will start patient on empiric Macrobid given impending holiday weekend and send urine for culture for further evaluation. - Urinalysis, Complete - Bladder Scan (Post Void Residual) in office - CULTURE, URINE COMPREHENSIVE - nitrofurantoin, macrocrystal-monohydrate, (MACROBID) 100 MG capsule; Take 1 capsule (100 mg total) by mouth every 12 (twelve) hours for 7 days.  Dispense: 14 capsule; Refill: 0   2. Incomplete bladder emptying Unclear if this is secondary to infection versus return to baseline phenomenon.  Recommend patient to restart doxazosin at this time with plans for repeat PVR in 1 week.  If this remains a stable finding, I  suspect is contributing to his recurrent UTIs and he will likely need to remain on this long-term for preventative purposes.  Return in about 1 week (around 06/06/2019) for Symptom recheck with PVR.  Debroah Loop, PA-C  [facility] [address] [place], [place] [address] [phone]

## 2019-05-31 LAB — MICROSCOPIC EXAMINATION: WBC, UA: >30 /hpf — AB (ref 0–5)

## 2019-05-31 LAB — URINALYSIS, COMPLETE
Bilirubin, UA: NEGATIVE
Glucose, UA: NEGATIVE
Ketones, UA: NEGATIVE
Nitrite, UA: NEGATIVE
Protein,UA: NEGATIVE
Specific Gravity, UA: 1.015 (ref 1.005–1.030)
Urobilinogen, Ur: 0.2 mg/dL (ref 0.2–1.0)
pH, UA: 6 (ref 5.0–7.5)

## 2019-06-04 ENCOUNTER — Telehealth: Payer: Self-pay

## 2019-06-04 NOTE — Telephone Encounter (Signed)
We are still awaiting the final results of his urine culture and will be in touch as soon as we receive them.  Is he still symptomatic?

## 2019-06-04 NOTE — Telephone Encounter (Signed)
Please offer him an appointment with me tomorrow for repeat PVR.

## 2019-06-04 NOTE — Telephone Encounter (Signed)
Called pt, he states that he still feels he is unable to empty his bladder completely. His stream is alternating from a weak stream to a dribble. Denies fever, hematuria, or chills. Up ever 1.5 hours to void, worse at night.

## 2019-06-04 NOTE — Telephone Encounter (Signed)
Patients wife called wanting to know about urine culture results. Please advise.

## 2019-06-05 ENCOUNTER — Ambulatory Visit: Payer: Medicare Other | Admitting: Physician Assistant

## 2019-06-05 ENCOUNTER — Other Ambulatory Visit: Payer: Self-pay

## 2019-06-05 VITALS — BP 120/75 | HR 74 | Ht 70.0 in | Wt 193.0 lb

## 2019-06-05 DIAGNOSIS — R339 Retention of urine, unspecified: Secondary | ICD-10-CM

## 2019-06-05 LAB — CULTURE, URINE COMPREHENSIVE

## 2019-06-05 LAB — BLADDER SCAN AMB NON-IMAGING: Scan Result: 243

## 2019-06-05 MED ORDER — SULFAMETHOXAZOLE-TRIMETHOPRIM 800-160 MG PO TABS: 1.0000 | ORAL_TABLET | Freq: Two times a day (BID) | ORAL | 0 refills | Status: AC

## 2019-06-05 NOTE — Patient Instructions (Addendum)
1. Stop Macrobid today and start Bactrim. 2. Start urinating every 2 hours. If you need to urinate sooner than this, that is fine, but do not wait longer than 2 hours between urinating. 3. Dr. Diamantina Providence will see you in clinic in one month.  For your reference, the list of foods that can irritate the bladder includes caffeine, alcohol, carbonated beverages, chocolate, spicy foods, and acidic foods including citrus and tomatoes.

## 2019-06-05 NOTE — Telephone Encounter (Signed)
Called pt informed him of the information below. He elects to be seen today, appt scheduled for 10:30am. Pt voiced understanding.

## 2019-06-05 NOTE — Progress Notes (Signed)
06/05/2019 4:25 PM   Wesley Preston August 17, 1940 [id number]  CC: Chief Complaint  Patient presents with  . Dysuria  . Urinary Urgency  . Urinary Frequency    HPI: Wesley Preston is a 79 y.o. male with PMH BPH with elevated PVRs >240mL and UTIs s/p HOLEP with Dr. Diamantina Providence on 05/10/2019 who presents today for evaluation of persistent dysuria, urgency, and frequency.  I have treated him for 2 Staph epidermidis UTIs since surgery, first with Bactrim DS twice daily x7 days and most recently with Macrobid 100 mg twice daily x7 days which he will complete tomorrow.  Additionally, he has been found to have persistently elevated PVR, 247 mL 1 week ago. I restarted him on doxazosin last week for management of this.  Today, patient reports persistent dysuria, urgency, and frequency despite culture appropriate antibiotics.  Additionally, he feels he is unable to empty his bladder and reports a weak urinary stream.  He has been taking daily doxazosin as instructed.  PVR 239mL.  PMH: Past Medical History:  Diagnosis Date  . Anxiety   . Back pain   . BPH (benign prostatic hyperplasia)   . Cancer (HCC)    BASAL CELL  . Chest pain, non-cardiac   . Colon polyps   . Complication of anesthesia    anesthesia stays a long time  . COPD (chronic obstructive pulmonary disease) ([place])   . Depression   . Essential tremor   . Essential tremor   . Family history of adverse reaction to anesthesia    DAUGHTERS GET NAUSEATED  . GERD (gastroesophageal reflux disease)    H/O  . Helicobacter pylori (H. pylori) infection   . Hyperlipidemia   . Neuromuscular disorder ([place])    TREMORS  . Reactive airway disease   . Reactive airway disease   . Restless leg syndrome     Surgical History: Past Surgical History:  Procedure Laterality Date  . COLONOSCOPY  2013  . COLONOSCOPY WITH PROPOFOL N/A 03/21/2016   Procedure: COLONOSCOPY WITH PROPOFOL;  Surgeon: Manya Silvas, MD;  [facility] END[facility]COPY;  Service:  Endoscopy;  Laterality: N/A;  . COLONOSCOPY WITH PROPOFOL N/A 03/09/2018   Procedure: COLONOSCOPY WITH PROPOFOL;  Surgeon: Manya Silvas, MD;  Location: [facility] ENDOSCOPY;  Service: Endoscopy;  Laterality: N/A;  . ESOPHAGOGASTRODUODENOSCOPY    . ESOPHAGOGASTRODUODENOSCOPY N/A 03/09/2018   Procedure: ESOPHAGOGASTRODUODENOSCOPY (EGD);  Surgeon: Manya Silvas, MD;  Location: [facility] ENDOSCOPY;  Service: Endoscopy;  Laterality: N/A;  . HERNIA REPAIR    . HOLEP-LASER ENUCLEATION OF THE PROSTATE WITH MORCELLATION N/A 05/10/2019   Procedure: HOLEP-LASER ENUCLEATION OF THE PROSTATE WITH MORCELLATION;  Surgeon: Billey Co, MD;  Location: [facility] ORS;  Service: Urology;  Laterality: N/A;  . INGUINAL HERNIA REPAIR Right 08/01/2014   Procedure: RIGHT INGUINAL HERNIA  REPAIR WITH MESH ;  Surgeon: Robert Bellow, MD;  Location: [facility] ORS;  Service: General;  Laterality: Right; with large Ultra Pro mesh  . NASAL SINUS SURGERY    . ROTATOR CUFF REPAIR    . TRIGGER FINGER RELEASE      Home Medications:  Allergies as of 06/05/2019      Reactions   Shellfish Allergy Anaphylaxis      Medication List       Accurate as of June 05, 2019  4:25 PM. If you have any questions, ask your nurse or doctor.        Anoro Ellipta 62.5-25 MCG/INH Aepb Generic drug: umeclidinium-vilanterol Inhale 1 puff into  the lungs daily.   HYDROcodone-acetaminophen 5-325 MG tablet Commonly known as: NORCO/VICODIN Take 1 tablet by mouth every 4 (four) hours as needed for moderate pain.   montelukast 10 MG tablet Commonly known as: SINGULAIR Take 10 mg by mouth at bedtime.   Nasacort Allergy 24HR 55 MCG/ACT Aero nasal inhaler Generic drug: triamcinolone Place 2 sprays into the nose daily.   nitrofurantoin (macrocrystal-monohydrate) 100 MG capsule Commonly known as: MACROBID Take 1 capsule (100 mg total) by mouth every 12 (twelve) hours for 7 days.   omeprazole 40 MG capsule Commonly known as: PRILOSEC Take 40 mg by  mouth daily.   primidone 50 MG tablet Commonly known as: MYSOLINE Take 50 mg by mouth 3 (three) times daily.   propranolol 20 MG tablet Commonly known as: INDERAL Take 20 mg by mouth 3 (three) times daily.   sulfamethoxazole-trimethoprim 800-160 MG tablet Commonly known as: BACTRIM DS Take 1 tablet by mouth 2 (two) times daily for 14 days. Started by: Debroah Loop, PA-C       Allergies:  Allergies  Allergen Reactions  . Shellfish Allergy Anaphylaxis    Family History: Family History  Problem Relation Age of Onset  . Heart disease Father     Social History:   reports that he quit smoking about 31 years ago. His smoking use included cigarettes. He has a 20.00 pack-year smoking history. He has never used smokeless tobacco. He reports current alcohol use of about 24.0 standard drinks of alcohol per week. He reports that he does not use drugs.  Physical Exam: BP 120/75   Pulse 74   Ht 5\' 10"  (1.778 m)   Wt 193 lb (87.5 kg)   BMI 27.69 kg/m   Constitutional:  Alert and oriented, no acute distress, nontoxic appearing HEENT: [place], AT Cardiovascular: No clubbing, cyanosis, or edema Respiratory: Normal respiratory effort, no increased work of breathing Skin: No rashes, bruises or suspicious lesions Neurologic: Grossly intact, no focal deficits, moving all 4 extremities Psychiatric: Normal mood and affect  Laboratory Data: Results for orders placed or performed in visit on 06/05/19  BLADDER SCAN AMB NON-IMAGING  Result Value Ref Range   Scan Result 243 ML    Assessment & Plan:   1. Incomplete bladder emptying 79 year old male with PMH BPH with incomplete bladder emptying s/p HOLEP 1 month ago with 2 Staph epi-positive urine cultures following surgery and persistent dysuria, urgency, and frequency despite 2 courses of culture appropriate antibiotics and stably elevated PVR despite restarting doxazosin.  Differential at this time includes persistent versus recurrent  UTI versus post HOLEP bladder overactivity.  He is a poor candidate for OAB meds at this time given his elevated PVR.  I discussed management of this patient with Dr. Diamantina Providence, who recommended a 2-week course of tissue penetrating antibiotics, timed voiding, and close follow-up in 1 month.  Starting patient on Bactrim DS twice daily x14 days.  Counseled patient to continue doxazosin and not go longer than 2 hours without urinating.  Additionally, I reviewed the list of bladder irritants, including caffeine, alcohol, carbonated beverages, chocolate, spicy foods, and acidic food and advised him to avoid these. - BLADDER SCAN AMB NON-IMAGING - sulfamethoxazole-trimethoprim (BACTRIM DS) 800-160 MG tablet; Take 1 tablet by mouth 2 (two) times daily for 14 days.  Dispense: 28 tablet; Refill: 0   Return in about 4 weeks (around 07/03/2019) for Postop follow-up with Dr. Diamantina Providence (already scheduled).  Debroah Loop, PA-C  [facility] [address] [place],  [place] [address] [phone]

## 2019-06-06 ENCOUNTER — Ambulatory Visit: Payer: Self-pay | Admitting: Physician Assistant

## 2019-06-13 ENCOUNTER — Telehealth: Payer: Self-pay | Admitting: *Deleted

## 2019-06-13 LAB — URINALYSIS, COMPLETE
Bilirubin, UA: NEGATIVE
Glucose, UA: NEGATIVE
Ketones, UA: NEGATIVE
Nitrite, UA: NEGATIVE
Protein,UA: NEGATIVE
Specific Gravity, UA: 1.015 (ref 1.005–1.030)
Urobilinogen, Ur: 0.2 mg/dL (ref 0.2–1.0)
pH, UA: 6 (ref 5.0–7.5)

## 2019-06-13 LAB — MICROSCOPIC EXAMINATION: WBC, UA: >30 /hpf — AB (ref 0–5)

## 2019-06-13 NOTE — Telephone Encounter (Signed)
Patient called regarding leakage and dribbling. He is concerned with the amount he is still having daily. He is taking doxazosin, which has not helped. Would like to know if he should take anything else?

## 2019-06-17 ENCOUNTER — Telehealth: Payer: Self-pay

## 2019-06-17 ENCOUNTER — Encounter: Payer: Self-pay | Admitting: Urology

## 2019-06-17 ENCOUNTER — Ambulatory Visit: Payer: Medicare Other | Admitting: Urology

## 2019-06-17 ENCOUNTER — Telehealth: Payer: Self-pay | Admitting: Urology

## 2019-06-17 ENCOUNTER — Other Ambulatory Visit: Payer: Self-pay

## 2019-06-17 ENCOUNTER — Other Ambulatory Visit
Admission: RE | Admit: 2019-06-17 | Discharge: 2019-06-17 | Disposition: A | Discharge status: Home / Self Care | Payer: Medicare Other | Attending: Physician Assistant | Admitting: Physician Assistant

## 2019-06-17 VITALS — BP 128/72 | HR 81 | Ht 69.0 in | Wt 188.0 lb

## 2019-06-17 DIAGNOSIS — R339 Retention of urine, unspecified: Secondary | ICD-10-CM | POA: Diagnosis not present

## 2019-06-17 DIAGNOSIS — N390 Urinary tract infection, site not specified: Secondary | ICD-10-CM

## 2019-06-17 DIAGNOSIS — R300 Dysuria: Secondary | ICD-10-CM | POA: Diagnosis not present

## 2019-06-17 LAB — URINALYSIS, COMPLETE (UACMP) WITH MICROSCOPIC
Bilirubin Urine: NEGATIVE
Glucose, UA: NEGATIVE mg/dL
Ketones, ur: NEGATIVE mg/dL
Nitrite: NEGATIVE
Protein, ur: 100 mg/dL — AB
Specific Gravity, Urine: 1.025 (ref 1.005–1.030)
WBC, UA: >50 WBC/hpf (ref 0–5)
pH: 5.5 (ref 5.0–8.0)

## 2019-06-17 NOTE — Telephone Encounter (Signed)
Please let Mr. Wesley Preston know that his urine looked as expected with micro heme.  How is he voiding today?

## 2019-06-17 NOTE — Progress Notes (Signed)
06/17/2019 4:53 PM   Wesley Preston [birth year] [id number]  Referring provider: Idelle Crouch, MD [facility] [place],  [place] [address]  Chief Complaint  Patient presents with  . Acute Visit    straining to urinate, urgency    HPI: Wesley Preston is a 79 year old male with a history of epididymitis and BPH with incomplete emptying who is status post HoLEP with Dr. Diamantina Providence on 05/10/2019 who presents today requesting an urgent appointment for straining to urinate and urgency.  He was found to have a diffusely narrow urethra which required dilation with Leander Rams sounds during the HoLEP procedure.  Pathology was negative for prostate cancer.  He stated he had a good urinary flow a week after the procedure, then then he started to experience dysuria, urgency and frequency.    He was seen and evaluated by Sam and was treated for a Staph epidermidis with culture appropriate antibiotics.  He was also restarted on his doxazosin.  He then returned with the same symptoms and was noted to have a PVR of 247 mL and grossly infected urine.  He again grew out Staph epidermidis and has been on a course of Septra DS which he will finish in 2 days.  Today, he states his urinary stream is reduced to just a dribble.  He feels he is not emptying his bladder.  He is also having the frequency, urgency, dysuria and incontinence.  He wearing depends at this time.  Patient denies any modifying or aggravating factors.  Patient denies any dysuria or suprapubic/flank pain.  Patient denies any fevers, chills, nausea or vomiting.   CATH UA yellow hazy, moderate blood, 100 protein, moderate leukocytes, 0-5 squames, > 50 WBC's, 21-50 RBC's, few bacteria and mucus present.  PVR 350 cc.   PMH: Past Medical History:  Diagnosis Date  . Anxiety   . Back pain   . BPH (benign prostatic hyperplasia)   . Cancer (HCC)    BASAL CELL  . Chest pain, non-cardiac   . Colon polyps   . Complication  of anesthesia    anesthesia stays a long time  . COPD (chronic obstructive pulmonary disease) ([place])   . Depression   . Essential tremor   . Essential tremor   . Family history of adverse reaction to anesthesia    DAUGHTERS GET NAUSEATED  . GERD (gastroesophageal reflux disease)    H/O  . Helicobacter pylori (H. pylori) infection   . Hyperlipidemia   . Neuromuscular disorder ([place])    TREMORS  . Reactive airway disease   . Reactive airway disease   . Restless leg syndrome     Surgical History: Past Surgical History:  Procedure Laterality Date  . COLONOSCOPY  2013  . COLONOSCOPY WITH PROPOFOL N/A 03/21/2016   Procedure: COLONOSCOPY WITH PROPOFOL;  Surgeon: Manya Silvas, MD;  Location: [facility] ENDOSCOPY;  Service: Endoscopy;  Laterality: N/A;  . COLONOSCOPY WITH PROPOFOL N/A 03/09/2018   Procedure: COLONOSCOPY WITH PROPOFOL;  Surgeon: Manya Silvas, MD;  Location: [facility] ENDOSCOPY;  Service: Endoscopy;  Laterality: N/A;  . ESOPHAGOGASTRODUODENOSCOPY    . ESOPHAGOGASTRODUODENOSCOPY N/A 03/09/2018   Procedure: ESOPHAGOGASTRODUODENOSCOPY (EGD);  Surgeon: Manya Silvas, MD;  Location[facility] ENDOSCOPY;  Service: Endoscopy;  Laterality: N/A;  . HERNIA REPAIR    . HOLEP-LASER ENUCLEATION OF THE PROSTATE WITH MORCELLATION N/A 05/10/2019   Procedure: HOLEP-LASER ENUCLEATION OF THE PROSTATE WITH MORCELLATION;  Surgeon: Billey Co, MD;  Location: [facility]  ORS;  Service: Urology;  Laterality: N/A;  . INGUINAL HERNIA REPAIR Right 08/01/2014   Procedure: RIGHT INGUINAL HERNIA  REPAIR WITH MESH ;  Surgeon: Robert Bellow, MD;  Location: [facility] ORS;  Service: General;  Laterality: Right; with large Ultra Pro mesh  . NASAL SINUS SURGERY    . ROTATOR CUFF REPAIR    . TRIGGER FINGER RELEASE      Home Medications:  Allergies as of 06/17/2019      Reactions   Shellfish Allergy Anaphylaxis      Medication List       Accurate as of June 17, 2019  4:53 PM. If you have any questions, ask your  nurse or doctor.        Anoro Ellipta 62.5-25 MCG/INH Aepb Generic drug: umeclidinium-vilanterol Inhale 1 puff into the lungs daily.   HYDROcodone-acetaminophen 5-325 MG tablet Commonly known as: NORCO/VICODIN Take 1 tablet by mouth every 4 (four) hours as needed for moderate pain.   montelukast 10 MG tablet Commonly known as: SINGULAIR Take 10 mg by mouth at bedtime.   Nasacort Allergy 24HR 55 MCG/ACT Aero nasal inhaler Generic drug: triamcinolone Place 2 sprays into the nose daily.   omeprazole 40 MG capsule Commonly known as: PRILOSEC Take 40 mg by mouth daily.   primidone 50 MG tablet Commonly known as: MYSOLINE Take 50 mg by mouth 3 (three) times daily.   propranolol 20 MG tablet Commonly known as: INDERAL Take 20 mg by mouth 3 (three) times daily.   sulfamethoxazole-trimethoprim 800-160 MG tablet Commonly known as: BACTRIM DS Take 1 tablet by mouth 2 (two) times daily for 14 days.       Allergies:  Allergies  Allergen Reactions  . Shellfish Allergy Anaphylaxis    Family History: Family History  Problem Relation Age of Onset  . Heart disease Father     Social History:  reports that he quit smoking about 31 years ago. His smoking use included cigarettes. He has a 20.00 pack-year smoking history. He has never used smokeless tobacco. He reports current alcohol use of about 24.0 standard drinks of alcohol per week. He reports that he does not use drugs.  ROS: Pertinent ROS in HPI  Physical Exam: BP 128/72   Pulse 81   Ht 5\' 9"  (1.753 m)   Wt 188 lb (85.3 kg)   BMI 27.76 kg/m   Constitutional:  Well nourished. Alert and oriented, No acute distress. HEENT: [place] AT, mask in place.  Trachea midline Cardiovascular: No clubbing, cyanosis, or edema. Respiratory: Normal respiratory effort, no increased work of breathing. GI: Abdomen is soft, non tender, non distended, no abdominal masses.  GU: No CVA tenderness.  No bladder fullness or masses.  Patient with  circumcised phallus.   Urethral meatus is patent.  No penile discharge. No penile lesions or rashes. Scrotum without lesions, cysts, rashes and/or edema. Neurologic: Grossly intact, no focal deficits, moving all 4 extremities. Psychiatric: Normal mood and affect.  Laboratory Data: Lab Results  Component Value Date   WBC 6.4 05/06/2019   HGB 15.0 05/06/2019   HCT 45.7 05/06/2019   MCV 92.9 05/06/2019   PLT 227 05/06/2019    Lab Results  Component Value Date   CREATININE 0.70 05/06/2019   Urinalysis Component     Latest Ref Rng & Units 06/17/2019          Color, Urine     YELLOW YELLOW  Appearance     CLEAR HAZY (A)  Specific Gravity, Urine  1.005 - 1.030 1.025  pH     5.0 - 8.0 5.5  Glucose, UA     NEGATIVE mg/dL NEGATIVE  Hgb urine dipstick     NEGATIVE MODERATE (A)  Bilirubin Urine     NEGATIVE NEGATIVE  Ketones, ur     NEGATIVE mg/dL NEGATIVE  Protein     NEGATIVE mg/dL 100 (A)  Nitrite     NEGATIVE NEGATIVE  Leukocytes,Ua     NEGATIVE MODERATE (A)  Squamous Epithelial / LPF     0 - 5 0-5  WBC, UA     0 - 5 WBC/hpf >50  RBC / HPF     0 - 5 RBC/hpf 21-50  Bacteria, UA     NONE SEEN FEW (A)  Mucus      PRESENT  I have reviewed the labs.   Pertinent Imaging: Results for Wesley Preston, Wesley Preston (MRN [id number]) as of 06/17/2019 15:37  Ref. Range 06/05/2019 10:56  Scan Result Unknown 243 ML   In and Out Catheterization Patient is present today for a I & O catheterization due to rUTI's and incomplete bladder emptying. Patient was cleaned and prepped in a sterile fashion with betadine . A 14 FR cath was inserted complications were noted as: increased resistant at the meatus and bulbar urethra but able to pass the catheter, 350 ml of urine return was noted, urine was yellow in color. A clean urine sample was collected for UA and urine culture. Bladder was drained  And catheter was removed with out difficulty.    Performed by: Nori Riis, PA-C  Assessment &  Plan:    1. BPH with incomplete bladder emptying Patient with a recent HoLEP procedure with findings of diffusely narrowed urethra requiring dilation with Leander Rams sounds during the procedure  CATH UA today likely helped with breaking up of stricture formation and re dilation of the strictures  If he continues to have issues, he may benefit from self cathing on a weekly basis to keep the urinary tract open Keep follow up with Dr. Diamantina Providence on 07/09/2019  2. Dysuria Patient with UTI positive for staph epidermis x 2 Question skin contaminate, CATH UA sent for culture today Instructed to complete the Septra as prescribed and will wait on culture results prior to prescribing further antibiotics - Urinalysis, Complete w Microscopic; Future - Urine Culture; Future  Return for Keep follow up with Dr. Diamantina Providence on 07/09/2019 .  These notes generated with voice recognition software. I apologize for typographical errors.  Zara Council, PA-C  [facility] [address]  [place], [place] [address] [phone]

## 2019-06-17 NOTE — Telephone Encounter (Signed)
Patient called today stating that last week he started having trouble urinating, straining with urination, urgency and some dysuria. Patient was added on to PA schedule for further evaluation

## 2019-06-18 ENCOUNTER — Ambulatory Visit: Payer: Medicare Other | Admitting: Urology

## 2019-06-18 ENCOUNTER — Encounter: Payer: Self-pay | Admitting: Urology

## 2019-06-18 VITALS — BP 126/69 | HR 76 | Ht 69.0 in | Wt 190.0 lb

## 2019-06-18 DIAGNOSIS — R339 Retention of urine, unspecified: Secondary | ICD-10-CM | POA: Diagnosis not present

## 2019-06-18 DIAGNOSIS — N99115 Postprocedural fossa navicularis urethral stricture: Secondary | ICD-10-CM

## 2019-06-18 LAB — URINE CULTURE: Culture: NO GROWTH

## 2019-06-18 LAB — BLADDER SCAN AMB NON-IMAGING: Scan Result: 333

## 2019-06-18 NOTE — Progress Notes (Signed)
   06/18/2019 11:44 AM   Wesley Preston 1940-12-16 [id number]  Reason for visit: Follow up urinary symptoms after HOLEP  HPI: I saw Wesley Preston in urology clinic today for persistent urinary symptoms after HOLEP on 05/10/2019.  He had diffuse narrowing of the urethra at the time of surgery that required dilation with Leander Rams sounds, and 43 g of prostate tissue were removed.  Pre-op, he was having obstructive urinary symptoms and elevated PVRs greater than 250 mL.  His postop course has been complicated by staph epidermidis UTI x2, treated with culture appropriate antibiotics, as well as some persistent weak stream and dribbling.  Yesterday, he underwent straight cath with Zara Council, PA and she felt there was some resistance at the meatus and proximally as well.  She was able to drain 300 mL of yellow urine.  He reports his urination was much improved for 12 hours after catheterization, however this morning his urination returned to a dribbling weak stream.  In clinic today bladder scan 340 mL.  He was prepped and draped in standard sterile fashion and lighted jet injected into the meatus.  There appeared to be a fossa navicularis stricture that I was able to dilate with the Urojet.  A 16 French Coloplast catheter was gently placed and there was some mild to moderate resistance distally, but the catheter then passed easily into the bladder with no resistance proximally.  There was return of clear yellow urine in 10 cc were placed in the balloon.  Prior to undergoing HOLEP, he did have some narrowing of the urethra, but he appears to be developing fossa navicularis stricture.  This was dilated in clinic today.  -Follow-up with PA Thursday morning for voiding trial, recommend self urethral dilations few times per week if patient amenable to prevent recurrence -If recurrence of symptoms, recommended OR for cystoscopy and [place], MD  [facility] [address] [place], [place] [address] [phone]

## 2019-06-18 NOTE — Telephone Encounter (Signed)
Notified patient as instructed,  Patient states he is having problems with urination. He states he is only dribbling.

## 2019-06-19 ENCOUNTER — Telehealth: Payer: Self-pay | Admitting: Family Medicine

## 2019-06-19 NOTE — Telephone Encounter (Signed)
-----   Message from Nori Riis, PA-C sent at 06/18/2019 10:56 PM EDT ----- Please let Mr. Pinney know that his urine culture was negative.

## 2019-06-19 NOTE — Telephone Encounter (Signed)
Patient notified and voiced understanding.

## 2019-06-20 ENCOUNTER — Encounter: Payer: Self-pay | Admitting: Physician Assistant

## 2019-06-20 ENCOUNTER — Ambulatory Visit (INDEPENDENT_AMBULATORY_CARE_PROVIDER_SITE_OTHER): Payer: Medicare Other | Admitting: Physician Assistant

## 2019-06-20 ENCOUNTER — Other Ambulatory Visit: Payer: Self-pay

## 2019-06-20 ENCOUNTER — Ambulatory Visit: Payer: Medicare Other | Admitting: Physician Assistant

## 2019-06-20 VITALS — BP 134/75 | HR 76 | Ht 69.0 in | Wt 190.0 lb

## 2019-06-20 DIAGNOSIS — N99115 Postprocedural fossa navicularis urethral stricture: Secondary | ICD-10-CM

## 2019-06-20 NOTE — Patient Instructions (Signed)

## 2019-06-20 NOTE — Progress Notes (Signed)
Fill and Pull Catheter Removal  Patient is present today for a catheter removal.  Patient was cleaned and prepped in a sterile fashion 18ml of sterile water/ saline was instilled into the bladder when the patient felt the urge to urinate. 55ml of water was then drained from the balloon.  A 16 silicone coudeFR foley cath was removed from the bladder complications were noted as: patient had bladder spasm during instillation, leakage was noted .  Patient as then given some time to void on their own.  Patient can void  116ml on their own after some time.  Patient tolerated well.  Performed by: Edwin Dada, CMA, Fonnie Jarvis, CMA  Follow up/ Additional notes: return on 07/09/2019   Continuous Intermittent Catheterization  Due to urethral stricture  patient is present today for a teaching of self I & O Catheterization. Patient was given detailed verbal and printed instructions of self catheterization. Patient was cleaned and prepped in a sterile fashion.  With instruction and assistance patient inserted a 16 flex coude FR and urine return was noted 6ml ml, urine was clear  in color. Patient tolerated well, no complications were noted Patient was given a sample bag with supplies to take home.  Instructions were given per Debroah Loop, [facility] for patient to cath once daily for a week and then once a day every Mon, Wed, Friday.  An order was placed with [employer] for catheters to be sent to the patient's home. Patient is to follow up with Dr. Diamantina Providence next month  Preformed by: Fonnie Jarvis, CMA

## 2019-07-04 ENCOUNTER — Other Ambulatory Visit: Payer: Self-pay | Admitting: *Deleted

## 2019-07-04 DIAGNOSIS — N138 Other obstructive and reflux uropathy: Secondary | ICD-10-CM

## 2019-07-09 ENCOUNTER — Other Ambulatory Visit: Payer: Self-pay

## 2019-07-09 ENCOUNTER — Ambulatory Visit: Payer: Medicare Other | Admitting: Urology

## 2019-07-09 ENCOUNTER — Encounter: Payer: Self-pay | Admitting: Urology

## 2019-07-09 ENCOUNTER — Other Ambulatory Visit
Admission: RE | Admit: 2019-07-09 | Discharge: 2019-07-09 | Disposition: A | Discharge status: Home / Self Care | Payer: Medicare Other | Attending: Urology | Admitting: Urology

## 2019-07-09 VITALS — BP 150/80 | HR 77 | Ht 69.0 in | Wt 188.0 lb

## 2019-07-09 DIAGNOSIS — N401 Enlarged prostate with lower urinary tract symptoms: Secondary | ICD-10-CM | POA: Insufficient documentation

## 2019-07-09 DIAGNOSIS — N138 Other obstructive and reflux uropathy: Secondary | ICD-10-CM

## 2019-07-09 DIAGNOSIS — N99115 Postprocedural fossa navicularis urethral stricture: Secondary | ICD-10-CM

## 2019-07-09 LAB — URINALYSIS, COMPLETE (UACMP) WITH MICROSCOPIC
Bilirubin Urine: NEGATIVE
Glucose, UA: NEGATIVE mg/dL
Ketones, ur: NEGATIVE mg/dL
Nitrite: NEGATIVE
Protein, ur: NEGATIVE mg/dL
RBC / HPF: NONE SEEN RBC/hpf (ref 0–5)
Specific Gravity, Urine: 1.02 (ref 1.005–1.030)
Squamous Epithelial / HPF: NONE SEEN (ref 0–5)
WBC, UA: >50 WBC/hpf (ref 0–5)
pH: 6 (ref 5.0–8.0)

## 2019-07-09 LAB — BLADDER SCAN AMB NON-IMAGING

## 2019-07-09 NOTE — Patient Instructions (Signed)
Pass the catheter into the penis just a few inches every day or every other day in the shower to prevent scar tissue. You do not have to pass it all the way into the bladder. OK to wash catheter with soap and water before using and re-use a catheter for ~ 1 month before changing.  Minimize fluids after 7pm, and urinate twice before bed. OK to try melatonin over the counter to help with sleep.  Melatonin oral solid dosage forms What is this medicine? MELATONIN (mel uh TOH nin) is a dietary supplement. It is mostly promoted to help maintain normal sleep patterns. The FDA has not approved this supplement for any medical use. This supplement may be used for other purposes; ask your health care provider or pharmacist if you have questions. This medicine may be used for other purposes; ask your health care provider or pharmacist if you have questions. COMMON BRAND NAME(S): Melatonex What should I tell my health care provider before I take this medicine? They need to know if you have any of these conditions:  cancer  depression or mental illness  diabetes  hormone problems  if you often drink alcohol  immune system problems  liver disease  lung or breathing disease, like asthma  organ transplant  seizure disorder  an unusual or allergic reaction to melatonin, other medicines, foods, dyes, or preservatives  pregnant or trying to get pregnant  breast-feeding How should I use this medicine? Take this supplement by mouth with a glass of water. Do not take with food. This supplement is usually taken 1 or 2 hours before bedtime. After taking this supplement, limit your activities to those needed to prepare for bed. Some products may be chewed or dissolved in the mouth before swallowing. Some tablets or capsules must be swallowed whole; do not cut, crush or chew. Follow the directions on the package labeling, or take as directed by your health care professional. Do not take this supplement  more often than directed. Talk to your pediatrician regarding the use of this supplement in children. Special care may be needed. This supplement is not recommended for use in children without a prescription. Overdosage: If you think you have taken too much of this medicine contact a poison control center or emergency room at once. NOTE: This medicine is only for you. Do not share this medicine with others. What if I miss a dose? If you miss taking your dose at the usual time, skip that dose. If it is almost time for your next dose, take only that dose. Do not take double or extra doses. What may interact with this medicine? Do not take this medicine with any of the following medications:  fluvoxamine  ramelteon  tasimelteon This medicine may also interact with the following medications:  alcohol  caffeine  carbamazepine  certain antibiotics like ciprofloxacin  certain medicines for depression, anxiety, or psychotic disturbances  cimetidine  male hormones, like estrogens and birth control pills, patches, rings, or injections  methoxsalen  nifedipine  other medications for sleep  other herbal or dietary supplements  phenobarbital  rifampin  smoking tobacco  tamoxifen  warfarin This list may not describe all possible interactions. Give your health care provider a list of all the medicines, herbs, non-prescription drugs, or dietary supplements you use. Also tell them if you smoke, drink alcohol, or use illegal drugs. Some items may interact with your medicine. What should I watch for while using this medicine? See your doctor if your symptoms  do not get better or if they get worse. Do not take this supplement for more than 2 weeks unless your doctor tells you to. You may get drowsy or dizzy. Do not drive, use machinery, or do anything that needs mental alertness until you know how this medicine affects you. Do not stand or sit up quickly, especially if you are an  older patient. This reduces the risk of dizzy or fainting spells. Alcohol may interfere with the effect of this medicine. Avoid alcoholic drinks. After taking this medicine, you may get up out of bed and do an activity that you do not know you are doing. The next morning, you may have no memory of this. Activities include driving a car ("sleep-driving"), making and eating food, talking on the phone, sexual activity, and sleep-walking. Serious injuries have occurred. Call your doctor right away if you find out you have done any of these activities. Do not take this medicine if you have used alcohol that evening. Do not take it if you have taken another medicine for sleep. The risk of doing these sleep-related activities is higher. Talk to your doctor before you use this supplement if you are currently being treated for an emotional, mental, or sleep problem. This medicine may interfere with your treatment. Herbal or dietary supplements are not regulated like medicines. Rigid quality control standards are not required for dietary supplements. The purity and strength of these products can vary. The safety and effect of this dietary supplement for a certain disease or illness is not well known. This product is not intended to diagnose, treat, cure or prevent any disease. The Food and Drug Administration suggests the following to help consumers protect themselves:  Always read product labels and follow directions.  Natural does not mean a product is safe for humans to take.  Look for products that include USP after the ingredient name. This means that the manufacturer followed the standards of the [place] Pharmacopoeia.  Supplements made or sold by a nationally known food or drug company are more likely to be made under tight controls. You can write to the company for more information about how the product was made. What side effects may I notice from receiving this medicine? Side effects that you should report  to your doctor or health care professional as soon as possible:  allergic reactions like skin rash, itching or hives, swelling of the face, lips, or tongue  breathing problems  confusion  depressed mood, irritable, or other changes in moods or behaviors  feeling faint or lightheaded, falls  increased blood pressure  irregular or missed menstrual periods  signs and symptoms of liver injury like dark yellow or brown urine; general ill feeling or flu-like symptoms; light-colored stools; loss of appetite; nausea; right upper belly pain; unusually weak or tired; yellowing of the eyes or skin  trouble staying awake or alert during the day  unusual activities while you are still asleep like driving, eating, making phone calls  unusual bleeding or bruising Side effects that usually do not require medical attention (report to your doctor or health care professional if they continue or are bothersome):  dizziness  drowsiness  headache  hot flashes  nausea  tiredness  unusual dreams or nightmares  upset stomach This list may not describe all possible side effects. Call your doctor for medical advice about side effects. You may report side effects to FDA at 1-800-FDA-1088. Where should I keep my medicine? Keep out of the reach of children.  Store at room temperature or as directed on the package label. Protect from moisture. Throw away any unused supplement after the expiration date. NOTE: This sheet is a summary. It may not cover all possible information. If you have questions about this medicine, talk to your doctor, pharmacist, or health care provider.  2020 Elsevier/Gold Standard (2017-06-16 12:11:58)

## 2019-07-09 NOTE — Progress Notes (Signed)
   07/09/2019 12:41 PM   SAUD BAIL September 06, 1940 [id number]  Reason for visit: Follow up HoLEP  HPI: I saw Mr. Iseman back in urology clinic for follow-up after HOLEP on 05/10/2019 for BPH with incomplete bladder emptying.  He had baseline diffuse narrowing of the urethra that required dilation with Leander Rams sounds, and a short prostate with a high bladder neck.  He developed a fossa navicularis stricture a few weeks postoperatively that required catheterization.  At her most recent visit on 06/18/2019 I dilated a fossa navicularis stricture, and recommended self urethral dilations daily just into the first few inches of the urethra to keep this area open.  He reports he has been voiding with a much stronger stream since dilation on the 15th.  PVR today is normal at 130 mL.  He is having some bothersome nocturia every 1-2 hours overnight, but feels like he does pretty well during the day.  We reviewed behavioral strategies at length regarding minimizing fluids before bed, double voiding prior to bed, and avoiding bladder irritants.  I recommended 76-month follow-up with PVR and symptom check.  If he has recurrence of his fossa navicularis stricture, would recommend cystoscopy and DIVU in the OR.  RTC 3 months PVR   [place], [place] [address] [place], [place] [address] [phone]

## 2019-07-10 ENCOUNTER — Ambulatory Visit: Payer: Self-pay | Admitting: Urology

## 2019-08-23 ENCOUNTER — Telehealth: Payer: Self-pay

## 2019-08-23 NOTE — Telephone Encounter (Signed)
Patient called stating the company he was getting his catheters from no longer takes his insurance and would need to get another catheter company. I filled out another [employer] form. I advised patient if he needs to come in for samples in the mean time he can call and we can provide some until he gets his supply.

## 2019-10-08 ENCOUNTER — Ambulatory Visit: Payer: Medicare Other | Admitting: Urology

## 2019-10-22 ENCOUNTER — Other Ambulatory Visit: Payer: Self-pay

## 2019-10-22 ENCOUNTER — Encounter: Payer: Self-pay | Admitting: Urology

## 2019-10-22 ENCOUNTER — Other Ambulatory Visit
Admission: RE | Admit: 2019-10-22 | Discharge: 2019-10-22 | Disposition: A | Discharge status: Home / Self Care | Payer: Medicare Other | Source: Ambulatory Visit | Attending: Urology | Admitting: Urology

## 2019-10-22 ENCOUNTER — Ambulatory Visit: Payer: Medicare Other | Admitting: Urology

## 2019-10-22 VITALS — BP 172/90 | HR 64 | Ht 70.0 in | Wt 185.0 lb

## 2019-10-22 DIAGNOSIS — N390 Urinary tract infection, site not specified: Secondary | ICD-10-CM

## 2019-10-22 DIAGNOSIS — N401 Enlarged prostate with lower urinary tract symptoms: Secondary | ICD-10-CM

## 2019-10-22 DIAGNOSIS — N138 Other obstructive and reflux uropathy: Secondary | ICD-10-CM

## 2019-10-22 DIAGNOSIS — N99115 Postprocedural fossa navicularis urethral stricture: Secondary | ICD-10-CM | POA: Diagnosis not present

## 2019-10-22 LAB — URINALYSIS, COMPLETE (UACMP) WITH MICROSCOPIC
Bilirubin Urine: NEGATIVE
Glucose, UA: NEGATIVE mg/dL
Hgb urine dipstick: NEGATIVE
Ketones, ur: NEGATIVE mg/dL
Nitrite: POSITIVE — AB
Protein, ur: NEGATIVE mg/dL
Specific Gravity, Urine: 1.025 (ref 1.005–1.030)
pH: 6.5 (ref 5.0–8.0)

## 2019-10-22 LAB — BLADDER SCAN AMB NON-IMAGING

## 2019-10-22 NOTE — Patient Instructions (Signed)
1. Try to urinate every 2-3 hours during the day 2. Avoid fluids within 3 hours of bedtime, and urinate twice before bed 3. Do not catheterize, if you feel uncomfortable and are unable to urinate with pain, ok to catheterize, but please call and let the clinic know 4. We will call you with urine results in the next 24 hours

## 2019-10-22 NOTE — Progress Notes (Signed)
   10/22/2019 2:04 PM   Wesley Preston 12/25/1940 [id number]  Reason for visit: Follow up BPH s/p HoLEP  HPI: I saw Wesley Preston back in urology clinic for follow-up of BPH.  He is an 79 year old male with a history of recurrent UTIs and severe urinary symptoms with incomplete bladder emptying and PVRs greater than 200 mL preop who underwent HOLEP on 05/10/2019 with removal of 43 g of benign prostatic tissue.  He developed a fossa navicularis stricture a few weeks postoperatively, and has been performing self urethral dilations of the distal urethra daily, but sounds like at some point he started performing intermittent catheterization passing the catheter all the way into the bladder for sensation of incomplete emptying.  He is performing this on a daily basis.  He reports ongoing frequency during the day and nocturia.  He denies any incontinence.  He is voiding with a strong stream.  He is not getting any resistance distally when passing the catheter.  He denies any fevers, chills, or dysuria.  PVR is 190 mL today.  Urinalysis today is concerning for infection-nitrite positive, trace leukocytes, few bacteria, 11-20 WBCs, 6-10 RBCs.  I suspect he has a component of atonic bladder, in addition to an active UTI.  I recommended discontinuing catheterizations at this time for his prior fossa navicularis stricture, and start timed voiding every 2-3 hours during the day, minimizing fluids before bed, and double voiding prior to bedtime.  I recommended Bactrim DS twice daily for acute UTI.  We will follow-up urine culture.  Follow-up for virtual visit in 2 to 3 weeks for symptom check, if persistent urinary symptoms at that time, would recommend cystoscopy to evaluate for persistent urethral stricture disease or other pathology.    Billey Co, [facility] [address], [place], [place] [address] [phone]

## 2019-10-23 ENCOUNTER — Telehealth: Payer: Self-pay

## 2019-10-23 DIAGNOSIS — N390 Urinary tract infection, site not specified: Secondary | ICD-10-CM

## 2019-10-23 MED ORDER — SULFAMETHOXAZOLE-TRIMETHOPRIM 800-160 MG PO TABS: 1.0000 | ORAL_TABLET | Freq: Two times a day (BID) | ORAL | 0 refills | Status: AC

## 2019-10-23 NOTE — Telephone Encounter (Signed)
-----   Message from Billey Co, MD sent at 10/22/2019  4:11 PM EDT ----- Looks like UTI, please start Bactrim DS BID x 10 days, will follow up culture results, thanks. This should improve his urinary symptoms  Nickolas Madrid, MD 10/22/2019

## 2019-10-23 NOTE — Telephone Encounter (Signed)
Called pt informed him of the information below. Pt gave verbal understanding. RX sent in.  °

## 2019-10-24 LAB — URINE CULTURE

## 2019-11-12 ENCOUNTER — Telehealth (INDEPENDENT_AMBULATORY_CARE_PROVIDER_SITE_OTHER): Payer: Medicare Other | Admitting: Urology

## 2019-11-12 ENCOUNTER — Other Ambulatory Visit: Payer: Self-pay

## 2019-11-12 DIAGNOSIS — N401 Enlarged prostate with lower urinary tract symptoms: Secondary | ICD-10-CM

## 2019-11-12 DIAGNOSIS — N138 Other obstructive and reflux uropathy: Secondary | ICD-10-CM

## 2019-11-12 DIAGNOSIS — N390 Urinary tract infection, site not specified: Secondary | ICD-10-CM

## 2019-11-12 NOTE — Progress Notes (Signed)
Virtual Visit via Telephone Note  I connected with Wesley Preston on 11/12/19 at 11:00 AM EST by telephone and verified that I am speaking with the correct person using two identifiers.   Patient location: Home Provider location: [facility] Office   I discussed the limitations, risks, security and privacy concerns of performing an evaluation and management service by telephone and the availability of in person appointments. We discussed the impact of the COVID-19 pandemic on the healthcare system, and the importance of social distancing and reducing patient and provider exposure. I also discussed with the patient that there may be a patient responsible charge related to this service. The patient expressed understanding and agreed to proceed.  Reason for visit: BPH  History of Present Illness: I had phone follow-up with Wesley Preston today.  Briefly he is an 79 year old male who had a long history of urinary symptoms including elevated PVRs around 300 mL, nocturia 4-5 times per night, and recurrent UTIs who underwent a HOLEP on 05/10/2019.  He was noted to have a diffusely narrow urethra at the time of surgery.  He developed a fossa navicularis stricture postop that was dilated in clinic, and he performed self dilations for about a month with a catheter.  At some point, he began performing intermittent catheterization for unclear reasons, and developed a UTI.  At our last visit on 10/22/2019 he was having ongoing urinary symptoms of urinary frequency and nocturia.  Urinalysis was concerning for infection with nitrite positive, trace leukocytes, bacteria, 11-20 WBCs, 6-10 RBCs.  Culture ultimately grew multiple species and he was treated with a course of Bactrim.  I also encouraged him to stop catheterizing.  After completing the course of Bactrim, he reports he has been doing very well.  He is voiding with a strong stream, and is performing timed voiding throughout the day, and prior to bed.  He is  having nocturia 2 times per night which is improved significantly from 4-5 times preop.  He denies any dysuria, urgency, or frequency, and is overall doing well.  Follow Up: Continue timed voiding, he may have a component of atonic bladder as well RTC 6 months for IPSS and PVR Consider cystoscopy in the future if recurrence of urinary symptoms   I discussed the assessment and treatment plan with the patient. The patient was provided an opportunity to ask questions and all were answered. The patient agreed with the plan and demonstrated an understanding of the instructions.   The patient was advised to call back or seek an in-person evaluation if the symptoms worsen or if the condition fails to improve as anticipated.  I provided 12 minutes of non-face-to-face time during this encounter.   Billey Co, MD

## 2019-12-19 ENCOUNTER — Other Ambulatory Visit: Payer: Self-pay | Admitting: Internal Medicine

## 2019-12-19 DIAGNOSIS — G250 Essential tremor: Secondary | ICD-10-CM

## 2019-12-19 DIAGNOSIS — R270 Ataxia, unspecified: Secondary | ICD-10-CM

## 2019-12-30 ENCOUNTER — Other Ambulatory Visit: Payer: Self-pay

## 2019-12-30 ENCOUNTER — Ambulatory Visit
Admission: RE | Admit: 2019-12-30 | Discharge: 2019-12-30 | Disposition: A | Discharge status: Home / Self Care | Payer: Medicare Other | Source: Ambulatory Visit | Attending: Internal Medicine | Admitting: Internal Medicine

## 2019-12-30 DIAGNOSIS — R270 Ataxia, unspecified: Secondary | ICD-10-CM | POA: Insufficient documentation

## 2019-12-30 DIAGNOSIS — G250 Essential tremor: Secondary | ICD-10-CM | POA: Diagnosis present

## 2020-05-12 ENCOUNTER — Ambulatory Visit: Payer: Medicare Other | Admitting: Urology

## 2021-08-02 ENCOUNTER — Other Ambulatory Visit: Payer: Self-pay | Admitting: Internal Medicine

## 2021-08-02 DIAGNOSIS — R1310 Dysphagia, unspecified: Secondary | ICD-10-CM

## 2021-08-09 ENCOUNTER — Other Ambulatory Visit: Payer: Medicare Other

## 2021-08-11 ENCOUNTER — Ambulatory Visit
Admission: RE | Admit: 2021-08-11 | Discharge: 2021-08-11 | Disposition: A | Discharge status: Home / Self Care | Payer: Medicare Other | Source: Ambulatory Visit | Attending: Internal Medicine | Admitting: Internal Medicine

## 2021-08-11 DIAGNOSIS — R1310 Dysphagia, unspecified: Secondary | ICD-10-CM | POA: Insufficient documentation

## 2022-07-25 ENCOUNTER — Other Ambulatory Visit: Payer: Self-pay | Admitting: Ophthalmology

## 2022-07-25 DIAGNOSIS — H47291 Other optic atrophy, right eye: Secondary | ICD-10-CM

## 2022-08-02 ENCOUNTER — Ambulatory Visit
Admission: RE | Admit: 2022-08-02 | Discharge: 2022-08-02 | Disposition: A | Discharge status: Home / Self Care | Payer: Medicare Other | Source: Ambulatory Visit | Attending: Ophthalmology | Admitting: Ophthalmology

## 2022-08-02 DIAGNOSIS — H47291 Other optic atrophy, right eye: Secondary | ICD-10-CM | POA: Insufficient documentation

## 2022-08-02 MED ORDER — GADOBUTROL 1 MMOL/ML IV SOLN
7.5000 mL | Freq: Once | INTRAVENOUS | Status: AC | PRN
  Administered 2022-08-02: 7.5 mL via INTRAVENOUS

## 2023-05-12 ENCOUNTER — Encounter: Payer: Self-pay | Admitting: Internal Medicine

## 2023-05-12 ENCOUNTER — Other Ambulatory Visit: Payer: Self-pay | Admitting: Internal Medicine

## 2023-05-12 DIAGNOSIS — R1310 Dysphagia, unspecified: Secondary | ICD-10-CM

## 2023-05-17 ENCOUNTER — Ambulatory Visit
Admission: RE | Admit: 2023-05-17 | Discharge: 2023-05-17 | Disposition: A | Discharge status: Home / Self Care | Source: Ambulatory Visit | Attending: Internal Medicine | Admitting: Internal Medicine

## 2023-05-17 DIAGNOSIS — R1310 Dysphagia, unspecified: Secondary | ICD-10-CM | POA: Insufficient documentation

## 2023-05-24 ENCOUNTER — Other Ambulatory Visit: Payer: Self-pay | Admitting: Internal Medicine

## 2023-05-24 DIAGNOSIS — T17908A Unspecified foreign body in respiratory tract, part unspecified causing other injury, initial encounter: Secondary | ICD-10-CM

## 2023-05-29 NOTE — Therapy (Addendum)
 OUTPATIENT PHYSICAL THERAPY BALANCE EVALUATION   Patient Name: Wesley Preston. MRN: [id number] DOB:04/21/1940, 83 y.o., male Today's Date: 05/31/2023  END OF SESSION:  PT End of Session - 05/31/23 1646     Visit Number 1    Number of Visits 25    Date for PT Re-Evaluation 08/22/23    Authorization Type eval: 05/30/23    PT Start Time 1540    PT Stop Time 1615    PT Time Calculation (min) 35 min    Equipment Utilized During Treatment Gait belt    Activity Tolerance Patient tolerated treatment well    Behavior During Therapy WFL for tasks assessed/performed            Past Medical History:  Diagnosis Date   Anxiety    Back pain    BPH (benign prostatic hyperplasia)    Cancer (HCC)    BASAL CELL   Chest pain, non-cardiac    Colon polyps    Complication of anesthesia    anesthesia stays a long time   COPD (chronic obstructive pulmonary disease) (HCC)    Depression    Essential tremor    Essential tremor    Family history of adverse reaction to anesthesia    DAUGHTERS GET NAUSEATED   GERD (gastroesophageal reflux disease)    H/O   Helicobacter pylori (H. pylori) infection    Hyperlipidemia    Neuromuscular disorder (HCC)    TREMORS   Reactive airway disease    Reactive airway disease    Restless leg syndrome    Past Surgical History:  Procedure Laterality Date   COLONOSCOPY  2013   COLONOSCOPY WITH PROPOFOL  N/A 03/21/2016   Procedure: COLONOSCOPY WITH PROPOFOL ;  Surgeon: Cassie Click, MD;  Location: [facility] ENDOSCOPY;  Service: Endoscopy;  Laterality: N/A;   COLONOSCOPY WITH PROPOFOL  N/A 03/09/2018   Procedure: COLONOSCOPY WITH PROPOFOL ;  Surgeon: Cassie Click, MD;  Location: [facility] ENDOSCOPY;  Service: Endoscopy;  Laterality: N/A;   ESOPHAGOGASTRODUODENOSCOPY     ESOPHAGOGASTRODUODENOSCOPY N/A 03/09/2018   Procedure: ESOPHAGOGASTRODUODENOSCOPY (EGD);  Surgeon: Cassie Click, MD;  Location: [facility] ENDOSCOPY;  Service: Endoscopy;  Laterality: N/A;   HERNIA  REPAIR     HOLEP-LASER ENUCLEATION OF THE PROSTATE WITH MORCELLATION N/A 05/10/2019   Procedure: HOLEP-LASER ENUCLEATION OF THE PROSTATE WITH MORCELLATION;  Surgeon: Lawerence Pressman, MD;  Location: [facility] ORS;  Service: Urology;  Laterality: N/A;   INGUINAL HERNIA REPAIR Right 08/01/2014   Procedure: RIGHT INGUINAL HERNIA  REPAIR WITH MESH ;  Surgeon: Marshall Skeeter, MD;  Location: [facility] ORS;  Service: General;  Laterality: Right; with large Ultra Pro mesh   NASAL SINUS SURGERY     ROTATOR CUFF REPAIR     TRIGGER FINGER RELEASE     Patient Active Problem List   Diagnosis Date Noted   Postprocedural stricture of urethra at fossa navicularis 07/09/2019   BPH with obstruction/lower urinary tract symptoms 08/21/2018   Restless leg syndrome 07/24/2018   Reactive airway disease 07/24/2018   Pure hypercholesterolemia 07/24/2018   Mechanical back pain 07/24/2018   History of Helicobacter pylori infection 07/24/2018   Essential tremor 07/24/2018   Depression 07/24/2018   BPH associated with nocturia 07/24/2018   Benign essential hypertension 07/24/2018   Anxiety 07/24/2018   Trigger finger of left thumb 04/14/2017   Hand pain, left 04/14/2017   Visual disturbance 07/18/2016   Tremor 07/18/2016   Imbalance 07/18/2016   Dizziness 07/18/2016   Right inguinal hernia 07/12/2014  PCP: Yehuda Helms, MD  REFERRING PROVIDER: Yehuda Helms, MD   REFERRING DIAG:  R26.89 (ICD-10-CM) - Other abnormalities of gait and mobility  R42 (ICD-10-CM) - Dizziness and giddiness   RATIONALE FOR EVALUATION AND TREATMENT: Rehabilitation  THERAPY DIAG: Unsteadiness on feet  Muscle weakness (generalized)  ONSET DATE: Multiple years  FOLLOW-UP APPT SCHEDULED WITH REFERRING PROVIDER: Yes, neurology;   SUBJECTIVE:                                                                                                                                                                                          SUBJECTIVE STATEMENT:  Unsteadiness  PERTINENT HISTORY:  Patient with worsening gait and balance. Feels he is "dragging his feet". Infrequently using assistive device. Improved RLS since starting gabapentin. Intermittent episodes of vertigo with position changes.  He goes to [employer] for exercise 1-2x/wk. Ongoing tremors in bilateral hands, worse with activity or stress. He reports 4 falls with the most recent about 1 week ago. Pt reports that he was stepping backwards when he fell. He couldn't get up and had to call extended family to help him from the ground. His wife was not strong enough to assist. He denies any significant injury from the fall. He was referred to PT for sensory ataxia and it was recommended he use a walker as well as perform balance exercise.  08/02/2022 MR BRAIN W WO MR ORBITS W WO IMPRESSION:  1. Atrophic right optic nerve which may reflect sequela of prior  optic neuritis.  2. Otherwise unremarkable appearance of the orbits with no acute  finding.  3. No acute intracranial pathology.  4. Moderate background chronic small-vessel ischemic change.   12/30/2019 MR BRAIN WO IMPRESSION:  No evidence of recent infarction, hemorrhage, or mass. Mild progression of parenchymal volume loss and probable chronic microvascular ischemic changes since 2018. Small chronic left pontine infarcts.   06/10/16 CAROTID ARTERY ULTRASOUND  IMPRESSION: 1. Minimal bilateral carotid bifurcation atherosclerotic vascular plaque. No flow limiting stenosis. Degree of stenosis less than 50% bilaterally.  2. Vertebrals are patent with antegrade flow.  06/02/16  MRI BRAIN WO CONTRAST  IMPRESSION: Atrophy. Small vessel disease. Evidence for chronic brainstem lacunar infarction. No acute intracranial findings. Chronic pansinusitis. Despite prior surgery, significant fluid accumulation and mucosal thickening can be seen, particularly in the RIGHT greater than LEFT frontoethmoid regions.     Pain: No Numbness/Tingling: No, but reports some feet burning; Focal Weakness: No Recent changes in overall health/medication: Yes, adjustment of meds Prior history of physical therapy for balance:  No Dominant hand: right Imaging: Yes  Red flags: Positive for basal cell carcinoma,  Negative for abdominal pain, chills/fever, night sweats, nausea, vomiting, unrelenting pain  PRECAUTIONS: Fall  WEIGHT BEARING RESTRICTIONS: No  FALLS: Has patient fallen in last 6 months? Yes. Number of falls 1, Directional pattern for falls: No  Living Environment Lives with: lives with their spouse Lives in: House/apartment Stairs: one level home, 2 steps to enter from the garage with L rail during ascend.  Has following equipment at home: grab bars in walk-in shower, no seat, rollator (uses periodically)   Prior level of function: Independent  Occupational demands: Retired Pharmacist, hospital  Hobbies: hunting ("I can't get into the stand anymore"), previously like to play golf but he can no longer bend over to put the ball on the tee;  Patient Goals: Pt would like to improve his balance   OBJECTIVE:   Patient Surveys  ABC: To be completed  Cognition Patient is oriented to person, place, and time.  Recent memory is intact.  Remote memory is intact.  Attention span and concentration are intact.  Expressive speech is intact.  Patient's fund of knowledge is within normal limits for educational level.    Gross Musculoskeletal Assessment Tremor: BUE tremors observed, worsened with stress; Bulk: Normal Tone: Normal  Posture: Mild forward head and rounded shoulders  AROM Deferred  LE MMT: Deferred  Sensation Deferred  Reflexes Deferred  Cranial Nerves Deferred  Coordination/Cerebellar Deferred  Bed mobility: Deferred  Transfers: Assistive device utilized: None  Sit to stand: Complete Independence Stand to sit: Complete Independence Chair to chair: Complete  Independence Floor: Not assessed  Curb:  Deferred  Stairs: Level of Assistance: Complete Independence Stair Negotiation Technique: Alternating Pattern  with Bilateral Rails Number of Stairs: 4  Height of Stairs: 6"  Comments: Decreased speed but good stability noted with single or bilateral UE support on rails  Gait: Gait pattern: decreased step length- Right, decreased step length- Left, poor foot clearance- Right, and poor foot clearance- Left Distance walked: 100' Assistive device utilized: None Level of assistance: Complete Independence Comments: Pt ambulates with decreased self-selected speed. Decreased step length bilaterally  mCTSIB: 5.4s  Functional Outcome Measures  Results Comments  BERG 49/56   DGI    FGA    TUG 19.7 seconds   5TSTS 13.0 seconds   6 Minute Walk Test    10 Meter Gait Speed Self-selected: 15.2s = 0.66 m/s; Fastest: 11.8s = 0.85 m/s   (Blank rows = not tested)   TODAY'S TREATMENT  Deferred   PATIENT EDUCATION:  Education details: Plan of care and examination findings Person educated: Patient Education method: Explanation Education comprehension: verbalized understanding   HOME EXERCISE PROGRAM:  Deferred  ASSESSMENT:  CLINICAL IMPRESSION: Patient is a 82 y.o. male who was seen today for physical therapy evaluation and treatment for unsteadiness and LE weakness.   OBJECTIVE IMPAIRMENTS: Abnormal gait, decreased balance, difficulty walking, decreased strength, and dizziness.   ACTIVITY LIMITATIONS: standing, stairs, transfers, and caring for others  PARTICIPATION LIMITATIONS: meal prep, cleaning, laundry, shopping, and community activity  PERSONAL FACTORS: Age, Past/current experiences, Time since onset of injury/illness/exacerbation, and 3+ comorbidities: back pain, tremors, anxiety, depression are also affecting patient's functional outcome.   REHAB POTENTIAL: Good  CLINICAL DECISION MAKING: Unstable/unpredictable  EVALUATION  COMPLEXITY: High   GOALS: Goals reviewed with patient? No  SHORT TERM GOALS: Target date: 07/11/2023  Pt will be independent with HEP in order to improve strength and balance in order to decrease fall risk and improve function at home. Baseline:  Goal status: INITIAL   LONG  TERM GOALS: Target date: 08/22/2023   Pt will improve ABC by at least 13% in order to demonstrate clinically significant improvement in balance confidence.  Baseline: 62.2% Goal status: INITIAL  2.  Pt will improve BERG by at least 3 points in order to demonstrate clinically significant improvement in balance.   Baseline: 49/56 Goal status: INITIAL  3.  Pt will decrease TUG to below 14 seconds/decrease in order to demonstrate decreased fall risk.       Baseline: 19.7s Goal status: INITIAL  4. Pt will increase self-selected 27m gait speed to at least 0.80 m/s in order to demonstrate clinically significant improvement in community ambulation.     Baseline: 0.66 m/s Goal status: INITIAL  PLAN: PT FREQUENCY: 2x/week  PT DURATION: 8 weeks  PLANNED INTERVENTIONS: Therapeutic exercises, Therapeutic activity, Neuromuscular re-education, Balance training, Gait training, Patient/Family education, Self Care, Joint mobilization, Joint manipulation, Vestibular training, Canalith repositioning, Orthotic/Fit training, DME instructions, Dry Needling, Electrical stimulation, Spinal manipulation, Spinal mobilization, Cryotherapy, Moist heat, Taping, Traction, Ultrasound, Ionotophoresis 4mg /ml Dexamethasone , Manual therapy, and Re-evaluation.  PLAN FOR NEXT SESSION: MMT, sensation/reflex testing, cranial nerve screening, coordination testing, DGI, , screen for BPPV, initiate strength/balance exercises, issue HEP;    Sherill Ding Obelia Bonello PT, DPT, GCS  Elzie Knisley 05/31/2023, 4:49 PM

## 2023-05-30 ENCOUNTER — Ambulatory Visit: Attending: Internal Medicine

## 2023-05-30 DIAGNOSIS — M6281 Muscle weakness (generalized): Secondary | ICD-10-CM | POA: Diagnosis present

## 2023-05-30 DIAGNOSIS — R2681 Unsteadiness on feet: Secondary | ICD-10-CM | POA: Insufficient documentation

## 2023-05-31 ENCOUNTER — Ambulatory Visit
Admission: RE | Admit: 2023-05-31 | Discharge: 2023-05-31 | Disposition: A | Discharge status: Home / Self Care | Source: Ambulatory Visit | Attending: Internal Medicine | Admitting: Internal Medicine

## 2023-05-31 DIAGNOSIS — K225 Diverticulum of esophagus, acquired: Secondary | ICD-10-CM | POA: Insufficient documentation

## 2023-05-31 DIAGNOSIS — R1310 Dysphagia, unspecified: Secondary | ICD-10-CM | POA: Insufficient documentation

## 2023-05-31 DIAGNOSIS — T17908A Unspecified foreign body in respiratory tract, part unspecified causing other injury, initial encounter: Secondary | ICD-10-CM

## 2023-05-31 DIAGNOSIS — M5030 Other cervical disc degeneration, unspecified cervical region: Secondary | ICD-10-CM | POA: Diagnosis not present

## 2023-05-31 NOTE — Progress Notes (Addendum)
 Modified Barium Swallow Study  Patient Details  Name: Wesley Preston. MRN: [id number] Date of Birth: 17-Jul-1940  Today's Date: 05/31/2023  Modified Barium Swallow completed.  Full report located under Chart Review in the Imaging Section.  History of Present Illness Per PCP visit note 05/2023, pt is an 83yo male w/ "Balance/mobility/Ataxia issues -- see PT,  HTN stable on meds; HLD not on statin, UE Tremors on Mysoline, Anxiety/depression, and B12 def on shots.  COPD and Panlobular emphysema noted.  Followed by Cardiology.  Weight stable. Some dysphagia, coughs after eating meals.  Ataxia followed by Neurology. No fever. Denies Cp or SOB".   At this visit today for MBSS, pt reported difficulty swallowing "NUTS and RUFFAGE". He denied any REFLUX s/s but noted a h/o Gastritis per chart and recent DG Esophagus as rec'd by his PCP. Pt endorsed min "different" chewing ability d/t injury of lower jaw "since childhood".  He stated he does Not exclude any foods from his diet and that he does "talk during meals" w/ his Wife; distracted at times. He denied any difficulty swallowing at his recent meals, including when drinking liquids.  NO reported h/o pneumonia per pt/chart.  Per MD referral, pt received at [facility] Esophagus on 05/17/2023 revealing: "Laryngeal penetration and trace silent aspiration. Cleared promptly when the patient was asked to cough. Very small diverticulum in the lower cervical esophagus. Likely a  very early Zenker's diverticulum.  Esophageal dysmotility with intermittent tertiary contractions most  consistent with nonspecific esophageal motility disorder  (presbyesophagus).".  ANY Esophageal phase Dysmotility can impact the oropharyngeal phase of swallowing.    Clinical Impression Patient presents today with grossly functional oropharyngeal phase swallowing for age and in setting of Esophageal phase Dysmotility, as per DG Esophagus 05/2023 (see report). ANY Esophageal phase Dysmotility can  impact the oropharyngeal phase of swallowing. NO aspiration occurred during this study today.    Oral phase is characterized by adequate lip closure, bolus preparation and containment, and anterior to posterior transit. Mastication time min longer w/ increased texture - this is baseline for pt and mostly impacts drier, tough foods he said. Swallow initiation occurs at the level of the valleculae for increased textures/foods; posterior laryngeal surface of the epiglottis w/ liquids, especially thins.  Pharyngeal phase is noted for mildly reduced tongue base retraction (likely age-related), adequate hyolaryngeal excursion, and adequate pharyngeal constriction. Epiglottic deflection is complete though suspect impact from min delay in swallow initiation w/ thin liquids, which resulted in trace-min laryngeal penetration along underneath side of the epiglottis -- this cleared w/ cued throat clearing. NO aspiration occurred during this study. There was No pharyngeal residue, any BOT residue cleared b/t trials w/ involuntary swallowing. Pharyngeal stripping wave is complete.  Amplitude/duration of cricopharyngeus opening is WFL. There is adequate/complete clearance of boluses through the viewable cervical esophagus during this study. No bolus stasis noted in the viewable cervical esophagus. An esophageal sweep was not performed as he had just recently had an DG Esophagus 05/2023 -- see report.   Consistencies tested were thin liquids x2 tsps, 4 cup sips, nectar x1 tsp, 3 cup sip, honey x1 tsp, pudding x1 tsp, regular solid (1/2 graham cracker with pudding). No globus sensation reported.   Recommend patient continue a fairly regular diet with thin liquids via Cup drinking and avoiding any problematic foods that he notes as "difficult". Recommend general aspiration precautions; Reflux precautions as per DG Esophagus and PCP. Educated pt verbally and w/ video viewed re: strategies including moistening dry foods/meats,  alternating solids and liquids, SMALL bites and sips SLOWLY.  No further ST indicated. Factors that may increase risk of adverse event in presence of aspiration Roderick Civatte & Jessy Morocco 2021): Respiratory or GI disease (Age)   Swallow Evaluation Recommendations Recommendations: PO diet PO Diet Recommendation: Regular;Thin liquids (Level 0) (cut, moistened foods) -- avoid problematic foods pt feels "difficult" to eat/swallow Liquid Administration via: Cup;No straw Medication Administration: Whole meds with puree (IF allows for ease of swallowing and Esophageal clearing -- discuss w/ PCP) Supervision: Patient able to self-feed (some impact of UE Tremors) Swallowing strategies: Minimize environmental distractions;Slow rate;Small bites/sips;Multiple dry swallows after each bite/sip;Follow solids with liquids Postural changes: Position pt fully upright for meals;Stay upright 30-60 min after meals (REFL:UX precautions) Oral care recommendations: Oral care BID (2x/day);Pt independent with oral care Recommended consults: Consider GI consultation;Consider esophageal assessment for education/management (If indicated per PCP)        Darla Edward, MS, CCC-SLP Speech Language Pathologist Rehab Services; [facility] [place] [phone] (ascom) Asheley Hellberg 05/31/2023,4:43 PM

## 2023-06-02 ENCOUNTER — Other Ambulatory Visit: Payer: Self-pay | Admitting: Internal Medicine

## 2023-06-02 DIAGNOSIS — R270 Ataxia, unspecified: Secondary | ICD-10-CM

## 2023-06-05 ENCOUNTER — Ambulatory Visit
Admission: RE | Admit: 2023-06-05 | Discharge: 2023-06-05 | Disposition: A | Discharge status: Home / Self Care | Source: Ambulatory Visit | Attending: Internal Medicine | Admitting: Internal Medicine

## 2023-06-05 DIAGNOSIS — R270 Ataxia, unspecified: Secondary | ICD-10-CM | POA: Insufficient documentation

## 2023-06-07 ENCOUNTER — Ambulatory Visit: Attending: Internal Medicine

## 2023-06-07 DIAGNOSIS — R2681 Unsteadiness on feet: Secondary | ICD-10-CM | POA: Insufficient documentation

## 2023-06-07 DIAGNOSIS — M6281 Muscle weakness (generalized): Secondary | ICD-10-CM | POA: Diagnosis present

## 2023-06-07 NOTE — Therapy (Signed)
 OUTPATIENT PHYSICAL THERAPY BALANCE TREATMENT   Patient Name: Wesley Preston. MRN: [id number] DOB:01-Apr-1940, 83 y.o., male Today's Date: 06/08/2023  END OF SESSION:  PT End of Session - 06/07/23 1414     Visit Number 2    Number of Visits 25    Date for PT Re-Evaluation 08/22/23    Authorization Type eval: 05/30/23    PT Start Time 1402    PT Stop Time 1448    PT Time Calculation (min) 46 min    Equipment Utilized During Treatment Gait belt    Activity Tolerance Patient tolerated treatment well    Behavior During Therapy WFL for tasks assessed/performed            Past Medical History:  Diagnosis Date   Anxiety    Back pain    BPH (benign prostatic hyperplasia)    Cancer (HCC)    BASAL CELL   Chest pain, non-cardiac    Colon polyps    Complication of anesthesia    anesthesia stays a long time   COPD (chronic obstructive pulmonary disease) (HCC)    Depression    Essential tremor    Essential tremor    Family history of adverse reaction to anesthesia    DAUGHTERS GET NAUSEATED   GERD (gastroesophageal reflux disease)    H/O   Helicobacter pylori (H. pylori) infection    Hyperlipidemia    Neuromuscular disorder (HCC)    TREMORS   Reactive airway disease    Reactive airway disease    Restless leg syndrome    Past Surgical History:  Procedure Laterality Date   COLONOSCOPY  2013   COLONOSCOPY WITH PROPOFOL  N/A 03/21/2016   Procedure: COLONOSCOPY WITH PROPOFOL ;  Surgeon: Cassie Click, MD;  Location: [facility] ENDOSCOPY;  Service: Endoscopy;  Laterality: N/A;   COLONOSCOPY WITH PROPOFOL  N/A 03/09/2018   Procedure: COLONOSCOPY WITH PROPOFOL ;  Surgeon: Cassie Click, MD;  Location: [facility] ENDOSCOPY;  Service: Endoscopy;  Laterality: N/A;   ESOPHAGOGASTRODUODENOSCOPY     ESOPHAGOGASTRODUODENOSCOPY N/A 03/09/2018   Procedure: ESOPHAGOGASTRODUODENOSCOPY (EGD);  Surgeon: Cassie Click, MD;  Location: [facility] ENDOSCOPY;  Service: Endoscopy;  Laterality: N/A;   HERNIA  REPAIR     HOLEP-LASER ENUCLEATION OF THE PROSTATE WITH MORCELLATION N/A 05/10/2019   Procedure: HOLEP-LASER ENUCLEATION OF THE PROSTATE WITH MORCELLATION;  Surgeon: Lawerence Pressman, MD;  Location: [facility] ORS;  Service: Urology;  Laterality: N/A;   INGUINAL HERNIA REPAIR Right 08/01/2014   Procedure: RIGHT INGUINAL HERNIA  REPAIR WITH MESH ;  Surgeon: Marshall Skeeter, MD;  Location: [facility] ORS;  Service: General;  Laterality: Right; with large Ultra Pro mesh   NASAL SINUS SURGERY     ROTATOR CUFF REPAIR     TRIGGER FINGER RELEASE     Patient Active Problem List   Diagnosis Date Noted   Postprocedural stricture of urethra at fossa navicularis 07/09/2019   BPH with obstruction/lower urinary tract symptoms 08/21/2018   Restless leg syndrome 07/24/2018   Reactive airway disease 07/24/2018   Pure hypercholesterolemia 07/24/2018   Mechanical back pain 07/24/2018   History of Helicobacter pylori infection 07/24/2018   Essential tremor 07/24/2018   Depression 07/24/2018   BPH associated with nocturia 07/24/2018   Benign essential hypertension 07/24/2018   Anxiety 07/24/2018   Trigger finger of left thumb 04/14/2017   Hand pain, left 04/14/2017   Visual disturbance 07/18/2016   Tremor 07/18/2016   Imbalance 07/18/2016   Dizziness 07/18/2016   Right inguinal hernia 07/12/2014  PCP: Yehuda Helms, MD  REFERRING PROVIDER: Yehuda Helms, MD   REFERRING DIAG:  R26.89 (ICD-10-CM) - Other abnormalities of gait and mobility  R42 (ICD-10-CM) - Dizziness and giddiness   RATIONALE FOR EVALUATION AND TREATMENT: Rehabilitation  THERAPY DIAG: Unsteadiness on feet  Muscle weakness (generalized)  ONSET DATE: Multiple years  FOLLOW-UP APPT SCHEDULED WITH REFERRING PROVIDER: Yes, neurology;  FROM INITIAL EVALUATION SUBJECTIVE:                                                                                                                                                                                          SUBJECTIVE STATEMENT:  Unsteadiness  PERTINENT HISTORY:  Patient with worsening gait and balance. Feels he is "dragging his feet". Infrequently using assistive device. Improved RLS since starting gabapentin. Intermittent episodes of vertigo with position changes.  He goes to [employer] for exercise 1-2x/wk. Ongoing tremors in bilateral hands, worse with activity or stress. He reports 4 falls with the most recent about 1 week ago. Pt reports that he was stepping backwards when he fell. He couldn't get up and had to call extended family to help him from the ground. His wife was not strong enough to assist. He denies any significant injury from the fall. He was referred to PT for sensory ataxia and it was recommended he use a walker as well as perform balance exercise.  08/02/2022 MR BRAIN W WO MR ORBITS W WO IMPRESSION:  1. Atrophic right optic nerve which may reflect sequela of prior  optic neuritis.  2. Otherwise unremarkable appearance of the orbits with no acute  finding.  3. No acute intracranial pathology.  4. Moderate background chronic small-vessel ischemic change.   12/30/2019 MR BRAIN WO IMPRESSION:  No evidence of recent infarction, hemorrhage, or mass. Mild progression of parenchymal volume loss and probable chronic microvascular ischemic changes since 2018. Small chronic left pontine infarcts.   06/10/16 CAROTID ARTERY ULTRASOUND  IMPRESSION: 1. Minimal bilateral carotid bifurcation atherosclerotic vascular plaque. No flow limiting stenosis. Degree of stenosis less than 50% bilaterally.  2. Vertebrals are patent with antegrade flow.  06/02/16  MRI BRAIN WO CONTRAST  IMPRESSION: Atrophy. Small vessel disease. Evidence for chronic brainstem lacunar infarction. No acute intracranial findings. Chronic pansinusitis. Despite prior surgery, significant fluid accumulation and mucosal thickening can be seen, particularly in the RIGHT greater than LEFT  frontoethmoid regions.    Pain: No Numbness/Tingling: No, but reports some feet burning; Focal Weakness: No Recent changes in overall health/medication: Yes, adjustment of meds Prior history of physical therapy for balance:  No Dominant hand: right Imaging: Yes  Red flags: Positive for basal  cell carcinoma, Negative for abdominal pain, chills/fever, night sweats, nausea, vomiting, unrelenting pain  PRECAUTIONS: Fall  WEIGHT BEARING RESTRICTIONS: No  FALLS: Has patient fallen in last 6 months? Yes. Number of falls 1, Directional pattern for falls: No  Living Environment Lives with: lives with their spouse Lives in: House/apartment Stairs: one level home, 2 steps to enter from the garage with L rail during ascend.  Has following equipment at home: grab bars in walk-in shower, no seat, rollator (uses periodically)   Prior level of function: Independent  Occupational demands: Retired Pharmacist, hospital  Hobbies: hunting ("I can't get into the stand anymore"), previously like to play golf but he can no longer bend over to put the ball on the tee;  Patient Goals: Pt would like to improve his balance   OBJECTIVE:   Patient Surveys  ABC: To be completed  Cognition Patient is oriented to person, place, and time.  Recent memory is intact.  Remote memory is intact.  Attention span and concentration are intact.  Expressive speech is intact.  Patient's fund of knowledge is within normal limits for educational level.    Gross Musculoskeletal Assessment Tremor: BUE tremors observed, worsened with stress; Bulk: Normal Tone: Normal  Posture: Mild forward head and rounded shoulders  AROM Deferred  LE MMT: Deferred  Sensation Deferred  Reflexes Deferred  Cranial Nerves Deferred  Coordination/Cerebellar Deferred  Bed mobility: Deferred  Transfers: Assistive device utilized: None  Sit to stand: Complete Independence Stand to sit: Complete Independence Chair to  chair: Complete Independence Floor: Not assessed  Curb:  Deferred  Stairs: Level of Assistance: Complete Independence Stair Negotiation Technique: Alternating Pattern  with Bilateral Rails Number of Stairs: 4  Height of Stairs: 6"  Comments: Decreased speed but good stability noted with single or bilateral UE support on rails  Gait: Gait pattern: decreased step length- Right, decreased step length- Left, poor foot clearance- Right, and poor foot clearance- Left Distance walked: 100' Assistive device utilized: None Level of assistance: Complete Independence Comments: Pt ambulates with decreased self-selected speed. Decreased step length bilaterally  mCTSIB: 5.4s  Functional Outcome Measures  Results Comments  BERG 49/56   DGI    FGA    TUG 19.7 seconds   5TSTS 13.0 seconds   6 Minute Walk Test    10 Meter Gait Speed Self-selected: 15.2s = 0.66 m/s; Fastest: 11.8s = 0.85 m/s   (Blank rows = not tested)   TODAY'S TREATMENT    SUBJECTIVE: Pt reports that he is doing well today. No changes since the initial evaluation session. Denies pertinent pain. Patient reports no falls or near falls since last session. No specific questions or concerns.    PAIN: Denies   Neuromuscular Re-education  NuStep L6 x 8 mins for BLE strengthening and warm-up during interval history;  Sensation Grossly intact to light touch throughout bilateral LEs as determined by testing dermatomes L2-S2. Proprioception, stereognosis, and hot/cold testing deferred on this date.  Reflexes R/L Knee Jerk (L3/4): 2+/2+  Ankle Jerk (S1/2): 2+/2+   BPPV TESTS:  Symptoms Duration Intensity Nystagmus  L Dix-Hallpike None   None  R Dix-Hallpike None   None  L Head Roll None   None  R Head Roll None   None  L Sidelying Test      R Sidelying Test      (blank = not tested)  LE MMT: MMT (out of 5) Right  Left   Hip flexion 4 4-  Hip extension    Hip  abduction (seated) 5 5  Hip adduction (seated) 5 5   Hip internal rotation    Hip external rotation    Knee flexion 5 5  Knee extension 4+ 4+  Ankle dorsiflexion 4+ 4+  Ankle plantarflexion Active Active  (* = pain; Blank rows = not tested)  DGI: 17/24; HEP issued and reviewed with patient;   PATIENT EDUCATION:  Education details: Examination findings and HEP Person educated: Patient Education method: Explanation, Demonstration, Verbal cues, and Handouts Education comprehension: verbalized understanding   HOME EXERCISE PROGRAM:  Access Code: [email or web address] URL: [email or web address] Date: 06/07/2023 Prepared by: Crawford Dock  Exercises - Sit to Stand Without Arm Support  - 1 x daily - 7 x weekly - 3 sets - 10 reps - Heel Raises with Counter Support  - 1 x daily - 7 x weekly - 3 sets - 10 reps - Standing Romberg to 1/2 Tandem Stance  - 1 x daily - 7 x weekly - 3 reps - 30s hold   ASSESSMENT:  CLINICAL IMPRESSION: Patient presented to session today with no new concerns. Gross LE MMT revealed weakness in BL hip flexors, knee extensors, and ankle dorsiflexors. Pt was evaluated for BPPV as a cause of his dizziness, no nystagmus or vertigo was noted and testing. Pt scored a 17/24 on the DGI outcome measure. A score less than 19 is predictive of falls in the elderly, indicating that this patient is at a falls risk while ambulating. Pt would benefit from continued physical therapy services at this time so he can increase his strength, decrease his ambulation falls risk, and improve his balance so he can participate safely in his environment.   OBJECTIVE IMPAIRMENTS: Abnormal gait, decreased balance, difficulty walking, decreased strength, and dizziness.   ACTIVITY LIMITATIONS: standing, stairs, transfers, and caring for others  PARTICIPATION LIMITATIONS: meal prep, cleaning, laundry, shopping, and community activity  PERSONAL FACTORS: Age, Past/current experiences, Time since onset of injury/illness/exacerbation, and 3+  comorbidities: back pain, tremors, anxiety, depression are also affecting patient's functional outcome.   REHAB POTENTIAL: Good  CLINICAL DECISION MAKING: Unstable/unpredictable  EVALUATION COMPLEXITY: High   GOALS: Goals reviewed with patient? No  SHORT TERM GOALS: Target date: 07/11/2023  Pt will be independent with HEP in order to improve strength and balance in order to decrease fall risk and improve function at home. Baseline:  Goal status: INITIAL   LONG TERM GOALS: Target date: 08/22/2023   Pt will improve ABC by at least 13% in order to demonstrate clinically significant improvement in balance confidence.  Baseline: 62.2% Goal status: INITIAL  2.  Pt will improve BERG by at least 3 points in order to demonstrate clinically significant improvement in balance.   Baseline: 49/56 Goal status: INITIAL  3.  Pt will decrease TUG to below 14 seconds/decrease in order to demonstrate decreased fall risk.       Baseline: 19.7s Goal status: INITIAL  4. Pt will increase self-selected 31m gait speed to at least 0.80 m/s in order to demonstrate clinically significant improvement in community ambulation.     Baseline: 0.66 m/s Goal status: INITIAL  PLAN: PT FREQUENCY: 2x/week  PT DURATION: 8 weeks  PLANNED INTERVENTIONS: Therapeutic exercises, Therapeutic activity, Neuromuscular re-education, Balance training, Gait training, Patient/Family education, Self Care, Joint mobilization, Joint manipulation, Vestibular training, Canalith repositioning, Orthotic/Fit training, DME instructions, Dry Needling, Electrical stimulation, Spinal manipulation, Spinal mobilization, Cryotherapy, Moist heat, Taping, Traction, Ultrasound, Ionotophoresis 4mg /ml Dexamethasone , Manual therapy, and Re-evaluation.  PLAN FOR NEXT SESSION: coordination testing, , progress  strength/balance exercises, review/modify HEP;    Sherill Ding Shamari Trostel PT, DPT, GCS  Valin Massie 06/08/2023, 12:27 PM

## 2023-06-08 NOTE — Therapy (Signed)
 OUTPATIENT PHYSICAL THERAPY BALANCE TREATMENT   Patient Name: Wesley Preston. MRN: [id number] DOB:05/26/1940, 83 y.o., male Today's Date: 06/09/2023  END OF SESSION:  PT End of Session - 06/09/23 1109     Visit Number 3    Number of Visits 25    Date for PT Re-Evaluation 08/22/23    Authorization Type eval: 05/30/23    PT Start Time 1105    PT Stop Time 1150    PT Time Calculation (min) 45 min    Equipment Utilized During Treatment Gait belt    Activity Tolerance Patient tolerated treatment well    Behavior During Therapy WFL for tasks assessed/performed            Past Medical History:  Diagnosis Date   Anxiety    Back pain    BPH (benign prostatic hyperplasia)    Cancer (HCC)    BASAL CELL   Chest pain, non-cardiac    Colon polyps    Complication of anesthesia    anesthesia stays a long time   COPD (chronic obstructive pulmonary disease) (HCC)    Depression    Essential tremor    Essential tremor    Family history of adverse reaction to anesthesia    DAUGHTERS GET NAUSEATED   GERD (gastroesophageal reflux disease)    H/O   Helicobacter pylori (H. pylori) infection    Hyperlipidemia    Neuromuscular disorder (HCC)    TREMORS   Reactive airway disease    Reactive airway disease    Restless leg syndrome    Past Surgical History:  Procedure Laterality Date   COLONOSCOPY  2013   COLONOSCOPY WITH PROPOFOL  N/A 03/21/2016   Procedure: COLONOSCOPY WITH PROPOFOL ;  Surgeon: Cassie Click, MD;  Location: [facility] ENDOSCOPY;  Service: Endoscopy;  Laterality: N/A;   COLONOSCOPY WITH PROPOFOL  N/A 03/09/2018   Procedure: COLONOSCOPY WITH PROPOFOL ;  Surgeon: Cassie Click, MD;  Location: [facility] ENDOSCOPY;  Service: Endoscopy;  Laterality: N/A;   ESOPHAGOGASTRODUODENOSCOPY     ESOPHAGOGASTRODUODENOSCOPY N/A 03/09/2018   Procedure: ESOPHAGOGASTRODUODENOSCOPY (EGD);  Surgeon: Cassie Click, MD;  Location: [facility] ENDOSCOPY;  Service: Endoscopy;  Laterality: N/A;   HERNIA  REPAIR     HOLEP-LASER ENUCLEATION OF THE PROSTATE WITH MORCELLATION N/A 05/10/2019   Procedure: HOLEP-LASER ENUCLEATION OF THE PROSTATE WITH MORCELLATION;  Surgeon: Lawerence Pressman, MD;  Location: [facility] ORS;  Service: Urology;  Laterality: N/A;   INGUINAL HERNIA REPAIR Right 08/01/2014   Procedure: RIGHT INGUINAL HERNIA  REPAIR WITH MESH ;  Surgeon: Marshall Skeeter, MD;  Location: [facility] ORS;  Service: General;  Laterality: Right; with large Ultra Pro mesh   NASAL SINUS SURGERY     ROTATOR CUFF REPAIR     TRIGGER FINGER RELEASE     Patient Active Problem List   Diagnosis Date Noted   Postprocedural stricture of urethra at fossa navicularis 07/09/2019   BPH with obstruction/lower urinary tract symptoms 08/21/2018   Restless leg syndrome 07/24/2018   Reactive airway disease 07/24/2018   Pure hypercholesterolemia 07/24/2018   Mechanical back pain 07/24/2018   History of Helicobacter pylori infection 07/24/2018   Essential tremor 07/24/2018   Depression 07/24/2018   BPH associated with nocturia 07/24/2018   Benign essential hypertension 07/24/2018   Anxiety 07/24/2018   Trigger finger of left thumb 04/14/2017   Hand pain, left 04/14/2017   Visual disturbance 07/18/2016   Tremor 07/18/2016   Imbalance 07/18/2016   Dizziness 07/18/2016   Right inguinal hernia 07/12/2014  PCP: Yehuda Helms, MD  REFERRING PROVIDER: Yehuda Helms, MD   REFERRING DIAG:  R26.89 (ICD-10-CM) - Other abnormalities of gait and mobility  R42 (ICD-10-CM) - Dizziness and giddiness   RATIONALE FOR EVALUATION AND TREATMENT: Rehabilitation  THERAPY DIAG: Unsteadiness on feet  Muscle weakness (generalized)  ONSET DATE: Multiple years  FOLLOW-UP APPT SCHEDULED WITH REFERRING PROVIDER: Yes, neurology;  FROM INITIAL EVALUATION SUBJECTIVE:                                                                                                                                                                                          SUBJECTIVE STATEMENT:  Unsteadiness  PERTINENT HISTORY:  Patient with worsening gait and balance. Feels he is "dragging his feet". Infrequently using assistive device. Improved RLS since starting gabapentin. Intermittent episodes of vertigo with position changes.  He goes to [employer] for exercise 1-2x/wk. Ongoing tremors in bilateral hands, worse with activity or stress. He reports 4 falls with the most recent about 1 week ago. Pt reports that he was stepping backwards when he fell. He couldn't get up and had to call extended family to help him from the ground. His wife was not strong enough to assist. He denies any significant injury from the fall. He was referred to PT for sensory ataxia and it was recommended he use a walker as well as perform balance exercise.  08/02/2022 MR BRAIN W WO MR ORBITS W WO IMPRESSION:  1. Atrophic right optic nerve which may reflect sequela of prior  optic neuritis.  2. Otherwise unremarkable appearance of the orbits with no acute  finding.  3. No acute intracranial pathology.  4. Moderate background chronic small-vessel ischemic change.   12/30/2019 MR BRAIN WO IMPRESSION:  No evidence of recent infarction, hemorrhage, or mass. Mild progression of parenchymal volume loss and probable chronic microvascular ischemic changes since 2018. Small chronic left pontine infarcts.   06/10/16 CAROTID ARTERY ULTRASOUND  IMPRESSION: 1. Minimal bilateral carotid bifurcation atherosclerotic vascular plaque. No flow limiting stenosis. Degree of stenosis less than 50% bilaterally.  2. Vertebrals are patent with antegrade flow.  06/02/16  MRI BRAIN WO CONTRAST  IMPRESSION: Atrophy. Small vessel disease. Evidence for chronic brainstem lacunar infarction. No acute intracranial findings. Chronic pansinusitis. Despite prior surgery, significant fluid accumulation and mucosal thickening can be seen, particularly in the RIGHT greater than LEFT  frontoethmoid regions.    Pain: No Numbness/Tingling: No, but reports some feet burning; Focal Weakness: No Recent changes in overall health/medication: Yes, adjustment of meds Prior history of physical therapy for balance:  No Dominant hand: right Imaging: Yes  Red flags: Positive for basal  cell carcinoma, Negative for abdominal pain, chills/fever, night sweats, nausea, vomiting, unrelenting pain  PRECAUTIONS: Fall  WEIGHT BEARING RESTRICTIONS: No  FALLS: Has patient fallen in last 6 months? Yes. Number of falls 1, Directional pattern for falls: No  Living Environment Lives with: lives with their spouse Lives in: House/apartment Stairs: one level home, 2 steps to enter from the garage with L rail during ascend.  Has following equipment at home: grab bars in walk-in shower, no seat, rollator (uses periodically)   Prior level of function: Independent  Occupational demands: Retired Pharmacist, hospital  Hobbies: hunting ("I can't get into the stand anymore"), previously like to play golf but he can no longer bend over to put the ball on the tee;  Patient Goals: Pt would like to improve his balance   OBJECTIVE:   Patient Surveys  ABC: To be completed  Cognition Patient is oriented to person, place, and time.  Recent memory is intact.  Remote memory is intact.  Attention span and concentration are intact.  Expressive speech is intact.  Patient's fund of knowledge is within normal limits for educational level.    Gross Musculoskeletal Assessment Tremor: BUE tremors observed, worsened with stress; Bulk: Normal Tone: Normal  Posture: Mild forward head and rounded shoulders  AROM Deferred  LE MMT: MMT (out of 5) Right  Left   Hip flexion 4 4-  Hip extension    Hip abduction (seated) 5 5  Hip adduction (seated) 5 5  Hip internal rotation    Hip external rotation    Knee flexion 5 5  Knee extension 4+ 4+  Ankle dorsiflexion 4+ 4+  Ankle plantarflexion Active Active   (* = pain; Blank rows = not tested)  Sensation Grossly intact to light touch throughout bilateral LEs as determined by testing dermatomes L2-S2. Proprioception, stereognosis, and hot/cold testing deferred on this date.  Reflexes R/L Knee Jerk (L3/4): 2+/2+  Ankle Jerk (S1/2): 2+/2+   Cranial Nerves Deferred  Coordination/Cerebellar Deferred  Transfers: Assistive device utilized: None  Sit to stand: Complete Independence Stand to sit: Complete Independence Chair to chair: Complete Independence Floor: Not assessed  Stairs: Level of Assistance: Complete Independence Stair Negotiation Technique: Alternating Pattern  with Bilateral Rails Number of Stairs: 4  Height of Stairs: 6"  Comments: Decreased speed but good stability noted with single or bilateral UE support on rails  Gait: Gait pattern: decreased step length- Right, decreased step length- Left, poor foot clearance- Right, and poor foot clearance- Left Distance walked: 100' Assistive device utilized: None Level of assistance: Complete Independence Comments: Pt ambulates with decreased self-selected speed. Decreased step length bilaterally  BPPV TESTS:  Symptoms Duration Intensity Nystagmus  L Dix-Hallpike None   None  R Dix-Hallpike None   None  L Head Roll None   None  R Head Roll None   None  L Sidelying Test      R Sidelying Test      (blank = not tested)  mCTSIB: 5.4s  Functional Outcome Measures  05/30/23 06/07/23 Comments  BERG 49/56    DGI  17/24   FGA     TUG 19.7 seconds    5TSTS 13.0 seconds    6 Minute Walk Test     10 Meter Gait Speed Self-selected: 15.2s = 0.66 m/s; Fastest: 11.8s = 0.85 m/s    (Blank rows = not tested)    TODAY'S TREATMENT    SUBJECTIVE: Pt reports that he is doing well today. No changes since the last therapy  session. Denies pertinent pain. Patient reports no falls or near falls since last session. No specific questions or concerns.    PAIN: Denies   Ther-ex   NuStep L2-6 x 5 mins for BLE strengthening and warm-up during interval history;  : 53' with CGA Pre-vitals: seated: BP: 138/66 mmHg, HR: 68 bpm, SpO2: 95% Post-vitals: seated: BP: 153/77 mmHg, HR: 91 bpm, SpO2: 88%  Seated marches with 4# ankle weights (AW) x 10 BLE; Seated clams with manual resistance from therapist x 10 BLE; Seated adductor squeezes with manual resistance from therapist x 10 BLE; Seated alternating LAQ with 4# AW x 10 BLE;  Standing exercises with 4# AW: Hip flexion marches x 10 BLE; Hamstring curls x 10 BLE; Hip abduction x 10 BLE; Hip extension x 10 BLE;   Neuromuscular Re-education  All balance exercises performed in parallel bars and without UE support unless otherwise specified:  Coordination/Cerebellar Testing Finger to Nose: Dysmetria BUE but difficult to assess secondary to tremor; Heel to Shin: WNL Rapid alternating movements: WNL Finger Opposition: WNL Pronator Drift: Negative  Airex balance beam side stepping x multiple laps; Airex feet apart eyes open/closed x 30s each; Airex feet apart eyes open with horizontal and vertical head turns x 30s each; Airex feet together eyes open/closed x 30s each; Airex feet together eyes open with horizontal and vertical head turns x 30s each;   PATIENT EDUCATION:  Education details: Pt educated throughout session about proper posture and technique with exercises. Improved exercise technique, movement at target joints, use of target muscles after min to mod verbal, visual, tactile cues.  Person educated: Patient Education method: Explanation, Demonstration, Verbal cues, and Handouts Education comprehension: verbalized understanding   HOME EXERCISE PROGRAM:  Access Code: [email or web address] URL: [email or web address] Date: 06/07/2023 Prepared by: Crawford Dock  Exercises - Sit to Stand Without Arm Support  - 1 x daily - 7 x weekly - 3 sets - 10 reps - Heel Raises with Counter Support  - 1 x  daily - 7 x weekly - 3 sets - 10 reps - Standing Romberg to 1/2 Tandem Stance  - 1 x daily - 7 x weekly - 3 reps - 30s hold   ASSESSMENT:  CLINICAL IMPRESSION: Performed with patient during session and his distance of 59' is below age/gender norm of 1368'. Notable BUE tremor during coordination testing. Additional time during session focused on progressing both strength and balance exercises. No HEP modifications at this time but will assess next week for possible update. Pt encouraged to follow-up as scheduled. Pt would benefit from continued physical therapy services at this time so he can increase his strength, decrease his ambulation falls risk, and improve his balance so he can participate safely in his environment.   OBJECTIVE IMPAIRMENTS: Abnormal gait, decreased balance, difficulty walking, decreased strength, and dizziness.   ACTIVITY LIMITATIONS: standing, stairs, transfers, and caring for others  PARTICIPATION LIMITATIONS: meal prep, cleaning, laundry, shopping, and community activity  PERSONAL FACTORS: Age, Past/current experiences, Time since onset of injury/illness/exacerbation, and 3+ comorbidities: back pain, tremors, anxiety, depression are also affecting patient's functional outcome.   REHAB POTENTIAL: Good  CLINICAL DECISION MAKING: Unstable/unpredictable  EVALUATION COMPLEXITY: High   GOALS: Goals reviewed with patient? No  SHORT TERM GOALS: Target date: 07/11/2023  Pt will be independent with HEP in order to improve strength and balance in order to decrease fall risk and improve function at home. Baseline:  Goal status: INITIAL   LONG TERM GOALS: Target date: 08/22/2023   Pt  will improve ABC by at least 13% in order to demonstrate clinically significant improvement in balance confidence.  Baseline: 62.2% Goal status: INITIAL  2.  Pt will improve BERG by at least 3 points in order to demonstrate clinically significant improvement in balance.   Baseline:  49/56 Goal status: INITIAL  3.  Pt will decrease TUG to below 14 seconds/decrease in order to demonstrate decreased fall risk.       Baseline: 19.7s Goal status: INITIAL  4. Pt will increase self-selected 73m gait speed to at least 0.80 m/s in order to demonstrate clinically significant improvement in community ambulation.     Baseline: 0.66 m/s Goal status: INITIAL  5. Pt will increase by at least 21m (165ft) in order to demonstrate clinically significant improvement in cardiopulmonary endurance and community ambulation      Baseline: 06/09/23: 916' Goal status: INITIAL  PLAN: PT FREQUENCY: 2x/week  PT DURATION: 8 weeks  PLANNED INTERVENTIONS: Therapeutic exercises, Therapeutic activity, Neuromuscular re-education, Balance training, Gait training, Patient/Family education, Self Care, Joint mobilization, Joint manipulation, Vestibular training, Canalith repositioning, Orthotic/Fit training, DME instructions, Dry Needling, Electrical stimulation, Spinal manipulation, Spinal mobilization, Cryotherapy, Moist heat, Taping, Traction, Ultrasound, Ionotophoresis 4mg /ml Dexamethasone , Manual therapy, and Re-evaluation.  PLAN FOR NEXT SESSION:  progress strength/balance exercises, review/modify HEP as necessary;    Curlie Sittner D [place] PT, DPT, GCS  Ivadell Gaul 06/09/2023, 12:11 PM

## 2023-06-09 ENCOUNTER — Ambulatory Visit

## 2023-06-09 DIAGNOSIS — M6281 Muscle weakness (generalized): Secondary | ICD-10-CM

## 2023-06-09 DIAGNOSIS — R2681 Unsteadiness on feet: Secondary | ICD-10-CM

## 2023-06-14 ENCOUNTER — Ambulatory Visit

## 2023-06-14 DIAGNOSIS — M6281 Muscle weakness (generalized): Secondary | ICD-10-CM

## 2023-06-14 DIAGNOSIS — R2681 Unsteadiness on feet: Secondary | ICD-10-CM | POA: Diagnosis not present

## 2023-06-14 NOTE — Therapy (Signed)
 OUTPATIENT PHYSICAL THERAPY BALANCE TREATMENT   Patient Name: Wesley Preston. MRN: [id number] DOB:[birth year], 83 y.o., male Today's Date: 06/15/2023  END OF SESSION:  PT End of Session - 06/14/23 1334     Visit Number 4    Number of Visits 25    Date for PT Re-Evaluation 08/22/23    Authorization Type eval: 05/30/23    PT Start Time 1330    PT Stop Time 1402    PT Time Calculation (min) 32 min    Equipment Utilized During Treatment Gait belt    Activity Tolerance Patient tolerated treatment well    Behavior During Therapy WFL for tasks assessed/performed         Past Medical History:  Diagnosis Date   Anxiety    Back pain    BPH (benign prostatic hyperplasia)    Cancer (HCC)    BASAL CELL   Chest pain, non-cardiac    Colon polyps    Complication of anesthesia    anesthesia stays a long time   COPD (chronic obstructive pulmonary disease) (HCC)    Depression    Essential tremor    Essential tremor    Family history of adverse reaction to anesthesia    DAUGHTERS GET NAUSEATED   GERD (gastroesophageal reflux disease)    H/O   Helicobacter pylori (H. pylori) infection    Hyperlipidemia    Neuromuscular disorder (HCC)    TREMORS   Reactive airway disease    Reactive airway disease    Restless leg syndrome    Past Surgical History:  Procedure Laterality Date   COLONOSCOPY  2013   COLONOSCOPY WITH PROPOFOL  N/A 03/21/2016   Procedure: COLONOSCOPY WITH PROPOFOL ;  Surgeon: Cassie Click, MD;  Location: [facility] ENDOSCOPY;  Service: Endoscopy;  Laterality: N/A;   COLONOSCOPY WITH PROPOFOL  N/A 03/09/2018   Procedure: COLONOSCOPY WITH PROPOFOL ;  Surgeon: Cassie Click, MD;  Location: [facility] ENDOSCOPY;  Service: Endoscopy;  Laterality: N/A;   ESOPHAGOGASTRODUODENOSCOPY     ESOPHAGOGASTRODUODENOSCOPY N/A 03/09/2018   Procedure: ESOPHAGOGASTRODUODENOSCOPY (EGD);  Surgeon: Cassie Click, MD;  Location: [facility] ENDOSCOPY;  Service: Endoscopy;  Laterality: N/A;   HERNIA REPAIR      HOLEP-LASER ENUCLEATION OF THE PROSTATE WITH MORCELLATION N/A 05/10/2019   Procedure: HOLEP-LASER ENUCLEATION OF THE PROSTATE WITH MORCELLATION;  Surgeon: Lawerence Pressman, MD;  Location: [facility] ORS;  Service: Urology;  Laterality: N/A;   INGUINAL HERNIA REPAIR Right 08/01/2014   Procedure: RIGHT INGUINAL HERNIA  REPAIR WITH MESH ;  Surgeon: Marshall Skeeter, MD;  Location: [facility] ORS;  Service: General;  Laterality: Right; with large Ultra Pro mesh   NASAL SINUS SURGERY     ROTATOR CUFF REPAIR     TRIGGER FINGER RELEASE     Patient Active Problem List   Diagnosis Date Noted   Postprocedural stricture of urethra at fossa navicularis 07/09/2019   BPH with obstruction/lower urinary tract symptoms 08/21/2018   Restless leg syndrome 07/24/2018   Reactive airway disease 07/24/2018   Pure hypercholesterolemia 07/24/2018   Mechanical back pain 07/24/2018   History of Helicobacter pylori infection 07/24/2018   Essential tremor 07/24/2018   Depression 07/24/2018   BPH associated with nocturia 07/24/2018   Benign essential hypertension 07/24/2018   Anxiety 07/24/2018   Trigger finger of left thumb 04/14/2017   Hand pain, left 04/14/2017   Visual disturbance 07/18/2016   Tremor 07/18/2016   Imbalance 07/18/2016   Dizziness 07/18/2016   Right inguinal hernia 07/12/2014    PCP: Claudius Cumins,  Rosalynn Come, MD  REFERRING PROVIDER: Yehuda Helms, MD   REFERRING DIAG:  R26.89 (ICD-10-CM) - Other abnormalities of gait and mobility  R42 (ICD-10-CM) - Dizziness and giddiness   RATIONALE FOR EVALUATION AND TREATMENT: Rehabilitation  THERAPY DIAG: Unsteadiness on feet  Muscle weakness (generalized)  ONSET DATE: Multiple years  FOLLOW-UP APPT SCHEDULED WITH REFERRING PROVIDER: Yes, neurology;  FROM INITIAL EVALUATION SUBJECTIVE:                                                                                                                                                                                          SUBJECTIVE STATEMENT:  Unsteadiness  PERTINENT HISTORY:  Patient with worsening gait and balance. Feels he is dragging his feet. Infrequently using assistive device. Improved RLS since starting gabapentin. Intermittent episodes of vertigo with position changes.  He goes to [employer] for exercise 1-2x/wk. Ongoing tremors in bilateral hands, worse with activity or stress. He reports 4 falls with the most recent about 1 week ago. Pt reports that he was stepping backwards when he fell. He couldn't get up and had to call extended family to help him from the ground. His wife was not strong enough to assist. He denies any significant injury from the fall. He was referred to PT for sensory ataxia and it was recommended he use a walker as well as perform balance exercise.  08/02/2022 MR BRAIN W WO MR ORBITS W WO IMPRESSION:  1. Atrophic right optic nerve which may reflect sequela of prior  optic neuritis.  2. Otherwise unremarkable appearance of the orbits with no acute  finding.  3. No acute intracranial pathology.  4. Moderate background chronic small-vessel ischemic change.   12/30/2019 MR BRAIN WO IMPRESSION:  No evidence of recent infarction, hemorrhage, or mass. Mild progression of parenchymal volume loss and probable chronic microvascular ischemic changes since 2018. Small chronic left pontine infarcts.   06/10/16 CAROTID ARTERY ULTRASOUND  IMPRESSION: 1. Minimal bilateral carotid bifurcation atherosclerotic vascular plaque. No flow limiting stenosis. Degree of stenosis less than 50% bilaterally.  2. Vertebrals are patent with antegrade flow.  06/02/16  MRI BRAIN WO CONTRAST  IMPRESSION: Atrophy. Small vessel disease. Evidence for chronic brainstem lacunar infarction. No acute intracranial findings. Chronic pansinusitis. Despite prior surgery, significant fluid accumulation and mucosal thickening can be seen, particularly in the RIGHT greater than LEFT frontoethmoid  regions.    Pain: No Numbness/Tingling: No, but reports some feet burning; Focal Weakness: No Recent changes in overall health/medication: Yes, adjustment of meds Prior history of physical therapy for balance:  No Dominant hand: right Imaging: Yes  Red flags: Positive for basal cell carcinoma,  Negative for abdominal pain, chills/fever, night sweats, nausea, vomiting, unrelenting pain  PRECAUTIONS: Fall  WEIGHT BEARING RESTRICTIONS: No  FALLS: Has patient fallen in last 6 months? Yes. Number of falls 1, Directional pattern for falls: No  Living Environment Lives with: lives with their spouse Lives in: House/apartment Stairs: one level home, 2 steps to enter from the garage with L rail during ascend.  Has following equipment at home: grab bars in walk-in shower, no seat, rollator (uses periodically)   Prior level of function: Independent  Occupational demands: Retired Nutritional therapist: hunting (I can't get into the stand anymore), previously like to play golf but he can no longer bend over to put the ball on the tee;  Patient Goals: Pt would like to improve his balance   OBJECTIVE:   Patient Surveys  ABC: To be completed  Cognition Patient is oriented to person, place, and time.  Recent memory is intact.  Remote memory is intact.  Attention span and concentration are intact.  Expressive speech is intact.  Patient's fund of knowledge is within normal limits for educational level.    Gross Musculoskeletal Assessment Tremor: BUE tremors observed, worsened with stress; Bulk: Normal Tone: Normal  Posture: Mild forward head and rounded shoulders  AROM Deferred  LE MMT: MMT (out of 5) Right  Left   Hip flexion 4 4-  Hip extension    Hip abduction (seated) 5 5  Hip adduction (seated) 5 5  Hip internal rotation    Hip external rotation    Knee flexion 5 5  Knee extension 4+ 4+  Ankle dorsiflexion 4+ 4+  Ankle plantarflexion Active Active  (* = pain;  Blank rows = not tested)  Sensation Grossly intact to light touch throughout bilateral LEs as determined by testing dermatomes L2-S2. Proprioception, stereognosis, and hot/cold testing deferred on this date.  Reflexes R/L Knee Jerk (L3/4): 2+/2+  Ankle Jerk (S1/2): 2+/2+   Cranial Nerves Deferred  Coordination/Cerebellar Deferred  Transfers: Assistive device utilized: None  Sit to stand: Complete Independence Stand to sit: Complete Independence Chair to chair: Complete Independence Floor: Not assessed  Stairs: Level of Assistance: Complete Independence Stair Negotiation Technique: Alternating Pattern  with Bilateral Rails Number of Stairs: 4  Height of Stairs: 6  Comments: Decreased speed but good stability noted with single or bilateral UE support on rails  Gait: Gait pattern: decreased step length- Right, decreased step length- Left, poor foot clearance- Right, and poor foot clearance- Left Distance walked: 100' Assistive device utilized: None Level of assistance: Complete Independence Comments: Pt ambulates with decreased self-selected speed. Decreased step length bilaterally  BPPV TESTS:  Symptoms Duration Intensity Nystagmus  L Dix-Hallpike None   None  R Dix-Hallpike None   None  L Head Roll None   None  R Head Roll None   None  L Sidelying Test      R Sidelying Test      (blank = not tested)  mCTSIB: 5.4s  Functional Outcome Measures  05/30/23 06/07/23 Comments  BERG 49/56    DGI  17/24   FGA     TUG 19.7 seconds    5TSTS 13.0 seconds    6 Minute Walk Test     10 Meter Gait Speed Self-selected: 15.2s = 0.66 m/s; Fastest: 11.8s = 0.85 m/s    (Blank rows = not tested)  Coordination/Cerebellar Testing Finger to Nose: Dysmetria BUE but difficult to assess secondary to tremor; Heel to Shin: WNL Rapid alternating movements: WNL Finger Opposition:  WNL Pronator Drift: Negative   TODAY'S TREATMENT: 06/14/23    SUBJECTIVE: Pt reports that he is doing  well today. No changes since the last therapy session. Denies pertinent pain. Patient reports no falls since last session, but pt does report several stumbles which he reports always catching himself. No specific questions or concerns.    PAIN: Denies   Ther-ex  NuStep L2-6 x 8 mins for BLE strengthening and warm-up during interval history;  Cross-body marches with 5# ankle weights (AW) x 3 laps in // bars Seated physioball with PT ball toss of varying heights in // bars x 1 min  Standing exercises with 5# AW: Hip flexion marches x 10 BLE; Hamstring curls x 10 BLE; Hip abduction x 10 BLE; Hip extension x 10 BLE;   Neuromuscular Re-education  All balance exercises performed in parallel bars and without UE support unless otherwise specified:  Airex balance beam tandem stepping x multiple laps; Airex feet together eyes open/closed x 30s each; Airex feet together eyes open with horizontal and vertical head turns x 30s each;   NOT PERFORMED TODAY:  Airex feet apart eyes open/closed x 30s each; Airex feet apart eyes open with horizontal and vertical head turns x 30s each; Seated clams with manual resistance from therapist x 10 BLE; Seated adductor squeezes with manual resistance from therapist x 10 BLE; Seated alternating LAQ with 5# AW x 10 BLE; Airex balance beam side stepping x multiple laps;  PATIENT EDUCATION:  Education details: Pt educated throughout session about proper posture and technique with exercises. Improved exercise technique, movement at target joints, use of target muscles after min to mod verbal, visual, tactile cues.  Person educated: Patient Education method: Explanation, Demonstration, Verbal cues, and Handouts Education comprehension: verbalized understanding   HOME EXERCISE PROGRAM:  Access Code: [email or web address] URL: [email or web address] Date: 06/07/2023 Prepared by: Crawford Dock  Exercises - Sit to Stand Without Arm Support  - 1 x daily  - 7 x weekly - 3 sets - 10 reps - Heel Raises with Counter Support  - 1 x daily - 7 x weekly - 3 sets - 10 reps - Standing Romberg to 1/2 Tandem Stance  - 1 x daily - 7 x weekly - 3 reps - 30s hold   ASSESSMENT:  CLINICAL IMPRESSION: Pt arrived to session 15 minutes late so time was abbreviated secondary to scheduling. Notable BUE tremor present during today's session. Increased to 5 # AW this session to progress strengthening exercises. Cross body marches were incorporated to encourage foot clearance during gait. Seated physioball ball toss was incorporated to encourage upright posture, challenge the pt's balance, and to strengthen abdominal musculature. No HEP modifications at this time but will assess next week for possible update. Pt encouraged to follow-up as scheduled. Pt would benefit from continued physical therapy services at this time so he can increase his strength, decrease his ambulation falls risk, and improve his balance so he can participate safely in his environment.   OBJECTIVE IMPAIRMENTS: Abnormal gait, decreased balance, difficulty walking, decreased strength, and dizziness.   ACTIVITY LIMITATIONS: standing, stairs, transfers, and caring for others  PARTICIPATION LIMITATIONS: meal prep, cleaning, laundry, shopping, and community activity  PERSONAL FACTORS: Age, Past/current experiences, Time since onset of injury/illness/exacerbation, and 3+ comorbidities: back pain, tremors, anxiety, depression are also affecting patient's functional outcome.   REHAB POTENTIAL: Good  CLINICAL DECISION MAKING: Unstable/unpredictable  EVALUATION COMPLEXITY: High   GOALS: Goals reviewed with patient? No  SHORT TERM GOALS: Target date: 07/11/2023  Pt will be independent with HEP in order to improve strength and balance in order to decrease fall risk and improve function at home. Baseline:  Goal status: INITIAL   LONG TERM GOALS: Target date: 08/22/2023   Pt will improve ABC by at  least 13% in order to demonstrate clinically significant improvement in balance confidence.  Baseline: 62.2% Goal status: INITIAL  2.  Pt will improve BERG by at least 3 points in order to demonstrate clinically significant improvement in balance.   Baseline: 49/56 Goal status: INITIAL  3.  Pt will decrease TUG to below 14 seconds/decrease in order to demonstrate decreased fall risk.       Baseline: 19.7s Goal status: INITIAL  4. Pt will increase self-selected 41m gait speed to at least 0.80 m/s in order to demonstrate clinically significant improvement in community ambulation.     Baseline: 0.66 m/s Goal status: INITIAL  5. Pt will increase by at least 51m (135ft) in order to demonstrate clinically significant improvement in cardiopulmonary endurance and community ambulation      Baseline: 06/09/23: 916' Goal status: INITIAL  PLAN: PT FREQUENCY: 2x/week  PT DURATION: 8 weeks  PLANNED INTERVENTIONS: Therapeutic exercises, Therapeutic activity, Neuromuscular re-education, Balance training, Gait training, Patient/Family education, Self Care, Joint mobilization, Joint manipulation, Vestibular training, Canalith repositioning, Orthotic/Fit training, DME instructions, Dry Needling, Electrical stimulation, Spinal manipulation, Spinal mobilization, Cryotherapy, Moist heat, Taping, Traction, Ultrasound, Ionotophoresis 4mg /ml Dexamethasone , Manual therapy, and Re-evaluation.  PLAN FOR NEXT SESSION:  progress strength/balance exercises, review/modify HEP as necessary; blaze pod reaching, cone taps, physioball rows, forward and lateral step-ups  Sheril Dines, SPT Sherill Ding Huprich PT, DPT, GCS  Huprich,Jason 06/15/2023, 9:49 AM

## 2023-06-20 ENCOUNTER — Encounter

## 2023-06-21 ENCOUNTER — Ambulatory Visit

## 2023-06-21 DIAGNOSIS — R2681 Unsteadiness on feet: Secondary | ICD-10-CM

## 2023-06-21 DIAGNOSIS — M6281 Muscle weakness (generalized): Secondary | ICD-10-CM

## 2023-06-21 NOTE — Therapy (Signed)
 OUTPATIENT PHYSICAL THERAPY BALANCE TREATMENT   Patient Name: Wesley Preston. MRN: [id number] DOB:[birth year], 83 y.o., male Today's Date: 06/22/2023  END OF SESSION:  PT End of Session - 06/21/23 1158     Visit Number 5    Number of Visits 25    Date for PT Re-Evaluation 08/22/23    Authorization Type eval: 05/30/23    PT Start Time 1157    PT Stop Time 1235    PT Time Calculation (min) 38 min    Equipment Utilized During Treatment Gait belt    Activity Tolerance Patient tolerated treatment well    Behavior During Therapy WFL for tasks assessed/performed         Past Medical History:  Diagnosis Date   Anxiety    Back pain    BPH (benign prostatic hyperplasia)    Cancer (HCC)    BASAL CELL   Chest pain, non-cardiac    Colon polyps    Complication of anesthesia    anesthesia stays a long time   COPD (chronic obstructive pulmonary disease) (HCC)    Depression    Essential tremor    Essential tremor    Family history of adverse reaction to anesthesia    DAUGHTERS GET NAUSEATED   GERD (gastroesophageal reflux disease)    H/O   Helicobacter pylori (H. pylori) infection    Hyperlipidemia    Neuromuscular disorder (HCC)    TREMORS   Reactive airway disease    Reactive airway disease    Restless leg syndrome    Past Surgical History:  Procedure Laterality Date   COLONOSCOPY  2013   COLONOSCOPY WITH PROPOFOL  N/A 03/21/2016   Procedure: COLONOSCOPY WITH PROPOFOL ;  Surgeon: Lamar ONEIDA Holmes, MD;  Location: [facility] ENDOSCOPY;  Service: Endoscopy;  Laterality: N/A;   COLONOSCOPY WITH PROPOFOL  N/A 03/09/2018   Procedure: COLONOSCOPY WITH PROPOFOL ;  Surgeon: Holmes Lamar ONEIDA, MD;  Location: [facility] ENDOSCOPY;  Service: Endoscopy;  Laterality: N/A;   ESOPHAGOGASTRODUODENOSCOPY     ESOPHAGOGASTRODUODENOSCOPY N/A 03/09/2018   Procedure: ESOPHAGOGASTRODUODENOSCOPY (EGD);  Surgeon: Holmes Lamar ONEIDA, MD;  Location: [facility] ENDOSCOPY;  Service: Endoscopy;  Laterality: N/A;   HERNIA REPAIR      HOLEP-LASER ENUCLEATION OF THE PROSTATE WITH MORCELLATION N/A 05/10/2019   Procedure: HOLEP-LASER ENUCLEATION OF THE PROSTATE WITH MORCELLATION;  Surgeon: Francisca Redell BROCKS, MD;  Location: [facility] ORS;  Service: Urology;  Laterality: N/A;   INGUINAL HERNIA REPAIR Right 08/01/2014   Procedure: RIGHT INGUINAL HERNIA  REPAIR WITH MESH ;  Surgeon: Reyes LELON Cota, MD;  Location: [facility] ORS;  Service: General;  Laterality: Right; with large Ultra Pro mesh   NASAL SINUS SURGERY     ROTATOR CUFF REPAIR     TRIGGER FINGER RELEASE     Patient Active Problem List   Diagnosis Date Noted   Postprocedural stricture of urethra at fossa navicularis 07/09/2019   BPH with obstruction/lower urinary tract symptoms 08/21/2018   Restless leg syndrome 07/24/2018   Reactive airway disease 07/24/2018   Pure hypercholesterolemia 07/24/2018   Mechanical back pain 07/24/2018   History of Helicobacter pylori infection 07/24/2018   Essential tremor 07/24/2018   Depression 07/24/2018   BPH associated with nocturia 07/24/2018   Benign essential hypertension 07/24/2018   Anxiety 07/24/2018   Trigger finger of left thumb 04/14/2017   Hand pain, left 04/14/2017   Visual disturbance 07/18/2016   Tremor 07/18/2016   Imbalance 07/18/2016   Dizziness 07/18/2016   Right inguinal hernia 07/12/2014    PCP: Auston,  Reyes BIRCH, MD  REFERRING PROVIDER: Auston Reyes BIRCH, MD   REFERRING DIAG:  R26.89 (ICD-10-CM) - Other abnormalities of gait and mobility  R42 (ICD-10-CM) - Dizziness and giddiness   RATIONALE FOR EVALUATION AND TREATMENT: Rehabilitation  THERAPY DIAG: Unsteadiness on feet  Muscle weakness (generalized)  ONSET DATE: Multiple years  FOLLOW-UP APPT SCHEDULED WITH REFERRING PROVIDER: Yes, neurology;  FROM INITIAL EVALUATION SUBJECTIVE:                                                                                                                                                                                          SUBJECTIVE STATEMENT:  Unsteadiness  PERTINENT HISTORY:  Patient with worsening gait and balance. Feels he is dragging his feet. Infrequently using assistive device. Improved RLS since starting gabapentin. Intermittent episodes of vertigo with position changes.  He goes to [employer] for exercise 1-2x/wk. Ongoing tremors in bilateral hands, worse with activity or stress. He reports 4 falls with the most recent about 1 week ago. Pt reports that he was stepping backwards when he fell. He couldn't get up and had to call extended family to help him from the ground. His wife was not strong enough to assist. He denies any significant injury from the fall. He was referred to PT for sensory ataxia and it was recommended he use a walker as well as perform balance exercise.  08/02/2022 MR BRAIN W WO MR ORBITS W WO IMPRESSION:  1. Atrophic right optic nerve which may reflect sequela of prior  optic neuritis.  2. Otherwise unremarkable appearance of the orbits with no acute  finding.  3. No acute intracranial pathology.  4. Moderate background chronic small-vessel ischemic change.   12/30/2019 MR BRAIN WO IMPRESSION:  No evidence of recent infarction, hemorrhage, or mass. Mild progression of parenchymal volume loss and probable chronic microvascular ischemic changes since 2018. Small chronic left pontine infarcts.   06/10/16 CAROTID ARTERY ULTRASOUND  IMPRESSION: 1. Minimal bilateral carotid bifurcation atherosclerotic vascular plaque. No flow limiting stenosis. Degree of stenosis less than 50% bilaterally.  2. Vertebrals are patent with antegrade flow.  06/02/16  MRI BRAIN WO CONTRAST  IMPRESSION: Atrophy. Small vessel disease. Evidence for chronic brainstem lacunar infarction. No acute intracranial findings. Chronic pansinusitis. Despite prior surgery, significant fluid accumulation and mucosal thickening can be seen, particularly in the RIGHT greater than LEFT frontoethmoid  regions.    Pain: No Numbness/Tingling: No, but reports some feet burning; Focal Weakness: No Recent changes in overall health/medication: Yes, adjustment of meds Prior history of physical therapy for balance:  No Dominant hand: right Imaging: Yes  Red flags: Positive for basal cell carcinoma,  Negative for abdominal pain, chills/fever, night sweats, nausea, vomiting, unrelenting pain  PRECAUTIONS: Fall  WEIGHT BEARING RESTRICTIONS: No  FALLS: Has patient fallen in last 6 months? Yes. Number of falls 1, Directional pattern for falls: No  Living Environment Lives with: lives with their spouse Lives in: House/apartment Stairs: one level home, 2 steps to enter from the garage with L rail during ascend.  Has following equipment at home: grab bars in walk-in shower, no seat, rollator (uses periodically)   Prior level of function: Independent  Occupational demands: Retired Nutritional therapist: hunting (I can't get into the stand anymore), previously like to play golf but he can no longer bend over to put the ball on the tee;  Patient Goals: Pt would like to improve his balance   OBJECTIVE:   Patient Surveys  ABC: To be completed  Cognition Patient is oriented to person, place, and time.  Recent memory is intact.  Remote memory is intact.  Attention span and concentration are intact.  Expressive speech is intact.  Patient's fund of knowledge is within normal limits for educational level.    Gross Musculoskeletal Assessment Tremor: BUE tremors observed, worsened with stress; Bulk: Normal Tone: Normal  Posture: Mild forward head and rounded shoulders  AROM Deferred  LE MMT: MMT (out of 5) Right  Left   Hip flexion 4 4-  Hip extension    Hip abduction (seated) 5 5  Hip adduction (seated) 5 5  Hip internal rotation    Hip external rotation    Knee flexion 5 5  Knee extension 4+ 4+  Ankle dorsiflexion 4+ 4+  Ankle plantarflexion Active Active  (* = pain;  Blank rows = not tested)  Sensation Grossly intact to light touch throughout bilateral LEs as determined by testing dermatomes L2-S2. Proprioception, stereognosis, and hot/cold testing deferred on this date.  Reflexes R/L Knee Jerk (L3/4): 2+/2+  Ankle Jerk (S1/2): 2+/2+   Cranial Nerves Deferred  Coordination/Cerebellar Deferred  Transfers: Assistive device utilized: None  Sit to stand: Complete Independence Stand to sit: Complete Independence Chair to chair: Complete Independence Floor: Not assessed  Stairs: Level of Assistance: Complete Independence Stair Negotiation Technique: Alternating Pattern  with Bilateral Rails Number of Stairs: 4  Height of Stairs: 6  Comments: Decreased speed but good stability noted with single or bilateral UE support on rails  Gait: Gait pattern: decreased step length- Right, decreased step length- Left, poor foot clearance- Right, and poor foot clearance- Left Distance walked: 100' Assistive device utilized: None Level of assistance: Complete Independence Comments: Pt ambulates with decreased self-selected speed. Decreased step length bilaterally  BPPV TESTS:  Symptoms Duration Intensity Nystagmus  L Dix-Hallpike None   None  R Dix-Hallpike None   None  L Head Roll None   None  R Head Roll None   None  L Sidelying Test      R Sidelying Test      (blank = not tested)  mCTSIB: 5.4s  Functional Outcome Measures  05/30/23 06/07/23 Comments  BERG 49/56    DGI  17/24   FGA     TUG 19.7 seconds    5TSTS 13.0 seconds    6 Minute Walk Test     10 Meter Gait Speed Self-selected: 15.2s = 0.66 m/s; Fastest: 11.8s = 0.85 m/s    (Blank rows = not tested)  Coordination/Cerebellar Testing Finger to Nose: Dysmetria BUE but difficult to assess secondary to tremor; Heel to Shin: WNL Rapid alternating movements: WNL Finger Opposition:  WNL Pronator Drift: Negative   TODAY'S TREATMENT: 06/21/23    SUBJECTIVE: Pt reports that he is doing  well today. No changes since the last therapy session. Denies pertinent pain. Patient reports no falls since last session, but pt does report several stumbles which he reports always catching himself. Pt asked about buying a SPC and using it for long-distance ambulation.    PAIN: Denies   Ther-ex  NuStep L2-6 x 10 mins (5 mins unbilled) for BLE strengthening and warm-up during interval history;  Blazepod side stepping, 6 pods, random light-up, 1 min, Trial 1: 7 hits, Trial 2: 11 hits, Trial 3: 12 hits Cross-body marches with 5# ankle weights (AW) x 3 laps in // bars  Standing exercises with 5# AW: Hip flexion marches x 10 BLE; Hamstring curls x 10 BLE; Hip abduction x 10 BLE; Hip extension x 10 BLE;  Nautilus MidRow #20 x 10 x 5 sec scap squeeze hold   Neuromuscular Re-education  All balance exercises performed in parallel bars and without UE support unless otherwise specified:  Airex balance beam tandem stepping x multiple laps; Airex feet together eyes open/closed x 30s each; Airex feet together eyes open with horizontal and vertical head turns x 30s each;   NOT PERFORMED TODAY:  Airex feet apart eyes open/closed x 30s each; Airex feet apart eyes open with horizontal and vertical head turns x 30s each; Seated clams with manual resistance from therapist x 10 BLE; Seated adductor squeezes with manual resistance from therapist x 10 BLE; Seated alternating LAQ with 5# AW x 10 BLE; Airex balance beam side stepping x multiple laps;  PATIENT EDUCATION:  Education details: Pt educated throughout session about proper posture and technique with exercises. Improved exercise technique, movement at target joints, use of target muscles after min to mod verbal, visual, tactile cues.  Person educated: Patient Education method: Explanation, Demonstration, Verbal cues, and Handouts Education comprehension: verbalized understanding   HOME EXERCISE PROGRAM:  Access Code: [email or web address] URL:  [email or web address] Date: 06/07/2023 Prepared by: Selinda Eck  Exercises - Sit to Stand Without Arm Support  - 1 x daily - 7 x weekly - 3 sets - 10 reps - Heel Raises with Counter Support  - 1 x daily - 7 x weekly - 3 sets - 10 reps - Standing Romberg to 1/2 Tandem Stance  - 1 x daily - 7 x weekly - 3 reps - 30s hold   ASSESSMENT:  CLINICAL IMPRESSION: Pt arrived to session 10 minutes late so time was abbreviated secondary to scheduling. Notable BUE tremor present during today's session. Pt was able to continue using 5 # AW this session to progress strengthening exercises. Mid Row was added to encourage upright posture, increase periscapular strength, and challenge weight acceptance outside of his BOS. No HEP modifications at this time but will assess next week for possible update. Pt encouraged to follow-up as scheduled. Pt would benefit from continued physical therapy services at this time so he can increase his strength, decrease his ambulation falls risk, and improve his balance so he can participate safely in his environment.   OBJECTIVE IMPAIRMENTS: Abnormal gait, decreased balance, difficulty walking, decreased strength, and dizziness.   ACTIVITY LIMITATIONS: standing, stairs, transfers, and caring for others  PARTICIPATION LIMITATIONS: meal prep, cleaning, laundry, shopping, and community activity  PERSONAL FACTORS: Age, Past/current experiences, Time since onset of injury/illness/exacerbation, and 3+ comorbidities: back pain, tremors, anxiety, depression are also affecting patient's functional outcome.   REHAB POTENTIAL: Good  CLINICAL DECISION MAKING:  Unstable/unpredictable  EVALUATION COMPLEXITY: High   GOALS: Goals reviewed with patient? No  SHORT TERM GOALS: Target date: 07/11/2023  Pt will be independent with HEP in order to improve strength and balance in order to decrease fall risk and improve function at home. Baseline:  Goal status:  INITIAL   LONG TERM GOALS: Target date: 08/22/2023   Pt will improve ABC by at least 13% in order to demonstrate clinically significant improvement in balance confidence.  Baseline: 62.2% Goal status: INITIAL  2.  Pt will improve BERG by at least 3 points in order to demonstrate clinically significant improvement in balance.   Baseline: 49/56 Goal status: INITIAL  3.  Pt will decrease TUG to below 14 seconds/decrease in order to demonstrate decreased fall risk.       Baseline: 19.7s Goal status: INITIAL  4. Pt will increase self-selected 21m gait speed to at least 0.80 m/s in order to demonstrate clinically significant improvement in community ambulation.     Baseline: 0.66 m/s Goal status: INITIAL  5. Pt will increase by at least 57m (156ft) in order to demonstrate clinically significant improvement in cardiopulmonary endurance and community ambulation      Baseline: 06/09/23: 916' Goal status: INITIAL  PLAN: PT FREQUENCY: 2x/week  PT DURATION: 8 weeks  PLANNED INTERVENTIONS: Therapeutic exercises, Therapeutic activity, Neuromuscular re-education, Balance training, Gait training, Patient/Family education, Self Care, Joint mobilization, Joint manipulation, Vestibular training, Canalith repositioning, Orthotic/Fit training, DME instructions, Dry Needling, Electrical stimulation, Spinal manipulation, Spinal mobilization, Cryotherapy, Moist heat, Taping, Traction, Ultrasound, Ionotophoresis 4mg /ml Dexamethasone , Manual therapy, and Re-evaluation.  PLAN FOR NEXT SESSION:  progress strength/balance exercises, review/modify HEP as necessary; blaze pod reaching, cone taps, physioball rows, forward and lateral step-ups  Vernell Moats, SPT Jason D Huprich PT, DPT, GCS  Huprich,Jason 06/22/2023, 1:28 PM

## 2023-06-23 ENCOUNTER — Ambulatory Visit

## 2023-06-23 DIAGNOSIS — M6281 Muscle weakness (generalized): Secondary | ICD-10-CM

## 2023-06-23 DIAGNOSIS — R2681 Unsteadiness on feet: Secondary | ICD-10-CM

## 2023-06-23 NOTE — Therapy (Signed)
 OUTPATIENT PHYSICAL THERAPY BALANCE TREATMENT   Patient Name: Wesley Preston. MRN: [id number] DOB:[birth year], 83 y.o., male Today's Date: 06/24/2023  END OF SESSION:  PT End of Session - 06/24/23 2229     Visit Number 6    Number of Visits 25    Date for PT Re-Evaluation 08/22/23    Authorization Type eval: 05/30/23    PT Start Time 0932    PT Stop Time 1019    PT Time Calculation (min) 47 min    Equipment Utilized During Treatment Gait belt    Activity Tolerance Patient tolerated treatment well    Behavior During Therapy WFL for tasks assessed/performed         Past Medical History:  Diagnosis Date   Anxiety    Back pain    BPH (benign prostatic hyperplasia)    Cancer (HCC)    BASAL CELL   Chest pain, non-cardiac    Colon polyps    Complication of anesthesia    anesthesia stays a long time   COPD (chronic obstructive pulmonary disease) (HCC)    Depression    Essential tremor    Essential tremor    Family history of adverse reaction to anesthesia    DAUGHTERS GET NAUSEATED   GERD (gastroesophageal reflux disease)    H/O   Helicobacter pylori (H. pylori) infection    Hyperlipidemia    Neuromuscular disorder (HCC)    TREMORS   Reactive airway disease    Reactive airway disease    Restless leg syndrome    Past Surgical History:  Procedure Laterality Date   COLONOSCOPY  2013   COLONOSCOPY WITH PROPOFOL  N/A 03/21/2016   Procedure: COLONOSCOPY WITH PROPOFOL ;  Surgeon: Lamar ONEIDA Holmes, MD;  Location: [facility] ENDOSCOPY;  Service: Endoscopy;  Laterality: N/A;   COLONOSCOPY WITH PROPOFOL  N/A 03/09/2018   Procedure: COLONOSCOPY WITH PROPOFOL ;  Surgeon: Holmes Lamar ONEIDA, MD;  Location: [facility] ENDOSCOPY;  Service: Endoscopy;  Laterality: N/A;   ESOPHAGOGASTRODUODENOSCOPY     ESOPHAGOGASTRODUODENOSCOPY N/A 03/09/2018   Procedure: ESOPHAGOGASTRODUODENOSCOPY (EGD);  Surgeon: Holmes Lamar ONEIDA, MD;  Location: [facility] ENDOSCOPY;  Service: Endoscopy;  Laterality: N/A;   HERNIA REPAIR      HOLEP-LASER ENUCLEATION OF THE PROSTATE WITH MORCELLATION N/A 05/10/2019   Procedure: HOLEP-LASER ENUCLEATION OF THE PROSTATE WITH MORCELLATION;  Surgeon: Francisca Redell BROCKS, MD;  Location: [facility] ORS;  Service: Urology;  Laterality: N/A;   INGUINAL HERNIA REPAIR Right 08/01/2014   Procedure: RIGHT INGUINAL HERNIA  REPAIR WITH MESH ;  Surgeon: Reyes LELON Cota, MD;  Location: [facility] ORS;  Service: General;  Laterality: Right; with large Ultra Pro mesh   NASAL SINUS SURGERY     ROTATOR CUFF REPAIR     TRIGGER FINGER RELEASE     Patient Active Problem List   Diagnosis Date Noted   Postprocedural stricture of urethra at fossa navicularis 07/09/2019   BPH with obstruction/lower urinary tract symptoms 08/21/2018   Restless leg syndrome 07/24/2018   Reactive airway disease 07/24/2018   Pure hypercholesterolemia 07/24/2018   Mechanical back pain 07/24/2018   History of Helicobacter pylori infection 07/24/2018   Essential tremor 07/24/2018   Depression 07/24/2018   BPH associated with nocturia 07/24/2018   Benign essential hypertension 07/24/2018   Anxiety 07/24/2018   Trigger finger of left thumb 04/14/2017   Hand pain, left 04/14/2017   Visual disturbance 07/18/2016   Tremor 07/18/2016   Imbalance 07/18/2016   Dizziness 07/18/2016   Right inguinal hernia 07/12/2014    PCP: Auston,  Reyes BIRCH, MD  REFERRING PROVIDER: Auston Reyes BIRCH, MD   REFERRING DIAG:  R26.89 (ICD-10-CM) - Other abnormalities of gait and mobility  R42 (ICD-10-CM) - Dizziness and giddiness   RATIONALE FOR EVALUATION AND TREATMENT: Rehabilitation  THERAPY DIAG: Unsteadiness on feet  Muscle weakness (generalized)  ONSET DATE: Multiple years  FOLLOW-UP APPT SCHEDULED WITH REFERRING PROVIDER: Yes, neurology;  FROM INITIAL EVALUATION SUBJECTIVE:                                                                                                                                                                                          SUBJECTIVE STATEMENT:  Unsteadiness  PERTINENT HISTORY:  Patient with worsening gait and balance. Feels he is dragging his feet. Infrequently using assistive device. Improved RLS since starting gabapentin. Intermittent episodes of vertigo with position changes.  He goes to [employer] for exercise 1-2x/wk. Ongoing tremors in bilateral hands, worse with activity or stress. He reports 4 falls with the most recent about 1 week ago. Pt reports that he was stepping backwards when he fell. He couldn't get up and had to call extended family to help him from the ground. His wife was not strong enough to assist. He denies any significant injury from the fall. He was referred to PT for sensory ataxia and it was recommended he use a walker as well as perform balance exercise.  08/02/2022 MR BRAIN W WO MR ORBITS W WO IMPRESSION:  1. Atrophic right optic nerve which may reflect sequela of prior  optic neuritis.  2. Otherwise unremarkable appearance of the orbits with no acute  finding.  3. No acute intracranial pathology.  4. Moderate background chronic small-vessel ischemic change.   12/30/2019 MR BRAIN WO IMPRESSION:  No evidence of recent infarction, hemorrhage, or mass. Mild progression of parenchymal volume loss and probable chronic microvascular ischemic changes since 2018. Small chronic left pontine infarcts.   06/10/16 CAROTID ARTERY ULTRASOUND  IMPRESSION: 1. Minimal bilateral carotid bifurcation atherosclerotic vascular plaque. No flow limiting stenosis. Degree of stenosis less than 50% bilaterally.  2. Vertebrals are patent with antegrade flow.  06/02/16  MRI BRAIN WO CONTRAST  IMPRESSION: Atrophy. Small vessel disease. Evidence for chronic brainstem lacunar infarction. No acute intracranial findings. Chronic pansinusitis. Despite prior surgery, significant fluid accumulation and mucosal thickening can be seen, particularly in the RIGHT greater than LEFT frontoethmoid  regions.    Pain: No Numbness/Tingling: No, but reports some feet burning; Focal Weakness: No Recent changes in overall health/medication: Yes, adjustment of meds Prior history of physical therapy for balance:  No Dominant hand: right Imaging: Yes  Red flags: Positive for basal cell carcinoma,  Negative for abdominal pain, chills/fever, night sweats, nausea, vomiting, unrelenting pain  PRECAUTIONS: Fall  WEIGHT BEARING RESTRICTIONS: No  FALLS: Has patient fallen in last 6 months? Yes. Number of falls 1, Directional pattern for falls: No  Living Environment Lives with: lives with their spouse Lives in: House/apartment Stairs: one level home, 2 steps to enter from the garage with L rail during ascend.  Has following equipment at home: grab bars in walk-in shower, no seat, rollator (uses periodically)   Prior level of function: Independent  Occupational demands: Retired Nutritional therapist: hunting (I can't get into the stand anymore), previously like to play golf but he can no longer bend over to put the ball on the tee;  Patient Goals: Pt would like to improve his balance   OBJECTIVE:   Patient Surveys  ABC: To be completed  Cognition Patient is oriented to person, place, and time.  Recent memory is intact.  Remote memory is intact.  Attention span and concentration are intact.  Expressive speech is intact.  Patient's fund of knowledge is within normal limits for educational level.    Gross Musculoskeletal Assessment Tremor: BUE tremors observed, worsened with stress; Bulk: Normal Tone: Normal  Posture: Mild forward head and rounded shoulders  AROM Deferred  LE MMT: MMT (out of 5) Right  Left   Hip flexion 4 4-  Hip extension    Hip abduction (seated) 5 5  Hip adduction (seated) 5 5  Hip internal rotation    Hip external rotation    Knee flexion 5 5  Knee extension 4+ 4+  Ankle dorsiflexion 4+ 4+  Ankle plantarflexion Active Active  (* = pain;  Blank rows = not tested)  Sensation Grossly intact to light touch throughout bilateral LEs as determined by testing dermatomes L2-S2. Proprioception, stereognosis, and hot/cold testing deferred on this date.  Reflexes R/L Knee Jerk (L3/4): 2+/2+  Ankle Jerk (S1/2): 2+/2+   Cranial Nerves Deferred  Coordination/Cerebellar Deferred  Transfers: Assistive device utilized: None  Sit to stand: Complete Independence Stand to sit: Complete Independence Chair to chair: Complete Independence Floor: Not assessed  Stairs: Level of Assistance: Complete Independence Stair Negotiation Technique: Alternating Pattern  with Bilateral Rails Number of Stairs: 4  Height of Stairs: 6  Comments: Decreased speed but good stability noted with single or bilateral UE support on rails  Gait: Gait pattern: decreased step length- Right, decreased step length- Left, poor foot clearance- Right, and poor foot clearance- Left Distance walked: 100' Assistive device utilized: None Level of assistance: Complete Independence Comments: Pt ambulates with decreased self-selected speed. Decreased step length bilaterally  BPPV TESTS:  Symptoms Duration Intensity Nystagmus  L Dix-Hallpike None   None  R Dix-Hallpike None   None  L Head Roll None   None  R Head Roll None   None  L Sidelying Test      R Sidelying Test      (blank = not tested)  mCTSIB: 5.4s  Functional Outcome Measures  05/30/23 06/07/23 Comments  BERG 49/56    DGI  17/24   FGA     TUG 19.7 seconds    5TSTS 13.0 seconds    6 Minute Walk Test     10 Meter Gait Speed Self-selected: 15.2s = 0.66 m/s; Fastest: 11.8s = 0.85 m/s    (Blank rows = not tested)  Coordination/Cerebellar Testing Finger to Nose: Dysmetria BUE but difficult to assess secondary to tremor; Heel to Shin: WNL Rapid alternating movements: WNL Finger Opposition:  WNL Pronator Drift: Negative   TODAY'S TREATMENT: 06/23/23    SUBJECTIVE: Pt reports that he is doing  well today. No changes since the last therapy session. Denies pertinent pain. Patient reports no falls since last session. No specific questions or concerns.   PAIN: Denies   Ther-ex  NuStep L2-6 x 10 mins (5 mins unbilled) for BLE strengthening and warm-up during interval history;  Standing exercises with 5# AW: Hip flexion marches x 10 BLE; Hamstring curls x 10 BLE; Hip abduction x 10 BLE; Hip extension x 10 BLE;  Nautilus MidRow #30 x 10 x 3 sec scap squeeze hold Nautilus Pallof Press #20 x 2 x 10 x 3 sec scap squeeze hold   Ther-Act All balance exercises performed in parallel bars and without UE support unless otherwise specified: Blazepod balance on AE pad, semicircle reaching outside BOS, 6 pods, random light-up, 1 min, Trial 1: 19 hits, Trial 2: 22 hits, Trial 3: 19 hits  Fishing game reaching outside of BOS with no UE support x1 ea hand;  Cross-body marches with 5# ankle weights (AW) x 3 laps in // bars; STS with #6 chest press throw to rebounder 2 x 10;    NOT PERFORMED TODAY:  Airex feet apart eyes open/closed x 30s each; Airex feet apart eyes open with horizontal and vertical head turns x 30s each; Seated clams with manual resistance from therapist x 10 BLE; Seated adductor squeezes with manual resistance from therapist x 10 BLE; Seated alternating LAQ with 5# AW x 10 BLE; Airex balance beam side stepping x multiple laps; Airex balance beam tandem stepping x multiple laps; Airex feet together eyes open/closed x 30s each; Airex feet together eyes open with horizontal and vertical head turns x 30s each;   PATIENT EDUCATION:  Education details: Pt educated throughout session about proper posture and technique with exercises. Improved exercise technique, movement at target joints, use of target muscles after min to mod verbal, visual, tactile cues.  Person educated: Patient Education method: Explanation, Demonstration, Verbal cues, and Handouts Education  comprehension: verbalized understanding   HOME EXERCISE PROGRAM:  Access Code: [email or web address] URL: [email or web address] Date: 06/07/2023 Prepared by: Selinda Eck  Exercises - Sit to Stand Without Arm Support  - 1 x daily - 7 x weekly - 3 sets - 10 reps - Heel Raises with Counter Support  - 1 x daily - 7 x weekly - 3 sets - 10 reps - Standing Romberg to 1/2 Tandem Stance  - 1 x daily - 7 x weekly - 3 reps - 30s hold   ASSESSMENT:  CLINICAL IMPRESSION: Notable BUE tremor present during today's session. Pt was able to continue using 5 # AW this session to progress strengthening exercises. Mid Row weight was able to be progressed to #30 this session in order to encourage continued upright posture, increase periscapular strength, and challenge weight acceptance outside of his BOS.Fishing game and [place] activity were also added this session to challenge weight acceptance outside of his BOS since the pt feels most unsteady with ADLs outside his BOS. STS with ball toss to rebounder was added for strengthening, promote functional activities, and give external balance perturbations. No HEP modifications at this time. Pt encouraged to follow-up as scheduled. Pt would benefit from continued physical therapy services at this time so he can increase his strength, decrease his ambulation falls risk, and improve his balance so he can participate safely in his environment.   OBJECTIVE IMPAIRMENTS: Abnormal gait, decreased balance, difficulty walking, decreased  strength, and dizziness.   ACTIVITY LIMITATIONS: standing, stairs, transfers, and caring for others  PARTICIPATION LIMITATIONS: meal prep, cleaning, laundry, shopping, and community activity  PERSONAL FACTORS: Age, Past/current experiences, Time since onset of injury/illness/exacerbation, and 3+ comorbidities: back pain, tremors, anxiety, depression are also affecting patient's functional outcome.   REHAB POTENTIAL: Good  CLINICAL  DECISION MAKING: Unstable/unpredictable  EVALUATION COMPLEXITY: High   GOALS: Goals reviewed with patient? No  SHORT TERM GOALS: Target date: 07/11/2023  Pt will be independent with HEP in order to improve strength and balance in order to decrease fall risk and improve function at home. Baseline:  Goal status: INITIAL   LONG TERM GOALS: Target date: 08/22/2023   Pt will improve ABC by at least 13% in order to demonstrate clinically significant improvement in balance confidence.  Baseline: 62.2% Goal status: INITIAL  2.  Pt will improve BERG by at least 3 points in order to demonstrate clinically significant improvement in balance.   Baseline: 49/56 Goal status: INITIAL  3.  Pt will decrease TUG to below 14 seconds/decrease in order to demonstrate decreased fall risk.       Baseline: 19.7s Goal status: INITIAL  4. Pt will increase self-selected 10m gait speed to at least 0.80 m/s in order to demonstrate clinically significant improvement in community ambulation.     Baseline: 0.66 m/s Goal status: INITIAL  5. Pt will increase by at least 60m (146ft) in order to demonstrate clinically significant improvement in cardiopulmonary endurance and community ambulation      Baseline: 06/09/23: 916' Goal status: INITIAL  PLAN: PT FREQUENCY: 2x/week  PT DURATION: 8 weeks  PLANNED INTERVENTIONS: Therapeutic exercises, Therapeutic activity, Neuromuscular re-education, Balance training, Gait training, Patient/Family education, Self Care, Joint mobilization, Joint manipulation, Vestibular training, Canalith repositioning, Orthotic/Fit training, DME instructions, Dry Needling, Electrical stimulation, Spinal manipulation, Spinal mobilization, Cryotherapy, Moist heat, Taping, Traction, Ultrasound, Ionotophoresis 4mg /ml Dexamethasone , Manual therapy, and Re-evaluation.  PLAN FOR NEXT SESSION:  progress strength/balance exercises, review/modify HEP as necessary; blaze pod reaching, cone taps,  physioball rows, forward and lateral step-ups, rebounder balance perturbations  Vernell Moats, SPT Selinda BIRCH Huprich PT, DPT, GCS  Huprich,Jason 06/24/2023, 10:31 PM

## 2023-06-27 ENCOUNTER — Ambulatory Visit

## 2023-06-27 DIAGNOSIS — M6281 Muscle weakness (generalized): Secondary | ICD-10-CM

## 2023-06-27 DIAGNOSIS — R2681 Unsteadiness on feet: Secondary | ICD-10-CM

## 2023-06-27 NOTE — Therapy (Signed)
 OUTPATIENT PHYSICAL THERAPY BALANCE TREATMENT   Patient Name: Wesley Preston. MRN: [id number] DOB:12-15-1940, 83 y.o., male Today's Date: 06/27/2023  END OF SESSION:  PT End of Session - 06/27/23 1157     Visit Number 7    Number of Visits 25    Date for PT Re-Evaluation 08/22/23    Authorization Type eval: 05/30/23    PT Start Time 1156    PT Stop Time 1230    PT Time Calculation (min) 34 min    Equipment Utilized During Treatment Gait belt    Activity Tolerance Patient tolerated treatment well    Behavior During Therapy WFL for tasks assessed/performed         Past Medical History:  Diagnosis Date   Anxiety    Back pain    BPH (benign prostatic hyperplasia)    Cancer (HCC)    BASAL CELL   Chest pain, non-cardiac    Colon polyps    Complication of anesthesia    anesthesia stays a long time   COPD (chronic obstructive pulmonary disease) (HCC)    Depression    Essential tremor    Essential tremor    Family history of adverse reaction to anesthesia    DAUGHTERS GET NAUSEATED   GERD (gastroesophageal reflux disease)    H/O   Helicobacter pylori (H. pylori) infection    Hyperlipidemia    Neuromuscular disorder (HCC)    TREMORS   Reactive airway disease    Reactive airway disease    Restless leg syndrome    Past Surgical History:  Procedure Laterality Date   COLONOSCOPY  2013   COLONOSCOPY WITH PROPOFOL  N/A 03/21/2016   Procedure: COLONOSCOPY WITH PROPOFOL ;  Surgeon: Lamar ONEIDA Holmes, MD;  Location: [facility] ENDOSCOPY;  Service: Endoscopy;  Laterality: N/A;   COLONOSCOPY WITH PROPOFOL  N/A 03/09/2018   Procedure: COLONOSCOPY WITH PROPOFOL ;  Surgeon: Holmes Lamar ONEIDA, MD;  Location: [facility] ENDOSCOPY;  Service: Endoscopy;  Laterality: N/A;   ESOPHAGOGASTRODUODENOSCOPY     ESOPHAGOGASTRODUODENOSCOPY N/A 03/09/2018   Procedure: ESOPHAGOGASTRODUODENOSCOPY (EGD);  Surgeon: Holmes Lamar ONEIDA, MD;  Location: [facility] ENDOSCOPY;  Service: Endoscopy;  Laterality: N/A;   HERNIA REPAIR      HOLEP-LASER ENUCLEATION OF THE PROSTATE WITH MORCELLATION N/A 05/10/2019   Procedure: HOLEP-LASER ENUCLEATION OF THE PROSTATE WITH MORCELLATION;  Surgeon: Francisca Redell BROCKS, MD;  Location: [facility] ORS;  Service: Urology;  Laterality: N/A;   INGUINAL HERNIA REPAIR Right 08/01/2014   Procedure: RIGHT INGUINAL HERNIA  REPAIR WITH MESH ;  Surgeon: Reyes LELON Cota, MD;  Location: [facility] ORS;  Service: General;  Laterality: Right; with large Ultra Pro mesh   NASAL SINUS SURGERY     ROTATOR CUFF REPAIR     TRIGGER FINGER RELEASE     Patient Active Problem List   Diagnosis Date Noted   Postprocedural stricture of urethra at fossa navicularis 07/09/2019   BPH with obstruction/lower urinary tract symptoms 08/21/2018   Restless leg syndrome 07/24/2018   Reactive airway disease 07/24/2018   Pure hypercholesterolemia 07/24/2018   Mechanical back pain 07/24/2018   History of Helicobacter pylori infection 07/24/2018   Essential tremor 07/24/2018   Depression 07/24/2018   BPH associated with nocturia 07/24/2018   Benign essential hypertension 07/24/2018   Anxiety 07/24/2018   Trigger finger of left thumb 04/14/2017   Hand pain, left 04/14/2017   Visual disturbance 07/18/2016   Tremor 07/18/2016   Imbalance 07/18/2016   Dizziness 07/18/2016   Right inguinal hernia 07/12/2014    PCP: Auston,  Reyes BIRCH, MD  REFERRING PROVIDER: Auston Reyes BIRCH, MD   REFERRING DIAG:  R26.89 (ICD-10-CM) - Other abnormalities of gait and mobility  R42 (ICD-10-CM) - Dizziness and giddiness   RATIONALE FOR EVALUATION AND TREATMENT: Rehabilitation  THERAPY DIAG: Unsteadiness on feet  Muscle weakness (generalized)  ONSET DATE: Multiple years  FOLLOW-UP APPT SCHEDULED WITH REFERRING PROVIDER: Yes, neurology;  FROM INITIAL EVALUATION SUBJECTIVE:                                                                                                                                                                                          SUBJECTIVE STATEMENT:  Unsteadiness  PERTINENT HISTORY:  Patient with worsening gait and balance. Feels he is dragging his feet. Infrequently using assistive device. Improved RLS since starting gabapentin. Intermittent episodes of vertigo with position changes.  He goes to [employer] for exercise 1-2x/wk. Ongoing tremors in bilateral hands, worse with activity or stress. He reports 4 falls with the most recent about 1 week ago. Pt reports that he was stepping backwards when he fell. He couldn't get up and had to call extended family to help him from the ground. His wife was not strong enough to assist. He denies any significant injury from the fall. He was referred to PT for sensory ataxia and it was recommended he use a walker as well as perform balance exercise.  08/02/2022 MR BRAIN W WO MR ORBITS W WO IMPRESSION:  1. Atrophic right optic nerve which may reflect sequela of prior  optic neuritis.  2. Otherwise unremarkable appearance of the orbits with no acute  finding.  3. No acute intracranial pathology.  4. Moderate background chronic small-vessel ischemic change.   12/30/2019 MR BRAIN WO IMPRESSION:  No evidence of recent infarction, hemorrhage, or mass. Mild progression of parenchymal volume loss and probable chronic microvascular ischemic changes since 2018. Small chronic left pontine infarcts.   06/10/16 CAROTID ARTERY ULTRASOUND  IMPRESSION: 1. Minimal bilateral carotid bifurcation atherosclerotic vascular plaque. No flow limiting stenosis. Degree of stenosis less than 50% bilaterally.  2. Vertebrals are patent with antegrade flow.  06/02/16  MRI BRAIN WO CONTRAST  IMPRESSION: Atrophy. Small vessel disease. Evidence for chronic brainstem lacunar infarction. No acute intracranial findings. Chronic pansinusitis. Despite prior surgery, significant fluid accumulation and mucosal thickening can be seen, particularly in the RIGHT greater than LEFT frontoethmoid  regions.    Pain: No Numbness/Tingling: No, but reports some feet burning; Focal Weakness: No Recent changes in overall health/medication: Yes, adjustment of meds Prior history of physical therapy for balance:  No Dominant hand: right Imaging: Yes  Red flags: Positive for basal cell carcinoma,  Negative for abdominal pain, chills/fever, night sweats, nausea, vomiting, unrelenting pain  PRECAUTIONS: Fall  WEIGHT BEARING RESTRICTIONS: No  FALLS: Has patient fallen in last 6 months? Yes. Number of falls 1, Directional pattern for falls: No  Living Environment Lives with: lives with their spouse Lives in: House/apartment Stairs: one level home, 2 steps to enter from the garage with L rail during ascend.  Has following equipment at home: grab bars in walk-in shower, no seat, rollator (uses periodically)   Prior level of function: Independent  Occupational demands: Retired Nutritional therapist: hunting (I can't get into the stand anymore), previously like to play golf but he can no longer bend over to put the ball on the tee;  Patient Goals: Pt would like to improve his balance   OBJECTIVE:   Patient Surveys  ABC: To be completed  Cognition Patient is oriented to person, place, and time.  Recent memory is intact.  Remote memory is intact.  Attention span and concentration are intact.  Expressive speech is intact.  Patient's fund of knowledge is within normal limits for educational level.    Gross Musculoskeletal Assessment Tremor: BUE tremors observed, worsened with stress; Bulk: Normal Tone: Normal  Posture: Mild forward head and rounded shoulders  AROM Deferred  LE MMT: MMT (out of 5) Right  Left   Hip flexion 4 4-  Hip extension    Hip abduction (seated) 5 5  Hip adduction (seated) 5 5  Hip internal rotation    Hip external rotation    Knee flexion 5 5  Knee extension 4+ 4+  Ankle dorsiflexion 4+ 4+  Ankle plantarflexion Active Active  (* = pain;  Blank rows = not tested)  Sensation Grossly intact to light touch throughout bilateral LEs as determined by testing dermatomes L2-S2. Proprioception, stereognosis, and hot/cold testing deferred on this date.  Reflexes R/L Knee Jerk (L3/4): 2+/2+  Ankle Jerk (S1/2): 2+/2+   Cranial Nerves Deferred  Coordination/Cerebellar Deferred  Transfers: Assistive device utilized: None  Sit to stand: Complete Independence Stand to sit: Complete Independence Chair to chair: Complete Independence Floor: Not assessed  Stairs: Level of Assistance: Complete Independence Stair Negotiation Technique: Alternating Pattern  with Bilateral Rails Number of Stairs: 4  Height of Stairs: 6  Comments: Decreased speed but good stability noted with single or bilateral UE support on rails  Gait: Gait pattern: decreased step length- Right, decreased step length- Left, poor foot clearance- Right, and poor foot clearance- Left Distance walked: 100' Assistive device utilized: None Level of assistance: Complete Independence Comments: Pt ambulates with decreased self-selected speed. Decreased step length bilaterally  BPPV TESTS:  Symptoms Duration Intensity Nystagmus  L Dix-Hallpike None   None  R Dix-Hallpike None   None  L Head Roll None   None  R Head Roll None   None  L Sidelying Test      R Sidelying Test      (blank = not tested)  mCTSIB: 5.4s  Functional Outcome Measures  05/30/23 06/07/23 Comments  BERG 49/56    DGI  17/24   FGA     TUG 19.7 seconds    5TSTS 13.0 seconds    6 Minute Walk Test     10 Meter Gait Speed Self-selected: 15.2s = 0.66 m/s; Fastest: 11.8s = 0.85 m/s    (Blank rows = not tested)  Coordination/Cerebellar Testing Finger to Nose: Dysmetria BUE but difficult to assess secondary to tremor; Heel to Shin: WNL Rapid alternating movements: WNL Finger Opposition:  WNL Pronator Drift: Negative   TODAY'S TREATMENT: 06/27/23    SUBJECTIVE: Pt reports that he is doing  well today. No changes since the last therapy session. Denies pertinent pain. Patient reports no falls since last session. No specific questions or concerns.   PAIN: Denies   Ther-ex  NuStep L2-6 x 10 mins (4 mins unbilled) for BLE strengthening and warm-up during interval history;  Standing exercises with 5# AW: Hip flexion marches x 10 BLE; Hamstring curls x 10 BLE; Hip abduction x 10 BLE; Hip extension x 10 BLE;   Ther-Act Cross-body marches with 5# ankle weights (AW) x 3 laps in // bars; Golf chipping with ball pick up x 3 bouts x 5 balls; STS with #6 chest press throw to rebounder 2 x 10;    NOT PERFORMED TODAY:  Airex feet apart eyes open/closed x 30s each; Airex feet apart eyes open with horizontal and vertical head turns x 30s each; Seated clams with manual resistance from therapist x 10 BLE; Seated adductor squeezes with manual resistance from therapist x 10 BLE; Seated alternating LAQ with 5# AW x 10 BLE; Airex balance beam side stepping x multiple laps; Airex balance beam tandem stepping x multiple laps; Airex feet together eyes open/closed x 30s each; Airex feet together eyes open with horizontal and vertical head turns x 30s each; All balance exercises performed in parallel bars and without UE support unless otherwise specified: Blazepod balance on AE pad, semicircle reaching outside BOS, 6 pods, random light-up, 1 min, Trial 1: 19 hits, Trial 2: 22 hits, Trial 3: 19 hits; Fishing game reaching outside of BOS with no UE support x1 ea hand;  Nautilus MidRow #30 x 10 x 3 sec scap squeeze hold; Nautilus Pallof Press #20 x 2 x 10 x 3 sec scap squeeze hold;  PATIENT EDUCATION:  Education details: Pt educated throughout session about proper posture and technique with exercises. Improved exercise technique, movement at target joints, use of target muscles after min to mod verbal, visual, tactile cues.  Person educated: Patient Education method: Explanation,  Demonstration, Verbal cues, and Handouts Education comprehension: verbalized understanding   HOME EXERCISE PROGRAM:  Access Code: [email or web address] URL: [email or web address] Date: 06/07/2023 Prepared by: Selinda Eck  Exercises - Sit to Stand Without Arm Support  - 1 x daily - 7 x weekly - 3 sets - 10 reps - Heel Raises with Counter Support  - 1 x daily - 7 x weekly - 3 sets - 10 reps - Standing Romberg to 1/2 Tandem Stance  - 1 x daily - 7 x weekly - 3 reps - 30s hold   ASSESSMENT:  CLINICAL IMPRESSION: Pt arrived 10 minutes late to session today. Notable BUE tremor present during today's session. Pt was able to continue using 5 # AW this session to progress strengthening exercises. Golf activity was also added this session to challenge weight acceptance outside of his BOS since the pt feels most unsteady with ADLs outside his BOS. STS with ball toss to rebounder was continued this session for strengthening, promote functional activities, and give external balance perturbations. No HEP modifications at this time. Pt encouraged to follow-up as scheduled. Pt would benefit from continued physical therapy services at this time so he can increase his strength, decrease his ambulation falls risk, and improve his balance so he can participate safely in his environment.   OBJECTIVE IMPAIRMENTS: Abnormal gait, decreased balance, difficulty walking, decreased strength, and dizziness.   ACTIVITY LIMITATIONS: standing, stairs, transfers, and caring for  others  PARTICIPATION LIMITATIONS: meal prep, cleaning, laundry, shopping, and community activity  PERSONAL FACTORS: Age, Past/current experiences, Time since onset of injury/illness/exacerbation, and 3+ comorbidities: back pain, tremors, anxiety, depression are also affecting patient's functional outcome.   REHAB POTENTIAL: Good  CLINICAL DECISION MAKING: Unstable/unpredictable  EVALUATION COMPLEXITY: High   GOALS: Goals reviewed with  patient? No  SHORT TERM GOALS: Target date: 07/11/2023  Pt will be independent with HEP in order to improve strength and balance in order to decrease fall risk and improve function at home. Baseline:  Goal status: INITIAL   LONG TERM GOALS: Target date: 08/22/2023   Pt will improve ABC by at least 13% in order to demonstrate clinically significant improvement in balance confidence.  Baseline: 62.2% Goal status: INITIAL  2.  Pt will improve BERG by at least 3 points in order to demonstrate clinically significant improvement in balance.   Baseline: 49/56 Goal status: INITIAL  3.  Pt will decrease TUG to below 14 seconds/decrease in order to demonstrate decreased fall risk.       Baseline: 19.7s Goal status: INITIAL  4. Pt will increase self-selected 9m gait speed to at least 0.80 m/s in order to demonstrate clinically significant improvement in community ambulation.     Baseline: 0.66 m/s Goal status: INITIAL  5. Pt will increase by at least 51m (15ft) in order to demonstrate clinically significant improvement in cardiopulmonary endurance and community ambulation      Baseline: 06/09/23: 916' Goal status: INITIAL  PLAN: PT FREQUENCY: 2x/week  PT DURATION: 8 weeks  PLANNED INTERVENTIONS: Therapeutic exercises, Therapeutic activity, Neuromuscular re-education, Balance training, Gait training, Patient/Family education, Self Care, Joint mobilization, Joint manipulation, Vestibular training, Canalith repositioning, Orthotic/Fit training, DME instructions, Dry Needling, Electrical stimulation, Spinal manipulation, Spinal mobilization, Cryotherapy, Moist heat, Taping, Traction, Ultrasound, Ionotophoresis 4mg /ml Dexamethasone , Manual therapy, and Re-evaluation.  PLAN FOR NEXT SESSION:  progress strength/balance exercises, review/modify HEP as necessary; blaze pod reaching, cone taps, physioball rows, forward and lateral step-ups, rebounder balance perturbations  Vernell Moats,  SPT Jason D Huprich PT, DPT, GCS  Huprich,Jason 06/27/2023, 1:33 PM

## 2023-06-30 ENCOUNTER — Ambulatory Visit

## 2023-06-30 DIAGNOSIS — R2681 Unsteadiness on feet: Secondary | ICD-10-CM

## 2023-06-30 DIAGNOSIS — M6281 Muscle weakness (generalized): Secondary | ICD-10-CM

## 2023-06-30 NOTE — Therapy (Signed)
 OUTPATIENT PHYSICAL THERAPY BALANCE TREATMENT   Patient Name: Wesley Preston. MRN: [id number] DOB:02/20/1940, 83 y.o., male Today's Date: 06/30/2023  END OF SESSION:  PT End of Session - 06/30/23 0937     Visit Number 8    Number of Visits 25    Date for PT Re-Evaluation 08/22/23    Authorization Type eval: 05/30/23    PT Start Time 0935    PT Stop Time 1022    PT Time Calculation (min) 47 min    Equipment Utilized During Treatment Gait belt    Activity Tolerance Patient tolerated treatment well    Behavior During Therapy WFL for tasks assessed/performed         Past Medical History:  Diagnosis Date   Anxiety    Back pain    BPH (benign prostatic hyperplasia)    Cancer (HCC)    BASAL CELL   Chest pain, non-cardiac    Colon polyps    Complication of anesthesia    anesthesia stays a long time   COPD (chronic obstructive pulmonary disease) (HCC)    Depression    Essential tremor    Essential tremor    Family history of adverse reaction to anesthesia    DAUGHTERS GET NAUSEATED   GERD (gastroesophageal reflux disease)    H/O   Helicobacter pylori (H. pylori) infection    Hyperlipidemia    Neuromuscular disorder (HCC)    TREMORS   Reactive airway disease    Reactive airway disease    Restless leg syndrome    Past Surgical History:  Procedure Laterality Date   COLONOSCOPY  2013   COLONOSCOPY WITH PROPOFOL  N/A 03/21/2016   Procedure: COLONOSCOPY WITH PROPOFOL ;  Surgeon: Lamar ONEIDA Holmes, MD;  Location: [facility] ENDOSCOPY;  Service: Endoscopy;  Laterality: N/A;   COLONOSCOPY WITH PROPOFOL  N/A 03/09/2018   Procedure: COLONOSCOPY WITH PROPOFOL ;  Surgeon: Holmes Lamar ONEIDA, MD;  Location: [facility] ENDOSCOPY;  Service: Endoscopy;  Laterality: N/A;   ESOPHAGOGASTRODUODENOSCOPY     ESOPHAGOGASTRODUODENOSCOPY N/A 03/09/2018   Procedure: ESOPHAGOGASTRODUODENOSCOPY (EGD);  Surgeon: Holmes Lamar ONEIDA, MD;  Location: [facility] ENDOSCOPY;  Service: Endoscopy;  Laterality: N/A;   HERNIA REPAIR      HOLEP-LASER ENUCLEATION OF THE PROSTATE WITH MORCELLATION N/A 05/10/2019   Procedure: HOLEP-LASER ENUCLEATION OF THE PROSTATE WITH MORCELLATION;  Surgeon: Francisca Redell BROCKS, MD;  Location: [facility] ORS;  Service: Urology;  Laterality: N/A;   INGUINAL HERNIA REPAIR Right 08/01/2014   Procedure: RIGHT INGUINAL HERNIA  REPAIR WITH MESH ;  Surgeon: Reyes LELON Cota, MD;  Location: [facility] ORS;  Service: General;  Laterality: Right; with large Ultra Pro mesh   NASAL SINUS SURGERY     ROTATOR CUFF REPAIR     TRIGGER FINGER RELEASE     Patient Active Problem List   Diagnosis Date Noted   Postprocedural stricture of urethra at fossa navicularis 07/09/2019   BPH with obstruction/lower urinary tract symptoms 08/21/2018   Restless leg syndrome 07/24/2018   Reactive airway disease 07/24/2018   Pure hypercholesterolemia 07/24/2018   Mechanical back pain 07/24/2018   History of Helicobacter pylori infection 07/24/2018   Essential tremor 07/24/2018   Depression 07/24/2018   BPH associated with nocturia 07/24/2018   Benign essential hypertension 07/24/2018   Anxiety 07/24/2018   Trigger finger of left thumb 04/14/2017   Hand pain, left 04/14/2017   Visual disturbance 07/18/2016   Tremor 07/18/2016   Imbalance 07/18/2016   Dizziness 07/18/2016   Right inguinal hernia 07/12/2014    PCP: Auston,  Reyes BIRCH, MD  REFERRING PROVIDER: Auston Reyes BIRCH, MD   REFERRING DIAG:  R26.89 (ICD-10-CM) - Other abnormalities of gait and mobility  R42 (ICD-10-CM) - Dizziness and giddiness   RATIONALE FOR EVALUATION AND TREATMENT: Rehabilitation  THERAPY DIAG: Unsteadiness on feet  Muscle weakness (generalized)  ONSET DATE: Multiple years  FOLLOW-UP APPT SCHEDULED WITH REFERRING PROVIDER: Yes, neurology;  FROM INITIAL EVALUATION SUBJECTIVE:                                                                                                                                                                                          SUBJECTIVE STATEMENT:  Unsteadiness  PERTINENT HISTORY:  Patient with worsening gait and balance. Feels he is dragging his feet. Infrequently using assistive device. Improved RLS since starting gabapentin. Intermittent episodes of vertigo with position changes.  He goes to [employer] for exercise 1-2x/wk. Ongoing tremors in bilateral hands, worse with activity or stress. He reports 4 falls with the most recent about 1 week ago. Pt reports that he was stepping backwards when he fell. He couldn't get up and had to call extended family to help him from the ground. His wife was not strong enough to assist. He denies any significant injury from the fall. He was referred to PT for sensory ataxia and it was recommended he use a walker as well as perform balance exercise.  08/02/2022 MR BRAIN W WO MR ORBITS W WO IMPRESSION:  1. Atrophic right optic nerve which may reflect sequela of prior  optic neuritis.  2. Otherwise unremarkable appearance of the orbits with no acute  finding.  3. No acute intracranial pathology.  4. Moderate background chronic small-vessel ischemic change.   12/30/2019 MR BRAIN WO IMPRESSION:  No evidence of recent infarction, hemorrhage, or mass. Mild progression of parenchymal volume loss and probable chronic microvascular ischemic changes since 2018. Small chronic left pontine infarcts.   06/10/16 CAROTID ARTERY ULTRASOUND  IMPRESSION: 1. Minimal bilateral carotid bifurcation atherosclerotic vascular plaque. No flow limiting stenosis. Degree of stenosis less than 50% bilaterally.  2. Vertebrals are patent with antegrade flow.  06/02/16  MRI BRAIN WO CONTRAST  IMPRESSION: Atrophy. Small vessel disease. Evidence for chronic brainstem lacunar infarction. No acute intracranial findings. Chronic pansinusitis. Despite prior surgery, significant fluid accumulation and mucosal thickening can be seen, particularly in the RIGHT greater than LEFT frontoethmoid  regions.    Pain: No Numbness/Tingling: No, but reports some feet burning; Focal Weakness: No Recent changes in overall health/medication: Yes, adjustment of meds Prior history of physical therapy for balance:  No Dominant hand: right Imaging: Yes  Red flags: Positive for basal cell carcinoma,  Negative for abdominal pain, chills/fever, night sweats, nausea, vomiting, unrelenting pain  PRECAUTIONS: Fall  WEIGHT BEARING RESTRICTIONS: No  FALLS: Has patient fallen in last 6 months? Yes. Number of falls 1, Directional pattern for falls: No  Living Environment Lives with: lives with their spouse Lives in: House/apartment Stairs: one level home, 2 steps to enter from the garage with L rail during ascend.  Has following equipment at home: grab bars in walk-in shower, no seat, rollator (uses periodically)   Prior level of function: Independent  Occupational demands: Retired Nutritional therapist: hunting (I can't get into the stand anymore), previously like to play golf but he can no longer bend over to put the ball on the tee;  Patient Goals: Pt would like to improve his balance   OBJECTIVE:   Patient Surveys  ABC: To be completed  Cognition Patient is oriented to person, place, and time.  Recent memory is intact.  Remote memory is intact.  Attention span and concentration are intact.  Expressive speech is intact.  Patient's fund of knowledge is within normal limits for educational level.    Gross Musculoskeletal Assessment Tremor: BUE tremors observed, worsened with stress; Bulk: Normal Tone: Normal  Posture: Mild forward head and rounded shoulders  AROM Deferred  LE MMT: MMT (out of 5) Right  Left   Hip flexion 4 4-  Hip extension    Hip abduction (seated) 5 5  Hip adduction (seated) 5 5  Hip internal rotation    Hip external rotation    Knee flexion 5 5  Knee extension 4+ 4+  Ankle dorsiflexion 4+ 4+  Ankle plantarflexion Active Active  (* = pain;  Blank rows = not tested)  Sensation Grossly intact to light touch throughout bilateral LEs as determined by testing dermatomes L2-S2. Proprioception, stereognosis, and hot/cold testing deferred on this date.  Reflexes R/L Knee Jerk (L3/4): 2+/2+  Ankle Jerk (S1/2): 2+/2+   Cranial Nerves Deferred  Coordination/Cerebellar Deferred  Transfers: Assistive device utilized: None  Sit to stand: Complete Independence Stand to sit: Complete Independence Chair to chair: Complete Independence Floor: Not assessed  Stairs: Level of Assistance: Complete Independence Stair Negotiation Technique: Alternating Pattern  with Bilateral Rails Number of Stairs: 4  Height of Stairs: 6  Comments: Decreased speed but good stability noted with single or bilateral UE support on rails  Gait: Gait pattern: decreased step length- Right, decreased step length- Left, poor foot clearance- Right, and poor foot clearance- Left Distance walked: 100' Assistive device utilized: None Level of assistance: Complete Independence Comments: Pt ambulates with decreased self-selected speed. Decreased step length bilaterally  BPPV TESTS:  Symptoms Duration Intensity Nystagmus  L Dix-Hallpike None   None  R Dix-Hallpike None   None  L Head Roll None   None  R Head Roll None   None  L Sidelying Test      R Sidelying Test      (blank = not tested)  mCTSIB: 5.4s  Functional Outcome Measures  05/30/23 06/07/23 Comments  BERG 49/56    DGI  17/24   FGA     TUG 19.7 seconds    5TSTS 13.0 seconds    6 Minute Walk Test     10 Meter Gait Speed Self-selected: 15.2s = 0.66 m/s; Fastest: 11.8s = 0.85 m/s    (Blank rows = not tested)  Coordination/Cerebellar Testing Finger to Nose: Dysmetria BUE but difficult to assess secondary to tremor; Heel to Shin: WNL Rapid alternating movements: WNL Finger Opposition:  WNL Pronator Drift: Negative   TODAY'S TREATMENT: 06/30/23    SUBJECTIVE: Pt reports that he is doing  well today. No changes since the last therapy session. Denies pertinent pain. Patient reports no falls since last session. No specific questions or concerns.   PAIN: Denies   Ther-ex  NuStep L2-6 x 10 mins (4 mins unbilled) for BLE strengthening and warm-up during interval history;  Standing exercises with 5# AW: Hip flexion marches x 10 BLE; Hamstring curls x 10 BLE; Hip abduction x 10 BLE; Hip extension x 10 BLE;   Ther-Act Obstacle course in hallway, step ups, balancing, backwards and forwards walking 4 x 50 ft  Cross-body marches with 5# ankle weights (AW) x 3 laps in // bars; STS with #6 chest press throw to rebounder 2 x 10;    NOT PERFORMED TODAY:  Airex feet apart eyes open/closed x 30s each; Airex feet apart eyes open with horizontal and vertical head turns x 30s each; Seated clams with manual resistance from therapist x 10 BLE; Seated adductor squeezes with manual resistance from therapist x 10 BLE; Seated alternating LAQ with 5# AW x 10 BLE; Airex balance beam side stepping x multiple laps; Airex balance beam tandem stepping x multiple laps; Airex feet together eyes open/closed x 30s each; Airex feet together eyes open with horizontal and vertical head turns x 30s each; All balance exercises performed in parallel bars and without UE support unless otherwise specified: Blazepod balance on AE pad, semicircle reaching outside BOS, 6 pods, random light-up, 1 min, Trial 1: 19 hits, Trial 2: 22 hits, Trial 3: 19 hits; Fishing game reaching outside of BOS with no UE support x1 ea hand;  Nautilus MidRow #30 x 10 x 3 sec scap squeeze hold; Nautilus Pallof Press #20 x 2 x 10 x 3 sec scap squeeze hold; Golf chipping with ball pick up x 3 bouts x 5 balls;  PATIENT EDUCATION:  Education details: Pt educated throughout session about proper posture and technique with exercises. Improved exercise technique, movement at target joints, use of target muscles after min to mod verbal,  visual, tactile cues.  Person educated: Patient Education method: Explanation, Demonstration, Verbal cues, and Handouts Education comprehension: verbalized understanding   HOME EXERCISE PROGRAM:  Access Code: [email or web address] URL: [email or web address] Date: 06/07/2023 Prepared by: Selinda Eck  Exercises - Sit to Stand Without Arm Support  - 1 x daily - 7 x weekly - 3 sets - 10 reps - Heel Raises with Counter Support  - 1 x daily - 7 x weekly - 3 sets - 10 reps - Standing Romberg to 1/2 Tandem Stance  - 1 x daily - 7 x weekly - 3 reps - 30s hold   ASSESSMENT:  CLINICAL IMPRESSION: Notable BUE tremor present during today's session. Pt was able to continue using 5 # AW this session to progress strengthening exercises. Obstacle course was also added this session to challenge his ambulation. Obstacle course included backwards walking, picking objects off the ground, stepping over hurdles, balancing on the AE, and cone navigating with side stepping. STS with ball toss to rebounder was continued this session for strengthening, promote functional activities, and give external balance perturbations. No HEP modifications at this time. Pt encouraged to follow-up as scheduled. Pt would benefit from continued physical therapy services at this time so he can increase his strength, decrease his ambulation falls risk, and improve his balance so he can participate safely in his environment.   OBJECTIVE IMPAIRMENTS: Abnormal gait, decreased balance,  difficulty walking, decreased strength, and dizziness.   ACTIVITY LIMITATIONS: standing, stairs, transfers, and caring for others  PARTICIPATION LIMITATIONS: meal prep, cleaning, laundry, shopping, and community activity  PERSONAL FACTORS: Age, Past/current experiences, Time since onset of injury/illness/exacerbation, and 3+ comorbidities: back pain, tremors, anxiety, depression are also affecting patient's functional outcome.   REHAB POTENTIAL:  Good  CLINICAL DECISION MAKING: Unstable/unpredictable  EVALUATION COMPLEXITY: High   GOALS: Goals reviewed with patient? No  SHORT TERM GOALS: Target date: 07/11/2023  Pt will be independent with HEP in order to improve strength and balance in order to decrease fall risk and improve function at home. Baseline:  Goal status: INITIAL   LONG TERM GOALS: Target date: 08/22/2023   Pt will improve ABC by at least 13% in order to demonstrate clinically significant improvement in balance confidence.  Baseline: 62.2% Goal status: INITIAL  2.  Pt will improve BERG by at least 3 points in order to demonstrate clinically significant improvement in balance.   Baseline: 49/56 Goal status: INITIAL  3.  Pt will decrease TUG to below 14 seconds/decrease in order to demonstrate decreased fall risk.       Baseline: 19.7s Goal status: INITIAL  4. Pt will increase self-selected 50m gait speed to at least 0.80 m/s in order to demonstrate clinically significant improvement in community ambulation.     Baseline: 0.66 m/s Goal status: INITIAL  5. Pt will increase by at least 56m (116ft) in order to demonstrate clinically significant improvement in cardiopulmonary endurance and community ambulation      Baseline: 06/09/23: 916' Goal status: INITIAL  PLAN: PT FREQUENCY: 2x/week  PT DURATION: 8 weeks  PLANNED INTERVENTIONS: Therapeutic exercises, Therapeutic activity, Neuromuscular re-education, Balance training, Gait training, Patient/Family education, Self Care, Joint mobilization, Joint manipulation, Vestibular training, Canalith repositioning, Orthotic/Fit training, DME instructions, Dry Needling, Electrical stimulation, Spinal manipulation, Spinal mobilization, Cryotherapy, Moist heat, Taping, Traction, Ultrasound, Ionotophoresis 4mg /ml Dexamethasone , Manual therapy, and Re-evaluation.  PLAN FOR NEXT SESSION:  progress strength/balance exercises, review/modify HEP as necessary; blaze pod  reaching, cone taps, physioball rows, forward and lateral step-ups, rebounder balance perturbations  Vernell Moats, SPT Selinda BIRCH Huprich PT, DPT, GCS  Huprich,Jason 06/30/2023, 1:06 PM

## 2023-07-03 ENCOUNTER — Ambulatory Visit

## 2023-07-03 ENCOUNTER — Encounter

## 2023-07-03 DIAGNOSIS — M6281 Muscle weakness (generalized): Secondary | ICD-10-CM

## 2023-07-03 DIAGNOSIS — R2681 Unsteadiness on feet: Secondary | ICD-10-CM | POA: Diagnosis not present

## 2023-07-03 NOTE — Therapy (Signed)
 OUTPATIENT PHYSICAL THERAPY BALANCE TREATMENT  Patient Name: Wesley Preston. MRN: [id number] DOB:[birth year], 83 y.o., male Today's Date: 07/03/2023  END OF SESSION:  PT End of Session - 07/03/23 1416     Visit Number 9    Number of Visits 25    Date for PT Re-Evaluation 08/22/23    Authorization Type eval: 05/30/23    PT Start Time 1415    Equipment Utilized During Treatment Gait belt    Activity Tolerance Patient tolerated treatment well    Behavior During Therapy WFL for tasks assessed/performed          Past Medical History:  Diagnosis Date   Anxiety    Back pain    BPH (benign prostatic hyperplasia)    Cancer (HCC)    BASAL CELL   Chest pain, non-cardiac    Colon polyps    Complication of anesthesia    anesthesia stays a long time   COPD (chronic obstructive pulmonary disease) (HCC)    Depression    Essential tremor    Essential tremor    Family history of adverse reaction to anesthesia    DAUGHTERS GET NAUSEATED   GERD (gastroesophageal reflux disease)    H/O   Helicobacter pylori (H. pylori) infection    Hyperlipidemia    Neuromuscular disorder (HCC)    TREMORS   Reactive airway disease    Reactive airway disease    Restless leg syndrome    Past Surgical History:  Procedure Laterality Date   COLONOSCOPY  2013   COLONOSCOPY WITH PROPOFOL  N/A 03/21/2016   Procedure: COLONOSCOPY WITH PROPOFOL ;  Surgeon: Lamar ONEIDA Holmes, MD;  Location: [facility] ENDOSCOPY;  Service: Endoscopy;  Laterality: N/A;   COLONOSCOPY WITH PROPOFOL  N/A 03/09/2018   Procedure: COLONOSCOPY WITH PROPOFOL ;  Surgeon: Holmes Lamar ONEIDA, MD;  Location[facility] ENDOSCOPY;  Service: Endoscopy;  Laterality: N/A;   ESOPHAGOGASTRODUODENOSCOPY     ESOPHAGOGASTRODUODENOSCOPY N/A 03/09/2018   Procedure: ESOPHAGOGASTRODUODENOSCOPY (EGD);  Surgeon: Holmes Lamar ONEIDA, MD;  Location: [facility] ENDOSCOPY;  Service: Endoscopy;  Laterality: N/A;   HERNIA REPAIR     HOLEP-LASER ENUCLEATION OF THE PROSTATE WITH  MORCELLATION N/A 05/10/2019   Procedure: HOLEP-LASER ENUCLEATION OF THE PROSTATE WITH MORCELLATION;  Surgeon: Francisca Redell BROCKS, MD;  Location: [facility] ORS;  Service: Urology;  Laterality: N/A;   INGUINAL HERNIA REPAIR Right 08/01/2014   Procedure: RIGHT INGUINAL HERNIA  REPAIR WITH MESH ;  Surgeon: Reyes LELON Cota, MD;  Location: [facility] ORS;  Service: General;  Laterality: Right; with large Ultra Pro mesh   NASAL SINUS SURGERY     ROTATOR CUFF REPAIR     TRIGGER FINGER RELEASE     Patient Active Problem List   Diagnosis Date Noted   Postprocedural stricture of urethra at fossa navicularis 07/09/2019   BPH with obstruction/lower urinary tract symptoms 08/21/2018   Restless leg syndrome 07/24/2018   Reactive airway disease 07/24/2018   Pure hypercholesterolemia 07/24/2018   Mechanical back pain 07/24/2018   History of Helicobacter pylori infection 07/24/2018   Essential tremor 07/24/2018   Depression 07/24/2018   BPH associated with nocturia 07/24/2018   Benign essential hypertension 07/24/2018   Anxiety 07/24/2018   Trigger finger of left thumb 04/14/2017   Hand pain, left 04/14/2017   Visual disturbance 07/18/2016   Tremor 07/18/2016   Imbalance 07/18/2016   Dizziness 07/18/2016   Right inguinal hernia 07/12/2014    PCP: Auston Reyes BIRCH, MD  REFERRING PROVIDER: Auston Reyes BIRCH, MD   REFERRING DIAG:  R26.89 (  ICD-10-CM) - Other abnormalities of gait and mobility  R42 (ICD-10-CM) - Dizziness and giddiness   RATIONALE FOR EVALUATION AND TREATMENT: Rehabilitation  THERAPY DIAG: Unsteadiness on feet  Muscle weakness (generalized)  ONSET DATE: Multiple years  FOLLOW-UP APPT SCHEDULED WITH REFERRING PROVIDER: Yes, neurology;  FROM INITIAL EVALUATION SUBJECTIVE:                                                                                                                                                                                         SUBJECTIVE STATEMENT:   Unsteadiness  PERTINENT HISTORY:  Patient with worsening gait and balance. Feels he is dragging his feet. Infrequently using assistive device. Improved RLS since starting gabapentin. Intermittent episodes of vertigo with position changes.  He goes to [employer] for exercise 1-2x/wk. Ongoing tremors in bilateral hands, worse with activity or stress. He reports 4 falls with the most recent about 1 week ago. Pt reports that he was stepping backwards when he fell. He couldn't get up and had to call extended family to help him from the ground. His wife was not strong enough to assist. He denies any significant injury from the fall. He was referred to PT for sensory ataxia and it was recommended he use a walker as well as perform balance exercise.  08/02/2022 MR BRAIN W WO MR ORBITS W WO IMPRESSION:  1. Atrophic right optic nerve which may reflect sequela of prior  optic neuritis.  2. Otherwise unremarkable appearance of the orbits with no acute  finding.  3. No acute intracranial pathology.  4. Moderate background chronic small-vessel ischemic change.   12/30/2019 MR BRAIN WO IMPRESSION:  No evidence of recent infarction, hemorrhage, or mass. Mild progression of parenchymal volume loss and probable chronic microvascular ischemic changes since 2018. Small chronic left pontine infarcts.   06/10/16 CAROTID ARTERY ULTRASOUND  IMPRESSION: 1. Minimal bilateral carotid bifurcation atherosclerotic vascular plaque. No flow limiting stenosis. Degree of stenosis less than 50% bilaterally.  2. Vertebrals are patent with antegrade flow.  06/02/16  MRI BRAIN WO CONTRAST  IMPRESSION: Atrophy. Small vessel disease. Evidence for chronic brainstem lacunar infarction. No acute intracranial findings. Chronic pansinusitis. Despite prior surgery, significant fluid accumulation and mucosal thickening can be seen, particularly in the RIGHT greater than LEFT frontoethmoid regions.    Pain:  No Numbness/Tingling: No, but reports some feet burning; Focal Weakness: No Recent changes in overall health/medication: Yes, adjustment of meds Prior history of physical therapy for balance:  No Dominant hand: right Imaging: Yes  Red flags: Positive for basal cell carcinoma, Negative for abdominal pain, chills/fever, night sweats, nausea, vomiting, unrelenting pain  PRECAUTIONS: Fall  WEIGHT  BEARING RESTRICTIONS: No  FALLS: Has patient fallen in last 6 months? Yes. Number of falls 1, Directional pattern for falls: No  Living Environment Lives with: lives with their spouse Lives in: House/apartment Stairs: one level home, 2 steps to enter from the garage with L rail during ascend.  Has following equipment at home: grab bars in walk-in shower, no seat, rollator (uses periodically)   Prior level of function: Independent  Occupational demands: Retired Nutritional therapist: hunting (I can't get into the stand anymore), previously like to play golf but he can no longer bend over to put the ball on the tee;  Patient Goals: Pt would like to improve his balance   OBJECTIVE:   Patient Surveys  ABC: To be completed  Cognition Patient is oriented to person, place, and time.  Recent memory is intact.  Remote memory is intact.  Attention span and concentration are intact.  Expressive speech is intact.  Patient's fund of knowledge is within normal limits for educational level.    Gross Musculoskeletal Assessment Tremor: BUE tremors observed, worsened with stress; Bulk: Normal Tone: Normal  Posture: Mild forward head and rounded shoulders  AROM Deferred  LE MMT: MMT (out of 5) Right  Left   Hip flexion 4 4-  Hip extension    Hip abduction (seated) 5 5  Hip adduction (seated) 5 5  Hip internal rotation    Hip external rotation    Knee flexion 5 5  Knee extension 4+ 4+  Ankle dorsiflexion 4+ 4+  Ankle plantarflexion Active Active  (* = pain; Blank rows = not  tested)  Sensation Grossly intact to light touch throughout bilateral LEs as determined by testing dermatomes L2-S2. Proprioception, stereognosis, and hot/cold testing deferred on this date.  Reflexes R/L Knee Jerk (L3/4): 2+/2+  Ankle Jerk (S1/2): 2+/2+   Cranial Nerves Deferred  Coordination/Cerebellar Deferred  Transfers: Assistive device utilized: None  Sit to stand: Complete Independence Stand to sit: Complete Independence Chair to chair: Complete Independence Floor: Not assessed  Stairs: Level of Assistance: Complete Independence Stair Negotiation Technique: Alternating Pattern  with Bilateral Rails Number of Stairs: 4  Height of Stairs: 6  Comments: Decreased speed but good stability noted with single or bilateral UE support on rails  Gait: Gait pattern: decreased step length- Right, decreased step length- Left, poor foot clearance- Right, and poor foot clearance- Left Distance walked: 100' Assistive device utilized: None Level of assistance: Complete Independence Comments: Pt ambulates with decreased self-selected speed. Decreased step length bilaterally  BPPV TESTS:  Symptoms Duration Intensity Nystagmus  L Dix-Hallpike None   None  R Dix-Hallpike None   None  L Head Roll None   None  R Head Roll None   None  L Sidelying Test      R Sidelying Test      (blank = not tested)  mCTSIB: 5.4s  Functional Outcome Measures  05/30/23 06/07/23 Comments  BERG 49/56    DGI  17/24   FGA     TUG 19.7 seconds    5TSTS 13.0 seconds    6 Minute Walk Test     10 Meter Gait Speed Self-selected: 15.2s = 0.66 m/s; Fastest: 11.8s = 0.85 m/s    (Blank rows = not tested)  Coordination/Cerebellar Testing Finger to Nose: Dysmetria BUE but difficult to assess secondary to tremor; Heel to Shin: WNL Rapid alternating movements: WNL Finger Opposition: WNL Pronator Drift: Negative   TODAY'S TREATMENT: 07/03/23   SUBJECTIVE: Pt reports that he  is doing well today. No  changes since the last therapy session. Denies pertinent pain. Patient reports no falls since last session. No specific questions or concerns.   PAIN: Denies  Ther-ex  NuStep L5 x 10 mins (4 mins unbilled) for BLE strengthening and warm-up during interval history; Seated adductor ball squeezes 2 x 10 x 5 BLE;  Standing exercises with 5# AW: Hip flexion marches x 20 BLE; Hamstring curls x 20 BLE; Hip abduction x 20 BLE; Hip extension x 20 BLE;  Ther-Act Obstacle course in hallway, step ups, balancing, backwards and forwards walking 4 x 50 ft  Backwards walking in hallway with cone pick-ups off floor 4 x 50 ft  Forwards walking with ball toss to SPT outside of BOS 3 x 50 ft  Cross-body marches with 5# ankle weights (AW) x 3 laps in // bars;    NOT PERFORMED TODAY:  Airex feet apart eyes open/closed x 30s each; Airex feet apart eyes open with horizontal and vertical head turns x 30s each; Seated clams with manual resistance from therapist x 10 BLE; Seated alternating LAQ with 5# AW x 10 BLE; Airex balance beam side stepping x multiple laps; Airex balance beam tandem stepping x multiple laps; Airex feet together eyes open/closed x 30s each; Airex feet together eyes open with horizontal and vertical head turns x 30s each; All balance exercises performed in parallel bars and without UE support unless otherwise specified: Fishing game reaching outside of BOS with no UE support x1 ea hand;  Nautilus MidRow #30 x 10 x 3 sec scap squeeze hold; Nautilus Pallof Press #20 x 2 x 10 x 3 sec scap squeeze hold; Golf chipping with ball pick up x 3 bouts x 5 balls; Blazepod balance on AE pad, semicircle reaching outside BOS, 6 pods, random light-up, 1 min, Trial 1: 19 hits, Trial 2: 22 hits, Trial 3: 19 hits; STS with #6 chest press throw to rebounder 2 x 10;   PATIENT EDUCATION:  Education details: Pt educated throughout session about proper posture and technique with exercises. Improved  exercise technique, movement at target joints, use of target muscles after min to mod verbal, visual, tactile cues.  Person educated: Patient Education method: Explanation, Demonstration, Verbal cues, and Handouts Education comprehension: verbalized understanding   HOME EXERCISE PROGRAM:  Access Code: [email or web address] URL: [email or web address] Date: 06/07/2023 Prepared by: Selinda Eck  Exercises - Sit to Stand Without Arm Support  - 1 x daily - 7 x weekly - 3 sets - 10 reps - Heel Raises with Counter Support  - 1 x daily - 7 x weekly - 3 sets - 10 reps - Standing Romberg to 1/2 Tandem Stance  - 1 x daily - 7 x weekly - 3 reps - 30s hold  ASSESSMENT:  CLINICAL IMPRESSION: Pt. arrived 15 minutes late to session today.  Notable BUE tremor present during today's session. Pt was able to continue using 5 # AW this session to progress strengthening exercises. Obstacle course was continued this session to challenge his ambulation. Obstacle course included backwards walking, picking objects off the ground, stepping over hurdles, balancing on the AE, and cone navigating with side stepping. Walking with ball toss was added to promote visual tracking and multitasking during ambulation. No HEP modifications at this time. Pt encouraged to follow-up as scheduled. Pt would benefit from continued physical therapy services at this time so he can increase his strength, decrease his ambulation falls risk, and improve his balance so he can participate safely  in his environment.   OBJECTIVE IMPAIRMENTS: Abnormal gait, decreased balance, difficulty walking, decreased strength, and dizziness.   ACTIVITY LIMITATIONS: standing, stairs, transfers, and caring for others  PARTICIPATION LIMITATIONS: meal prep, cleaning, laundry, shopping, and community activity  PERSONAL FACTORS: Age, Past/current experiences, Time since onset of injury/illness/exacerbation, and 3+ comorbidities: back pain, tremors, anxiety,  depression are also affecting patient's functional outcome.   REHAB POTENTIAL: Good  CLINICAL DECISION MAKING: Unstable/unpredictable  EVALUATION COMPLEXITY: High   GOALS: Goals reviewed with patient? No  SHORT TERM GOALS: Target date: 07/11/2023  Pt will be independent with HEP in order to improve strength and balance in order to decrease fall risk and improve function at home. Baseline:  Goal status: INITIAL   LONG TERM GOALS: Target date: 08/22/2023   Pt will improve ABC by at least 13% in order to demonstrate clinically significant improvement in balance confidence.  Baseline: 62.2% Goal status: INITIAL  2.  Pt will improve BERG by at least 3 points in order to demonstrate clinically significant improvement in balance.   Baseline: 49/56 Goal status: INITIAL  3.  Pt will decrease TUG to below 14 seconds/decrease in order to demonstrate decreased fall risk.       Baseline: 19.7s Goal status: INITIAL  4. Pt will increase self-selected 64m gait speed to at least 0.80 m/s in order to demonstrate clinically significant improvement in community ambulation.     Baseline: 0.66 m/s Goal status: INITIAL  5. Pt will increase by at least 30m (156ft) in order to demonstrate clinically significant improvement in cardiopulmonary endurance and community ambulation      Baseline: 06/09/23: 916' Goal status: INITIAL  PLAN: PT FREQUENCY: 2x/week  PT DURATION: 8 weeks  PLANNED INTERVENTIONS: Therapeutic exercises, Therapeutic activity, Neuromuscular re-education, Balance training, Gait training, Patient/Family education, Self Care, Joint mobilization, Joint manipulation, Vestibular training, Canalith repositioning, Orthotic/Fit training, DME instructions, Dry Needling, Electrical stimulation, Spinal manipulation, Spinal mobilization, Cryotherapy, Moist heat, Taping, Traction, Ultrasound, Ionotophoresis 4mg /ml Dexamethasone , Manual therapy, and Re-evaluation.  PLAN FOR NEXT SESSION:   10th VISIT PROGRESS NOTE! progress strength/balance exercises, review/modify HEP as necessary; blaze pod reaching, cone taps, physioball rows, forward and lateral step-ups, rebounder balance perturbations  Vernell Moats, SPT Ozell JAYSON Sero, PT, DPT # [phone] 07/03/2023, 2:17 PM

## 2023-07-05 ENCOUNTER — Encounter

## 2023-07-12 ENCOUNTER — Ambulatory Visit: Attending: Internal Medicine

## 2023-07-12 DIAGNOSIS — M6281 Muscle weakness (generalized): Secondary | ICD-10-CM | POA: Diagnosis present

## 2023-07-12 DIAGNOSIS — R2681 Unsteadiness on feet: Secondary | ICD-10-CM | POA: Diagnosis present

## 2023-07-12 NOTE — Therapy (Unsigned)
 OUTPATIENT PHYSICAL THERAPY BALANCE TREATMENT/PROGRESS NOTE  Dates of reporting period  05/30/23   to   07/12/23   Patient Name: Claudis Preston. MRN: [id number] DOB:07/14/1940, 83 y.o., male Today's Date: 07/13/2023  END OF SESSION:  PT End of Session - 07/12/23 1152     Visit Number 10    Number of Visits 25    Date for PT Re-Evaluation 08/22/23    Authorization Type eval: 05/30/23    PT Start Time 1150    PT Stop Time 1235    PT Time Calculation (min) 45 min    Equipment Utilized During Treatment Gait belt    Activity Tolerance Patient tolerated treatment well    Behavior During Therapy WFL for tasks assessed/performed          Past Medical History:  Diagnosis Date   Anxiety    Back pain    BPH (benign prostatic hyperplasia)    Cancer (HCC)    BASAL CELL   Chest pain, non-cardiac    Colon polyps    Complication of anesthesia    anesthesia stays a long time   COPD (chronic obstructive pulmonary disease) (HCC)    Depression    Essential tremor    Essential tremor    Family history of adverse reaction to anesthesia    DAUGHTERS GET NAUSEATED   GERD (gastroesophageal reflux disease)    H/O   Helicobacter pylori (H. pylori) infection    Hyperlipidemia    Neuromuscular disorder (HCC)    TREMORS   Reactive airway disease    Reactive airway disease    Restless leg syndrome    Past Surgical History:  Procedure Laterality Date   COLONOSCOPY  2013   COLONOSCOPY WITH PROPOFOL  N/A 03/21/2016   Procedure: COLONOSCOPY WITH PROPOFOL ;  Surgeon: Lamar ONEIDA Holmes, MD;  Location: [facility] ENDOSCOPY;  Service: Endoscopy;  Laterality: N/A;   COLONOSCOPY WITH PROPOFOL  N/A 03/09/2018   Procedure: COLONOSCOPY WITH PROPOFOL ;  Surgeon: Holmes Lamar ONEIDA, MD;  Location: [facility] ENDOSCOPY;  Service: Endoscopy;  Laterality: N/A;   ESOPHAGOGASTRODUODENOSCOPY     ESOPHAGOGASTRODUODENOSCOPY N/A 03/09/2018   Procedure: ESOPHAGOGASTRODUODENOSCOPY (EGD);  Surgeon: Holmes Lamar ONEIDA, MD;  Location: [facility]  ENDOSCOPY;  Service: Endoscopy;  Laterality: N/A;   HERNIA REPAIR     HOLEP-LASER ENUCLEATION OF THE PROSTATE WITH MORCELLATION N/A 05/10/2019   Procedure: HOLEP-LASER ENUCLEATION OF THE PROSTATE WITH MORCELLATION;  Surgeon: Francisca Redell BROCKS, MD;  Location: [facility] ORS;  Service: Urology;  Laterality: N/A;   INGUINAL HERNIA REPAIR Right 08/01/2014   Procedure: RIGHT INGUINAL HERNIA  REPAIR WITH MESH ;  Surgeon: Reyes LELON Cota, MD;  Location: [facility] ORS;  Service: General;  Laterality: Right; with large Ultra Pro mesh   NASAL SINUS SURGERY     ROTATOR CUFF REPAIR     TRIGGER FINGER RELEASE     Patient Active Problem List   Diagnosis Date Noted   Postprocedural stricture of urethra at fossa navicularis 07/09/2019   BPH with obstruction/lower urinary tract symptoms 08/21/2018   Restless leg syndrome 07/24/2018   Reactive airway disease 07/24/2018   Pure hypercholesterolemia 07/24/2018   Mechanical back pain 07/24/2018   History of Helicobacter pylori infection 07/24/2018   Essential tremor 07/24/2018   Depression 07/24/2018   BPH associated with nocturia 07/24/2018   Benign essential hypertension 07/24/2018   Anxiety 07/24/2018   Trigger finger of left thumb 04/14/2017   Hand pain, left 04/14/2017   Visual disturbance 07/18/2016   Tremor 07/18/2016   Imbalance 07/18/2016  Dizziness 07/18/2016   Right inguinal hernia 07/12/2014    PCP: Auston Reyes BIRCH, MD  REFERRING PROVIDER: Auston Reyes BIRCH, MD   REFERRING DIAG:  R26.89 (ICD-10-CM) - Other abnormalities of gait and mobility  R42 (ICD-10-CM) - Dizziness and giddiness   RATIONALE FOR EVALUATION AND TREATMENT: Rehabilitation  THERAPY DIAG: Unsteadiness on feet  Muscle weakness (generalized)  ONSET DATE: Multiple years  FOLLOW-UP APPT SCHEDULED WITH REFERRING PROVIDER: Yes, neurology;  FROM INITIAL EVALUATION SUBJECTIVE:                                                                                                                                                                                          SUBJECTIVE STATEMENT:  Unsteadiness  PERTINENT HISTORY:  Patient with worsening gait and balance. Feels he is dragging his feet. Infrequently using assistive device. Improved RLS since starting gabapentin. Intermittent episodes of vertigo with position changes.  He goes to [employer] for exercise 1-2x/wk. Ongoing tremors in bilateral hands, worse with activity or stress. He reports 4 falls with the most recent about 1 week ago. Pt reports that he was stepping backwards when he fell. He couldn't get up and had to call extended family to help him from the ground. His wife was not strong enough to assist. He denies any significant injury from the fall. He was referred to PT for sensory ataxia and it was recommended he use a walker as well as perform balance exercise.  08/02/2022 MR BRAIN W WO MR ORBITS W WO IMPRESSION:  1. Atrophic right optic nerve which may reflect sequela of prior  optic neuritis.  2. Otherwise unremarkable appearance of the orbits with no acute  finding.  3. No acute intracranial pathology.  4. Moderate background chronic small-vessel ischemic change.   12/30/2019 MR BRAIN WO IMPRESSION:  No evidence of recent infarction, hemorrhage, or mass. Mild progression of parenchymal volume loss and probable chronic microvascular ischemic changes since 2018. Small chronic left pontine infarcts.   06/10/16 CAROTID ARTERY ULTRASOUND  IMPRESSION: 1. Minimal bilateral carotid bifurcation atherosclerotic vascular plaque. No flow limiting stenosis. Degree of stenosis less than 50% bilaterally.  2. Vertebrals are patent with antegrade flow.  06/02/16  MRI BRAIN WO CONTRAST  IMPRESSION: Atrophy. Small vessel disease. Evidence for chronic brainstem lacunar infarction. No acute intracranial findings. Chronic pansinusitis. Despite prior surgery, significant fluid accumulation and mucosal thickening can  be seen, particularly in the RIGHT greater than LEFT frontoethmoid regions.    Pain: No Numbness/Tingling: No, but reports some feet burning; Focal Weakness: No Recent changes in overall health/medication: Yes, adjustment of meds Prior history of physical therapy for balance:  No  Dominant hand: right Imaging: Yes  Red flags: Positive for basal cell carcinoma, Negative for abdominal pain, chills/fever, night sweats, nausea, vomiting, unrelenting pain  PRECAUTIONS: Fall  WEIGHT BEARING RESTRICTIONS: No  FALLS: Has patient fallen in last 6 months? Yes. Number of falls 1, Directional pattern for falls: No  Living Environment Lives with: lives with their spouse Lives in: House/apartment Stairs: one level home, 2 steps to enter from the garage with L rail during ascend.  Has following equipment at home: grab bars in walk-in shower, no seat, rollator (uses periodically)   Prior level of function: Independent  Occupational demands: Retired Nutritional therapist: hunting (I can't get into the stand anymore), previously like to play golf but he can no longer bend over to put the ball on the tee;  Patient Goals: Pt would like to improve his balance   OBJECTIVE:   Patient Surveys  ABC: To be completed  Cognition Patient is oriented to person, place, and time.  Recent memory is intact.  Remote memory is intact.  Attention span and concentration are intact.  Expressive speech is intact.  Patient's fund of knowledge is within normal limits for educational level.    Gross Musculoskeletal Assessment Tremor: BUE tremors observed, worsened with stress; Bulk: Normal Tone: Normal  Posture: Mild forward head and rounded shoulders  AROM Deferred  LE MMT: MMT (out of 5) Right  Left   Hip flexion 4 4-  Hip extension    Hip abduction (seated) 5 5  Hip adduction (seated) 5 5  Hip internal rotation    Hip external rotation    Knee flexion 5 5  Knee extension 4+ 4+  Ankle  dorsiflexion 4+ 4+  Ankle plantarflexion Active Active  (* = pain; Blank rows = not tested)  Sensation Grossly intact to light touch throughout bilateral LEs as determined by testing dermatomes L2-S2. Proprioception, stereognosis, and hot/cold testing deferred on this date.  Reflexes R/L Knee Jerk (L3/4): 2+/2+  Ankle Jerk (S1/2): 2+/2+   Cranial Nerves Deferred  Coordination/Cerebellar Deferred  Transfers: Assistive device utilized: None  Sit to stand: Complete Independence Stand to sit: Complete Independence Chair to chair: Complete Independence Floor: Not assessed  Stairs: Level of Assistance: Complete Independence Stair Negotiation Technique: Alternating Pattern  with Bilateral Rails Number of Stairs: 4  Height of Stairs: 6  Comments: Decreased speed but good stability noted with single or bilateral UE support on rails  Gait: Gait pattern: decreased step length- Right, decreased step length- Left, poor foot clearance- Right, and poor foot clearance- Left Distance walked: 100' Assistive device utilized: None Level of assistance: Complete Independence Comments: Pt ambulates with decreased self-selected speed. Decreased step length bilaterally  BPPV TESTS:  Symptoms Duration Intensity Nystagmus  L Dix-Hallpike None   None  R Dix-Hallpike None   None  L Head Roll None   None  R Head Roll None   None  L Sidelying Test      R Sidelying Test      (blank = not tested)  mCTSIB: 5.4s  Functional Outcome Measures  05/30/23 06/07/23 Comments  BERG 49/56    DGI  17/24   FGA     TUG 19.7 seconds    5TSTS 13.0 seconds    6 Minute Walk Test     10 Meter Gait Speed Self-selected: 15.2s = 0.66 m/s; Fastest: 11.8s = 0.85 m/s    (Blank rows = not tested)  Coordination/Cerebellar Testing Finger to Nose: Dysmetria BUE but difficult to assess  secondary to tremor; Heel to Shin: WNL Rapid alternating movements: WNL Finger Opposition: WNL Pronator Drift:  Negative   TODAY'S TREATMENT: 07/12/23    SUBJECTIVE: Pt reports that he is doing well today. No changes since the last therapy session. Denies pertinent pain. Patient reports no falls since last session. No specific questions or concerns.    PAIN: Denies   Ther-ex  NuStep L5 x 10 mins (5 mins unbilled) for BLE strengthening and warm-up during interval history;   Ther-Act  Functional Outcome Measures  05/30/23 06/07/23 07/12/23  BERG 49/56  52/56  DGI  17/24   FGA     TUG 19.7 seconds  13.51 seconds  5TSTS 13.0 seconds  11.30 seconds  6 Minute Walk Test     10 Meter Gait Speed Self-selected: 15.2s = 0.66 m/s; Fastest: 11.8s = 0.85 m/s  Self-Selected: 13.46= 0.74 m/s; Fastest: 11.13 = 0.90 m/s  (Blank rows = not tested)   Not Performed this session:  Standing exercises with 5# AW: Hip flexion marches x 20 BLE; Hamstring curls x 20 BLE; Hip abduction x 20 BLE; Hip extension x 20 BLE; Obstacle course in hallway, step ups, balancing, backwards and forwards walking 4 x 50 ft  Backwards walking in hallway with cone pick-ups off floor 4 x 50 ft  Forwards walking with ball toss to SPT outside of BOS 3 x 50 ft  Cross-body marches with 5# ankle weights (AW) x 3 laps in // bars; Seated adductor ball squeezes 2 x 10 x 5 BLE; Airex feet apart eyes open/closed x 30s each; Airex feet apart eyes open with horizontal and vertical head turns x 30s each; Seated clams with manual resistance from therapist x 10 BLE; Seated alternating LAQ with 5# AW x 10 BLE; Airex balance beam side stepping x multiple laps; Airex balance beam tandem stepping x multiple laps; Airex feet together eyes open/closed x 30s each; Airex feet together eyes open with horizontal and vertical head turns x 30s each; All balance exercises performed in parallel bars and without UE support unless otherwise specified: Fishing game reaching outside of BOS with no UE support x1 ea hand;  Nautilus MidRow #30 x 10 x 3 sec scap  squeeze hold; Nautilus Pallof Press #20 x 2 x 10 x 3 sec scap squeeze hold; Golf chipping with ball pick up x 3 bouts x 5 balls; Blazepod balance on AE pad, semicircle reaching outside BOS, 6 pods, random light-up, 1 min, Trial 1: 19 hits, Trial 2: 22 hits, Trial 3: 19 hits; STS with #6 chest press throw to rebounder 2 x 10;    PATIENT EDUCATION:  Education details: Pt educated throughout session about proper posture and technique with exercises. Improved exercise technique, movement at target joints, use of target muscles after min to mod verbal, visual, tactile cues.  Person educated: Patient Education method: Explanation, Demonstration, Verbal cues, and Handouts Education comprehension: verbalized understanding   HOME EXERCISE PROGRAM:  Access Code: [email or web address] URL: [email or web address] Date: 06/07/2023 Prepared by: Selinda Eck  Exercises - Sit to Stand Without Arm Support  - 1 x daily - 7 x weekly - 3 sets - 10 reps - Heel Raises with Counter Support  - 1 x daily - 7 x weekly - 3 sets - 10 reps - Standing Romberg to 1/2 Tandem Stance  - 1 x daily - 7 x weekly - 3 reps - 30s hold  ASSESSMENT:  CLINICAL IMPRESSION: Notable BUE tremor present during today's session. Updated goals and outcome measures  this session with patient. Pt met his goals of improving his BERG and TUG. BERG score improved from 49/56 to 52/56. TUG score improved from 19.7 seconds to 13.51 seconds. Both of these improvements indicate that the pt is at a decreased risk of falls compared to his initial evaluation on 05/30/23. He made good progress towards his goals of improving his ABC score and his self-selected pace. His ABC improved by 8% and his self-selected gait speed improved by 0.08 m/s. Pt's self-selected gait speed is still below 0.8 m/s which is the clinically relevant speed for community ambulation. is deferred to a future session secondary to time constraints. Plan for future  sessions is to perform more ambulation activities that include dynamic or multi-tasking activities. Pt encouraged to follow-up as scheduled. Pt would benefit from continued physical therapy services at this time so he can increase his strength, decrease his ambulation falls risk, and improve his balance so he can participate safely in his environment.   OBJECTIVE IMPAIRMENTS: Abnormal gait, decreased balance, difficulty walking, decreased strength, and dizziness.   ACTIVITY LIMITATIONS: standing, stairs, transfers, and caring for others  PARTICIPATION LIMITATIONS: meal prep, cleaning, laundry, shopping, and community activity  PERSONAL FACTORS: Age, Past/current experiences, Time since onset of injury/illness/exacerbation, and 3+ comorbidities: back pain, tremors, anxiety, depression are also affecting patient's functional outcome.   REHAB POTENTIAL: Good  CLINICAL DECISION MAKING: Unstable/unpredictable  EVALUATION COMPLEXITY: High   GOALS: Goals reviewed with patient? No  SHORT TERM GOALS: Target date: 07/11/2023  Pt will be independent with HEP in order to improve strength and balance in order to decrease fall risk and improve function at home. Baseline:  Goal status: INITIAL   LONG TERM GOALS: Target date: 08/22/2023   Pt will improve ABC by at least 13% in order to demonstrate clinically significant improvement in balance confidence.  Baseline: 62.2%, 07/12/23: 70% Goal status: IN PROGRESS  2.  Pt will improve BERG by at least 3 points in order to demonstrate clinically significant improvement in balance.   Baseline: 49/56; 07/12/23: 52/56 Goal status: MET  3.  Pt will decrease TUG to below 14 seconds/decrease in order to demonstrate decreased fall risk.       Baseline: 19.7s, 07/12/23: 13.51 sec Goal status: MET  4. Pt will increase self-selected 35m gait speed to at least 0.80 m/s in order to demonstrate clinically significant improvement in community ambulation.     Baseline:  0.66 m/s, 07/12/23: 0.74 m/s Goal status: IN PROGRESS  5. Pt will increase by at least 41m (130ft) in order to demonstrate clinically significant improvement in cardiopulmonary endurance and community ambulation      Baseline: 06/09/23: 916' Goal status: INITIAL  PLAN: PT FREQUENCY: 2x/week  PT DURATION: 8 weeks  PLANNED INTERVENTIONS: Therapeutic exercises, Therapeutic activity, Neuromuscular re-education, Balance training, Gait training, Patient/Family education, Self Care, Joint mobilization, Joint manipulation, Vestibular training, Canalith repositioning, Orthotic/Fit training, DME instructions, Dry Needling, Electrical stimulation, Spinal manipulation, Spinal mobilization, Cryotherapy, Moist heat, Taping, Traction, Ultrasound, Ionotophoresis 4mg /ml Dexamethasone , Manual therapy, and Re-evaluation.  PLAN FOR NEXT SESSION:  progress strength/balance exercises, review/modify HEP as necessary; blaze pod reaching, cone taps, physioball rows, forward and lateral step-ups, rebounder balance perturbations  Selinda JONETTA Eck PT, DPT, GCS  Vernell Moats, SPT  07/13/2023, 8:38 AM

## 2023-07-14 ENCOUNTER — Ambulatory Visit

## 2023-07-14 DIAGNOSIS — R2681 Unsteadiness on feet: Secondary | ICD-10-CM

## 2023-07-14 DIAGNOSIS — M6281 Muscle weakness (generalized): Secondary | ICD-10-CM

## 2023-07-14 NOTE — Therapy (Unsigned)
 OUTPATIENT PHYSICAL THERAPY BALANCE TREATMENT  Patient Name: Wesley Preston. MRN: [id number] DOB:1940/02/13, 83 y.o., male Today's Date: 07/14/2023  END OF SESSION:  PT End of Session - 07/14/23 1015     Visit Number 11    Number of Visits 25    Date for PT Re-Evaluation 08/22/23    Authorization Type eval: 05/30/23    PT Start Time 1015    PT Stop Time 1102    PT Time Calculation (min) 47 min    Equipment Utilized During Treatment Gait belt    Activity Tolerance Patient tolerated treatment well    Behavior During Therapy WFL for tasks assessed/performed          Past Medical History:  Diagnosis Date   Anxiety    Back pain    BPH (benign prostatic hyperplasia)    Cancer (HCC)    BASAL CELL   Chest pain, non-cardiac    Colon polyps    Complication of anesthesia    anesthesia stays a long time   COPD (chronic obstructive pulmonary disease) (HCC)    Depression    Essential tremor    Essential tremor    Family history of adverse reaction to anesthesia    DAUGHTERS GET NAUSEATED   GERD (gastroesophageal reflux disease)    H/O   Helicobacter pylori (H. pylori) infection    Hyperlipidemia    Neuromuscular disorder (HCC)    TREMORS   Reactive airway disease    Reactive airway disease    Restless leg syndrome    Past Surgical History:  Procedure Laterality Date   COLONOSCOPY  2013   COLONOSCOPY WITH PROPOFOL  N/A 03/21/2016   Procedure: COLONOSCOPY WITH PROPOFOL ;  Surgeon: Lamar ONEIDA Holmes, MD;  Location: [facility] ENDOSCOPY;  Service: Endoscopy;  Laterality: N/A;   COLONOSCOPY WITH PROPOFOL  N/A 03/09/2018   Procedure: COLONOSCOPY WITH PROPOFOL ;  Surgeon: Holmes Lamar ONEIDA, MD;  Location[facility] ENDOSCOPY;  Service: Endoscopy;  Laterality: N/A;   ESOPHAGOGASTRODUODENOSCOPY     ESOPHAGOGASTRODUODENOSCOPY N/A 03/09/2018   Procedure: ESOPHAGOGASTRODUODENOSCOPY (EGD);  Surgeon: Holmes Lamar ONEIDA, MD;  Location: [facility] ENDOSCOPY;  Service: Endoscopy;  Laterality: N/A;   HERNIA REPAIR      HOLEP-LASER ENUCLEATION OF THE PROSTATE WITH MORCELLATION N/A 05/10/2019   Procedure: HOLEP-LASER ENUCLEATION OF THE PROSTATE WITH MORCELLATION;  Surgeon: Francisca Redell BROCKS, MD;  Location: [facility] ORS;  Service: Urology;  Laterality: N/A;   INGUINAL HERNIA REPAIR Right 08/01/2014   Procedure: RIGHT INGUINAL HERNIA  REPAIR WITH MESH ;  Surgeon: Reyes LELON Cota, MD;  Location: [facility] ORS;  Service: General;  Laterality: Right; with large Ultra Pro mesh   NASAL SINUS SURGERY     ROTATOR CUFF REPAIR     TRIGGER FINGER RELEASE     Patient Active Problem List   Diagnosis Date Noted   Postprocedural stricture of urethra at fossa navicularis 07/09/2019   BPH with obstruction/lower urinary tract symptoms 08/21/2018   Restless leg syndrome 07/24/2018   Reactive airway disease 07/24/2018   Pure hypercholesterolemia 07/24/2018   Mechanical back pain 07/24/2018   History of Helicobacter pylori infection 07/24/2018   Essential tremor 07/24/2018   Depression 07/24/2018   BPH associated with nocturia 07/24/2018   Benign essential hypertension 07/24/2018   Anxiety 07/24/2018   Trigger finger of left thumb 04/14/2017   Hand pain, left 04/14/2017   Visual disturbance 07/18/2016   Tremor 07/18/2016   Imbalance 07/18/2016   Dizziness 07/18/2016   Right inguinal hernia 07/12/2014    PCP: Auston,  Reyes BIRCH, MD  REFERRING PROVIDER: Auston Reyes BIRCH, MD   REFERRING DIAG:  R26.89 (ICD-10-CM) - Other abnormalities of gait and mobility  R42 (ICD-10-CM) - Dizziness and giddiness   RATIONALE FOR EVALUATION AND TREATMENT: Rehabilitation  THERAPY DIAG: Unsteadiness on feet  Muscle weakness (generalized)  ONSET DATE: Multiple years  FOLLOW-UP APPT SCHEDULED WITH REFERRING PROVIDER: Yes, neurology;  FROM INITIAL EVALUATION SUBJECTIVE:                                                                                                                                                                                          SUBJECTIVE STATEMENT:  Unsteadiness  PERTINENT HISTORY:  Patient with worsening gait and balance. Feels he is dragging his feet. Infrequently using assistive device. Improved RLS since starting gabapentin. Intermittent episodes of vertigo with position changes.  He goes to [employer] for exercise 1-2x/wk. Ongoing tremors in bilateral hands, worse with activity or stress. He reports 4 falls with the most recent about 1 week ago. Pt reports that he was stepping backwards when he fell. He couldn't get up and had to call extended family to help him from the ground. His wife was not strong enough to assist. He denies any significant injury from the fall. He was referred to PT for sensory ataxia and it was recommended he use a walker as well as perform balance exercise.  08/02/2022 MR BRAIN W WO MR ORBITS W WO IMPRESSION:  1. Atrophic right optic nerve which may reflect sequela of prior  optic neuritis.  2. Otherwise unremarkable appearance of the orbits with no acute  finding.  3. No acute intracranial pathology.  4. Moderate background chronic small-vessel ischemic change.   12/30/2019 MR BRAIN WO IMPRESSION:  No evidence of recent infarction, hemorrhage, or mass. Mild progression of parenchymal volume loss and probable chronic microvascular ischemic changes since 2018. Small chronic left pontine infarcts.   06/10/16 CAROTID ARTERY ULTRASOUND  IMPRESSION: 1. Minimal bilateral carotid bifurcation atherosclerotic vascular plaque. No flow limiting stenosis. Degree of stenosis less than 50% bilaterally.  2. Vertebrals are patent with antegrade flow.  06/02/16  MRI BRAIN WO CONTRAST  IMPRESSION: Atrophy. Small vessel disease. Evidence for chronic brainstem lacunar infarction. No acute intracranial findings. Chronic pansinusitis. Despite prior surgery, significant fluid accumulation and mucosal thickening can be seen, particularly in the RIGHT greater than LEFT frontoethmoid  regions.    Pain: No Numbness/Tingling: No, but reports some feet burning; Focal Weakness: No Recent changes in overall health/medication: Yes, adjustment of meds Prior history of physical therapy for balance:  No Dominant hand: right Imaging: Yes  Red flags: Positive for basal cell carcinoma,  Negative for abdominal pain, chills/fever, night sweats, nausea, vomiting, unrelenting pain  PRECAUTIONS: Fall  WEIGHT BEARING RESTRICTIONS: No  FALLS: Has patient fallen in last 6 months? Yes. Number of falls 1, Directional pattern for falls: No  Living Environment Lives with: lives with their spouse Lives in: House/apartment Stairs: one level home, 2 steps to enter from the garage with L rail during ascend.  Has following equipment at home: grab bars in walk-in shower, no seat, rollator (uses periodically)   Prior level of function: Independent  Occupational demands: Retired Nutritional therapist: hunting (I can't get into the stand anymore), previously like to play golf but he can no longer bend over to put the ball on the tee;  Patient Goals: Pt would like to improve his balance   OBJECTIVE:   Patient Surveys  ABC: To be completed  Cognition Patient is oriented to person, place, and time.  Recent memory is intact.  Remote memory is intact.  Attention span and concentration are intact.  Expressive speech is intact.  Patient's fund of knowledge is within normal limits for educational level.    Gross Musculoskeletal Assessment Tremor: BUE tremors observed, worsened with stress; Bulk: Normal Tone: Normal  Posture: Mild forward head and rounded shoulders  AROM Deferred  LE MMT: MMT (out of 5) Right  Left   Hip flexion 4 4-  Hip extension    Hip abduction (seated) 5 5  Hip adduction (seated) 5 5  Hip internal rotation    Hip external rotation    Knee flexion 5 5  Knee extension 4+ 4+  Ankle dorsiflexion 4+ 4+  Ankle plantarflexion Active Active  (* = pain;  Blank rows = not tested)  Sensation Grossly intact to light touch throughout bilateral LEs as determined by testing dermatomes L2-S2. Proprioception, stereognosis, and hot/cold testing deferred on this date.  Reflexes R/L Knee Jerk (L3/4): 2+/2+  Ankle Jerk (S1/2): 2+/2+   Cranial Nerves Deferred  Coordination/Cerebellar Deferred  Transfers: Assistive device utilized: None  Sit to stand: Complete Independence Stand to sit: Complete Independence Chair to chair: Complete Independence Floor: Not assessed  Stairs: Level of Assistance: Complete Independence Stair Negotiation Technique: Alternating Pattern  with Bilateral Rails Number of Stairs: 4  Height of Stairs: 6  Comments: Decreased speed but good stability noted with single or bilateral UE support on rails  Gait: Gait pattern: decreased step length- Right, decreased step length- Left, poor foot clearance- Right, and poor foot clearance- Left Distance walked: 100' Assistive device utilized: None Level of assistance: Complete Independence Comments: Pt ambulates with decreased self-selected speed. Decreased step length bilaterally  BPPV TESTS:  Symptoms Duration Intensity Nystagmus  L Dix-Hallpike None   None  R Dix-Hallpike None   None  L Head Roll None   None  R Head Roll None   None  L Sidelying Test      R Sidelying Test      (blank = not tested)  mCTSIB: 5.4s  Functional Outcome Measures  05/30/23 06/07/23 Comments  BERG 49/56    DGI  17/24   FGA     TUG 19.7 seconds    5TSTS 13.0 seconds    6 Minute Walk Test     10 Meter Gait Speed Self-selected: 15.2s = 0.66 m/s; Fastest: 11.8s = 0.85 m/s    (Blank rows = not tested)  Coordination/Cerebellar Testing Finger to Nose: Dysmetria BUE but difficult to assess secondary to tremor; Heel to Shin: WNL Rapid alternating movements: WNL Finger Opposition:  WNL Pronator Drift: Negative  Functional Outcome Measures 07/12/23  05/30/23 06/07/23 07/12/23  BERG 49/56   52/56  DGI  17/24   FGA     TUG 19.7 seconds  13.51 seconds  5TSTS 13.0 seconds  11.30 seconds  6 Minute Walk Test     10 Meter Gait Speed Self-selected: 15.2s = 0.66 m/s; Fastest: 11.8s = 0.85 m/s  Self-Selected: 13.46= 0.74 m/s; Fastest: 11.13 = 0.90 m/s  (Blank rows = not tested)   TODAY'S TREATMENT: 07/14/23    SUBJECTIVE: Pt reports that he is doing well today. No changes since the last therapy session. Denies pertinent pain. Patient reports no falls since last session. No specific questions or concerns.    PAIN: Denies   Ther-Act  NuStep L5 x 10 mins (5 mins unbilled) for BLE strengthening and warm-up during interval history; Backwards walking outside with cone pick-ups off floor 4 x 50 ft;  Forwards walking with ball toss to SPT outside of BOS 4 x 50 ft;  MidRow Black TB 2 x 10 x 3 sec scap squeeze; Pallof Press Black TB  2 x 10 x 3 sec scap squeeze hold;  Standing exercises with 5# AW: Hip flexion marches x 20 BLE; Hamstring curls x 20 BLE; Hip abduction x 20 BLE; Hip extension x 20 BLE;   Not Performed this session:  Obstacle course in hallway, step ups, balancing, backwards and forwards walking 4 x 50 ft  Cross-body marches with 5# ankle weights (AW) x 3 laps in // bars; Seated adductor ball squeezes 2 x 10 x 5 BLE; Airex feet apart eyes open/closed x 30s each; Airex feet apart eyes open with horizontal and vertical head turns x 30s each; Seated clams with manual resistance from therapist x 10 BLE; Seated alternating LAQ with 5# AW x 10 BLE; Airex balance beam side stepping x multiple laps; Airex balance beam tandem stepping x multiple laps; Airex feet together eyes open/closed x 30s each; Airex feet together eyes open with horizontal and vertical head turns x 30s each; All balance exercises performed in parallel bars and without UE support unless otherwise specified: Fishing game reaching outside of BOS with no UE support x1 ea hand;  Golf chipping with  ball pick up x 3 bouts x 5 balls; Blazepod balance on AE pad, semicircle reaching outside BOS, 6 pods, random light-up, 1 min, Trial 1: 19 hits, Trial 2: 22 hits, Trial 3: 19 hits; STS with #6 chest press throw to rebounder 2 x 10;    PATIENT EDUCATION:  Education details: Pt educated throughout session about proper posture and technique with exercises. Improved exercise technique, movement at target joints, use of target muscles after min to mod verbal, visual, tactile cues.  Person educated: Patient Education method: Explanation, Demonstration, Verbal cues, and Handouts Education comprehension: verbalized understanding   HOME EXERCISE PROGRAM:  Access Code: [email or web address] URL: [email or web address] Date: 06/07/2023 Prepared by: Selinda Eck  Exercises - Sit to Stand Without Arm Support  - 1 x daily - 7 x weekly - 3 sets - 10 reps - Heel Raises with Counter Support  - 1 x daily - 7 x weekly - 3 sets - 10 reps - Standing Romberg to 1/2 Tandem Stance  - 1 x daily - 7 x weekly - 3 reps - 30s hold  ASSESSMENT:  CLINICAL IMPRESSION: Notable BUE tremor present during today's session. Pt was able to continue using 5 # AW this session to progress strengthening exercises. Walking with ball toss was  continued to promote visual tracking and multitasking during ambulation. Backwards walking was done to increase his confidence and spatial awareness because that was the mechanism of his last fall. No HEP modifications at this time. Pt encouraged to follow-up as scheduled. Pt would benefit from continued physical therapy services at this time so he can increase his strength, decrease his ambulation falls risk, and improve his balance so he can participate safely in his environment.    OBJECTIVE IMPAIRMENTS: Abnormal gait, decreased balance, difficulty walking, decreased strength, and dizziness.   ACTIVITY LIMITATIONS: standing, stairs, transfers, and caring for others  PARTICIPATION  LIMITATIONS: meal prep, cleaning, laundry, shopping, and community activity  PERSONAL FACTORS: Age, Past/current experiences, Time since onset of injury/illness/exacerbation, and 3+ comorbidities: back pain, tremors, anxiety, depression are also affecting patient's functional outcome.   REHAB POTENTIAL: Good  CLINICAL DECISION MAKING: Unstable/unpredictable  EVALUATION COMPLEXITY: High   GOALS: Goals reviewed with patient? No  SHORT TERM GOALS: Target date: 07/11/2023  Pt will be independent with HEP in order to improve strength and balance in order to decrease fall risk and improve function at home. Baseline:  Goal status: INITIAL   LONG TERM GOALS: Target date: 08/22/2023   Pt will improve ABC by at least 13% in order to demonstrate clinically significant improvement in balance confidence.  Baseline: 62.2%, 07/12/23: 70% Goal status: IN PROGRESS  2.  Pt will improve BERG by at least 3 points in order to demonstrate clinically significant improvement in balance.   Baseline: 49/56; 07/12/23: 52/56 Goal status: MET  3.  Pt will decrease TUG to below 14 seconds/decrease in order to demonstrate decreased fall risk.       Baseline: 19.7s, 07/12/23: 13.51 sec Goal status: MET  4. Pt will increase self-selected 7m gait speed to at least 0.80 m/s in order to demonstrate clinically significant improvement in community ambulation.     Baseline: 0.66 m/s, 07/12/23: 0.74 m/s Goal status: IN PROGRESS  5. Pt will increase by at least 71m (165ft) in order to demonstrate clinically significant improvement in cardiopulmonary endurance and community ambulation      Baseline: 06/09/23: 916' Goal status: INITIAL  PLAN: PT FREQUENCY: 2x/week  PT DURATION: 8 weeks  PLANNED INTERVENTIONS: Therapeutic exercises, Therapeutic activity, Neuromuscular re-education, Balance training, Gait training, Patient/Family education, Self Care, Joint mobilization, Joint manipulation, Vestibular training, Canalith  repositioning, Orthotic/Fit training, DME instructions, Dry Needling, Electrical stimulation, Spinal manipulation, Spinal mobilization, Cryotherapy, Moist heat, Taping, Traction, Ultrasound, Ionotophoresis 4mg /ml Dexamethasone , Manual therapy, and Re-evaluation.  PLAN FOR NEXT SESSION:  progress strength/balance exercises, review/modify HEP as necessary; blaze pod reaching, cone taps, physioball rows, forward and lateral step-ups, rebounder balance perturbations  Selinda JONETTA Eck PT, DPT, GCS  Vernell Moats, SPT  07/14/2023, 11:07 AM

## 2023-07-19 ENCOUNTER — Ambulatory Visit

## 2023-07-19 DIAGNOSIS — R2681 Unsteadiness on feet: Secondary | ICD-10-CM

## 2023-07-19 DIAGNOSIS — M6281 Muscle weakness (generalized): Secondary | ICD-10-CM

## 2023-07-19 NOTE — Therapy (Unsigned)
 OUTPATIENT PHYSICAL THERAPY BALANCE TREATMENT  Patient Name: Wesley Preston. MRN: [id number] DOB:1940-12-21, 83 y.o., male Today's Date: 07/20/2023  END OF SESSION:  PT End of Session - 07/19/23 1150     Visit Number 12    Number of Visits 25    Date for PT Re-Evaluation 08/22/23    Authorization Type eval: 05/30/23    PT Start Time 1150    PT Stop Time 1230    PT Time Calculation (min) 40 min    Equipment Utilized During Treatment Gait belt    Activity Tolerance Patient tolerated treatment well    Behavior During Therapy WFL for tasks assessed/performed          Past Medical History:  Diagnosis Date   Anxiety    Back pain    BPH (benign prostatic hyperplasia)    Cancer (HCC)    BASAL CELL   Chest pain, non-cardiac    Colon polyps    Complication of anesthesia    anesthesia stays a long time   COPD (chronic obstructive pulmonary disease) (HCC)    Depression    Essential tremor    Essential tremor    Family history of adverse reaction to anesthesia    DAUGHTERS GET NAUSEATED   GERD (gastroesophageal reflux disease)    H/O   Helicobacter pylori (H. pylori) infection    Hyperlipidemia    Neuromuscular disorder (HCC)    TREMORS   Reactive airway disease    Reactive airway disease    Restless leg syndrome    Past Surgical History:  Procedure Laterality Date   COLONOSCOPY  2013   COLONOSCOPY WITH PROPOFOL  N/A 03/21/2016   Procedure: COLONOSCOPY WITH PROPOFOL ;  Surgeon: Lamar ONEIDA Holmes, MD;  Location: [facility] ENDOSCOPY;  Service: Endoscopy;  Laterality: N/A;   COLONOSCOPY WITH PROPOFOL  N/A 03/09/2018   Procedure: COLONOSCOPY WITH PROPOFOL ;  Surgeon: Holmes Lamar ONEIDA, MD;  Location: [facility] ENDOSCOPY;  Service: Endoscopy;  Laterality: N/A;   ESOPHAGOGASTRODUODENOSCOPY     ESOPHAGOGASTRODUODENOSCOPY N/A 03/09/2018   Procedure: ESOPHAGOGASTRODUODENOSCOPY (EGD);  Surgeon: Holmes Lamar ONEIDA, MD;  Location: [facility] ENDOSCOPY;  Service: Endoscopy;  Laterality: N/A;   HERNIA REPAIR      HOLEP-LASER ENUCLEATION OF THE PROSTATE WITH MORCELLATION N/A 05/10/2019   Procedure: HOLEP-LASER ENUCLEATION OF THE PROSTATE WITH MORCELLATION;  Surgeon: Francisca Redell BROCKS, MD;  Location: [facility] ORS;  Service: Urology;  Laterality: N/A;   INGUINAL HERNIA REPAIR Right 08/01/2014   Procedure: RIGHT INGUINAL HERNIA  REPAIR WITH MESH ;  Surgeon: Reyes LELON Cota, MD;  Location: [facility] ORS;  Service: General;  Laterality: Right; with large Ultra Pro mesh   NASAL SINUS SURGERY     ROTATOR CUFF REPAIR     TRIGGER FINGER RELEASE     Patient Active Problem List   Diagnosis Date Noted   Postprocedural stricture of urethra at fossa navicularis 07/09/2019   BPH with obstruction/lower urinary tract symptoms 08/21/2018   Restless leg syndrome 07/24/2018   Reactive airway disease 07/24/2018   Pure hypercholesterolemia 07/24/2018   Mechanical back pain 07/24/2018   History of Helicobacter pylori infection 07/24/2018   Essential tremor 07/24/2018   Depression 07/24/2018   BPH associated with nocturia 07/24/2018   Benign essential hypertension 07/24/2018   Anxiety 07/24/2018   Trigger finger of left thumb 04/14/2017   Hand pain, left 04/14/2017   Visual disturbance 07/18/2016   Tremor 07/18/2016   Imbalance 07/18/2016   Dizziness 07/18/2016   Right inguinal hernia 07/12/2014    PCP: Auston,  Reyes BIRCH, MD  REFERRING PROVIDER: Auston Reyes BIRCH, MD   REFERRING DIAG:  R26.89 (ICD-10-CM) - Other abnormalities of gait and mobility  R42 (ICD-10-CM) - Dizziness and giddiness   RATIONALE FOR EVALUATION AND TREATMENT: Rehabilitation  THERAPY DIAG: Unsteadiness on feet  Muscle weakness (generalized)  ONSET DATE: Multiple years  FOLLOW-UP APPT SCHEDULED WITH REFERRING PROVIDER: Yes, neurology;  FROM INITIAL EVALUATION SUBJECTIVE:                                                                                                                                                                                          SUBJECTIVE STATEMENT:  Unsteadiness  PERTINENT HISTORY:  Patient with worsening gait and balance. Feels he is dragging his feet. Infrequently using assistive device. Improved RLS since starting gabapentin. Intermittent episodes of vertigo with position changes.  He goes to [employer] for exercise 1-2x/wk. Ongoing tremors in bilateral hands, worse with activity or stress. He reports 4 falls with the most recent about 1 week ago. Pt reports that he was stepping backwards when he fell. He couldn't get up and had to call extended family to help him from the ground. His wife was not strong enough to assist. He denies any significant injury from the fall. He was referred to PT for sensory ataxia and it was recommended he use a walker as well as perform balance exercise.  08/02/2022 MR BRAIN W WO MR ORBITS W WO IMPRESSION:  1. Atrophic right optic nerve which may reflect sequela of prior  optic neuritis.  2. Otherwise unremarkable appearance of the orbits with no acute  finding.  3. No acute intracranial pathology.  4. Moderate background chronic small-vessel ischemic change.   12/30/2019 MR BRAIN WO IMPRESSION:  No evidence of recent infarction, hemorrhage, or mass. Mild progression of parenchymal volume loss and probable chronic microvascular ischemic changes since 2018. Small chronic left pontine infarcts.   06/10/16 CAROTID ARTERY ULTRASOUND  IMPRESSION: 1. Minimal bilateral carotid bifurcation atherosclerotic vascular plaque. No flow limiting stenosis. Degree of stenosis less than 50% bilaterally.  2. Vertebrals are patent with antegrade flow.  06/02/16  MRI BRAIN WO CONTRAST  IMPRESSION: Atrophy. Small vessel disease. Evidence for chronic brainstem lacunar infarction. No acute intracranial findings. Chronic pansinusitis. Despite prior surgery, significant fluid accumulation and mucosal thickening can be seen, particularly in the RIGHT greater than LEFT frontoethmoid  regions.    Pain: No Numbness/Tingling: No, but reports some feet burning; Focal Weakness: No Recent changes in overall health/medication: Yes, adjustment of meds Prior history of physical therapy for balance:  No Dominant hand: right Imaging: Yes  Red flags: Positive for basal cell carcinoma,  Negative for abdominal pain, chills/fever, night sweats, nausea, vomiting, unrelenting pain  PRECAUTIONS: Fall  WEIGHT BEARING RESTRICTIONS: No  FALLS: Has patient fallen in last 6 months? Yes. Number of falls 1, Directional pattern for falls: No  Living Environment Lives with: lives with their spouse Lives in: House/apartment Stairs: one level home, 2 steps to enter from the garage with L rail during ascend.  Has following equipment at home: grab bars in walk-in shower, no seat, rollator (uses periodically)   Prior level of function: Independent  Occupational demands: Retired Nutritional therapist: hunting (I can't get into the stand anymore), previously like to play golf but he can no longer bend over to put the ball on the tee;  Patient Goals: Pt would like to improve his balance   OBJECTIVE:   Patient Surveys  ABC: To be completed  Cognition Patient is oriented to person, place, and time.  Recent memory is intact.  Remote memory is intact.  Attention span and concentration are intact.  Expressive speech is intact.  Patient's fund of knowledge is within normal limits for educational level.    Gross Musculoskeletal Assessment Tremor: BUE tremors observed, worsened with stress; Bulk: Normal Tone: Normal  Posture: Mild forward head and rounded shoulders  AROM Deferred  LE MMT: MMT (out of 5) Right  Left   Hip flexion 4 4-  Hip extension    Hip abduction (seated) 5 5  Hip adduction (seated) 5 5  Hip internal rotation    Hip external rotation    Knee flexion 5 5  Knee extension 4+ 4+  Ankle dorsiflexion 4+ 4+  Ankle plantarflexion Active Active  (* = pain;  Blank rows = not tested)  Sensation Grossly intact to light touch throughout bilateral LEs as determined by testing dermatomes L2-S2. Proprioception, stereognosis, and hot/cold testing deferred on this date.  Reflexes R/L Knee Jerk (L3/4): 2+/2+  Ankle Jerk (S1/2): 2+/2+   Cranial Nerves Deferred  Coordination/Cerebellar Deferred  Transfers: Assistive device utilized: None  Sit to stand: Complete Independence Stand to sit: Complete Independence Chair to chair: Complete Independence Floor: Not assessed  Stairs: Level of Assistance: Complete Independence Stair Negotiation Technique: Alternating Pattern  with Bilateral Rails Number of Stairs: 4  Height of Stairs: 6  Comments: Decreased speed but good stability noted with single or bilateral UE support on rails  Gait: Gait pattern: decreased step length- Right, decreased step length- Left, poor foot clearance- Right, and poor foot clearance- Left Distance walked: 100' Assistive device utilized: None Level of assistance: Complete Independence Comments: Pt ambulates with decreased self-selected speed. Decreased step length bilaterally  BPPV TESTS:  Symptoms Duration Intensity Nystagmus  L Dix-Hallpike None   None  R Dix-Hallpike None   None  L Head Roll None   None  R Head Roll None   None  L Sidelying Test      R Sidelying Test      (blank = not tested)  mCTSIB: 5.4s  Functional Outcome Measures  05/30/23 06/07/23 Comments  BERG 49/56    DGI  17/24   FGA     TUG 19.7 seconds    5TSTS 13.0 seconds    6 Minute Walk Test     10 Meter Gait Speed Self-selected: 15.2s = 0.66 m/s; Fastest: 11.8s = 0.85 m/s    (Blank rows = not tested)  Coordination/Cerebellar Testing Finger to Nose: Dysmetria BUE but difficult to assess secondary to tremor; Heel to Shin: WNL Rapid alternating movements: WNL Finger Opposition:  WNL Pronator Drift: Negative  Functional Outcome Measures 07/12/23  05/30/23 06/07/23 07/12/23  BERG 49/56   52/56  DGI  17/24   FGA     TUG 19.7 seconds  13.51 seconds  5TSTS 13.0 seconds  11.30 seconds  6 Minute Walk Test     10 Meter Gait Speed Self-selected: 15.2s = 0.66 m/s; Fastest: 11.8s = 0.85 m/s  Self-Selected: 13.46= 0.74 m/s; Fastest: 11.13 = 0.90 m/s  (Blank rows = not tested)   TODAY'S TREATMENT: 07/19/23    SUBJECTIVE: Pt reports that he is doing well today. No changes since the last therapy session. Denies pertinent pain. Patient reports no falls since last session. No specific questions or concerns.    PAIN: Denies   Ther-Act  NuStep L5 x 10 mins for BLE strengthening and warm-up during interval history; MidRow Black TB 2 x 10 x 3 sec scap squeeze; Pallof Press Black TB  2 x 10 x 3 sec scap squeeze hold; Tandem stance with no UE support, 2 x 30 ea foot forward;   Standing exercises with 5# AW: Hip flexion marches x 20 BLE; Hamstring curls x 20 BLE; Hip abduction x 20 BLE; Hip extension x 20 BLE;  STS with #6 chest press 2 x 20;   Not Performed this session:  Obstacle course in hallway, step ups, balancing, backwards and forwards walking 4 x 50 ft  Cross-body marches with 5# ankle weights (AW) x 3 laps in // bars; Seated adductor ball squeezes 2 x 10 x 5 BLE; Airex feet apart eyes open/closed x 30s each; Airex feet apart eyes open with horizontal and vertical head turns x 30s each; Seated clams with manual resistance from therapist x 10 BLE; Seated alternating LAQ with 5# AW x 10 BLE; Airex balance beam side stepping x multiple laps; Airex balance beam tandem stepping x multiple laps; Airex feet together eyes open/closed x 30s each; Airex feet together eyes open with horizontal and vertical head turns x 30s each; All balance exercises performed in parallel bars and without UE support unless otherwise specified: Fishing game reaching outside of BOS with no UE support x1 ea hand;  Golf chipping with ball pick up x 3 bouts x 5 balls; Blazepod balance on AE  pad, semicircle reaching outside BOS, 6 pods, random light-up, 1 min, Trial 1: 19 hits, Trial 2: 22 hits, Trial 3: 19 hits; STS with #6 chest press throw to rebounder 2 x 10;  Backwards walking outside with cone pick-ups off floor 4 x 50 ft;  Forwards walking with ball toss to SPT outside of BOS 4 x 50 ft;    PATIENT EDUCATION:  Education details: Pt educated throughout session about proper posture and technique with exercises. Improved exercise technique, movement at target joints, use of target muscles after min to mod verbal, visual, tactile cues.  Person educated: Patient Education method: Explanation, Demonstration, Verbal cues, and Handouts Education comprehension: verbalized understanding   HOME EXERCISE PROGRAM:  Access Code: [email or web address] URL: [email or web address] Date: 06/07/2023 Prepared by: Selinda Eck  Exercises - Sit to Stand Without Arm Support  - 1 x daily - 7 x weekly - 3 sets - 10 reps - Heel Raises with Counter Support  - 1 x daily - 7 x weekly - 3 sets - 10 reps - Standing Romberg to 1/2 Tandem Stance  - 1 x daily - 7 x weekly - 3 reps - 30s hold  ASSESSMENT:  CLINICAL IMPRESSION: Notable BUE tremor present during today's session. Pt  was able to continue using 5# AW this session to progress strengthening exercises. Tandem stance was done to encourage narrow BOS and to challenge the pt's balance. No HEP modifications at this time. Pt encouraged to follow-up as scheduled. Pt would benefit from continued physical therapy services at this time so he can increase his strength, decrease his ambulation falls risk, and improve his balance so he can participate safely in his environment.    OBJECTIVE IMPAIRMENTS: Abnormal gait, decreased balance, difficulty walking, decreased strength, and dizziness.   ACTIVITY LIMITATIONS: standing, stairs, transfers, and caring for others  PARTICIPATION LIMITATIONS: meal prep, cleaning, laundry, shopping, and community  activity  PERSONAL FACTORS: Age, Past/current experiences, Time since onset of injury/illness/exacerbation, and 3+ comorbidities: back pain, tremors, anxiety, depression are also affecting patient's functional outcome.   REHAB POTENTIAL: Good  CLINICAL DECISION MAKING: Unstable/unpredictable  EVALUATION COMPLEXITY: High   GOALS: Goals reviewed with patient? No  SHORT TERM GOALS: Target date: 07/11/2023  Pt will be independent with HEP in order to improve strength and balance in order to decrease fall risk and improve function at home. Baseline:  Goal status: INITIAL   LONG TERM GOALS: Target date: 08/22/2023   Pt will improve ABC by at least 13% in order to demonstrate clinically significant improvement in balance confidence.  Baseline: 62.2%, 07/12/23: 70% Goal status: IN PROGRESS  2.  Pt will improve BERG by at least 3 points in order to demonstrate clinically significant improvement in balance.   Baseline: 49/56; 07/12/23: 52/56 Goal status: MET  3.  Pt will decrease TUG to below 14 seconds/decrease in order to demonstrate decreased fall risk.       Baseline: 19.7s, 07/12/23: 13.51 sec Goal status: MET  4. Pt will increase self-selected 54m gait speed to at least 0.80 m/s in order to demonstrate clinically significant improvement in community ambulation.     Baseline: 0.66 m/s, 07/12/23: 0.74 m/s Goal status: IN PROGRESS  5. Pt will increase by at least 38m (176ft) in order to demonstrate clinically significant improvement in cardiopulmonary endurance and community ambulation      Baseline: 06/09/23: 916' Goal status: INITIAL  PLAN: PT FREQUENCY: 2x/week  PT DURATION: 8 weeks  PLANNED INTERVENTIONS: Therapeutic exercises, Therapeutic activity, Neuromuscular re-education, Balance training, Gait training, Patient/Family education, Self Care, Joint mobilization, Joint manipulation, Vestibular training, Canalith repositioning, Orthotic/Fit training, DME instructions, Dry  Needling, Electrical stimulation, Spinal manipulation, Spinal mobilization, Cryotherapy, Moist heat, Taping, Traction, Ultrasound, Ionotophoresis 4mg /ml Dexamethasone , Manual therapy, and Re-evaluation.  PLAN FOR NEXT SESSION:  progress strength/balance exercises, review/modify HEP as necessary; blaze pod reaching, cone taps, physioball rows, forward and lateral step-ups, rebounder balance perturbations  Selinda JONETTA Eck PT, DPT, GCS  Vernell Moats, SPT  07/20/2023, 9:47 AM

## 2023-07-21 ENCOUNTER — Ambulatory Visit

## 2023-07-21 DIAGNOSIS — R2681 Unsteadiness on feet: Secondary | ICD-10-CM | POA: Diagnosis not present

## 2023-07-21 DIAGNOSIS — M6281 Muscle weakness (generalized): Secondary | ICD-10-CM

## 2023-07-21 NOTE — Therapy (Signed)
 OUTPATIENT PHYSICAL THERAPY BALANCE TREATMENT  Patient Name: Wesley Preston. MRN: [id number] DOB:Nov 23, 1940, 83 y.o., male Today's Date: 07/21/2023  END OF SESSION:  PT End of Session - 07/21/23 1021     Visit Number 13    Number of Visits 25    Date for PT Re-Evaluation 08/22/23    Authorization Type eval: 05/30/23    PT Start Time 1020    PT Stop Time 1100    PT Time Calculation (min) 40 min    Equipment Utilized During Treatment Gait belt    Activity Tolerance Patient tolerated treatment well    Behavior During Therapy WFL for tasks assessed/performed         Past Medical History:  Diagnosis Date   Anxiety    Back pain    BPH (benign prostatic hyperplasia)    Cancer (HCC)    BASAL CELL   Chest pain, non-cardiac    Colon polyps    Complication of anesthesia    anesthesia stays a long time   COPD (chronic obstructive pulmonary disease) (HCC)    Depression    Essential tremor    Essential tremor    Family history of adverse reaction to anesthesia    DAUGHTERS GET NAUSEATED   GERD (gastroesophageal reflux disease)    H/O   Helicobacter pylori (H. pylori) infection    Hyperlipidemia    Neuromuscular disorder (HCC)    TREMORS   Reactive airway disease    Reactive airway disease    Restless leg syndrome    Past Surgical History:  Procedure Laterality Date   COLONOSCOPY  2013   COLONOSCOPY WITH PROPOFOL  N/A 03/21/2016   Procedure: COLONOSCOPY WITH PROPOFOL ;  Surgeon: Lamar ONEIDA Holmes, MD;  Location: [facility] ENDOSCOPY;  Service: Endoscopy;  Laterality: N/A;   COLONOSCOPY WITH PROPOFOL  N/A 03/09/2018   Procedure: COLONOSCOPY WITH PROPOFOL ;  Surgeon: Holmes Lamar ONEIDA, MD;  Location: [facility] ENDOSCOPY;  Service: Endoscopy;  Laterality: N/A;   ESOPHAGOGASTRODUODENOSCOPY     ESOPHAGOGASTRODUODENOSCOPY N/A 03/09/2018   Procedure: ESOPHAGOGASTRODUODENOSCOPY (EGD);  Surgeon: Holmes Lamar ONEIDA, MD;  Location: [facility] ENDOSCOPY;  Service: Endoscopy;  Laterality: N/A;   HERNIA REPAIR      HOLEP-LASER ENUCLEATION OF THE PROSTATE WITH MORCELLATION N/A 05/10/2019   Procedure: HOLEP-LASER ENUCLEATION OF THE PROSTATE WITH MORCELLATION;  Surgeon: Francisca Redell BROCKS, MD;  Location: [facility] ORS;  Service: Urology;  Laterality: N/A;   INGUINAL HERNIA REPAIR Right 08/01/2014   Procedure: RIGHT INGUINAL HERNIA  REPAIR WITH MESH ;  Surgeon: Reyes LELON Cota, MD;  Location: [facility] ORS;  Service: General;  Laterality: Right; with large Ultra Pro mesh   NASAL SINUS SURGERY     ROTATOR CUFF REPAIR     TRIGGER FINGER RELEASE     Patient Active Problem List   Diagnosis Date Noted   Postprocedural stricture of urethra at fossa navicularis 07/09/2019   BPH with obstruction/lower urinary tract symptoms 08/21/2018   Restless leg syndrome 07/24/2018   Reactive airway disease 07/24/2018   Pure hypercholesterolemia 07/24/2018   Mechanical back pain 07/24/2018   History of Helicobacter pylori infection 07/24/2018   Essential tremor 07/24/2018   Depression 07/24/2018   BPH associated with nocturia 07/24/2018   Benign essential hypertension 07/24/2018   Anxiety 07/24/2018   Trigger finger of left thumb 04/14/2017   Hand pain, left 04/14/2017   Visual disturbance 07/18/2016   Tremor 07/18/2016   Imbalance 07/18/2016   Dizziness 07/18/2016   Right inguinal hernia 07/12/2014    PCP: Auston Reyes  D, MD  REFERRING PROVIDER: Auston Reyes BIRCH, MD   REFERRING DIAG:  R26.89 (ICD-10-CM) - Other abnormalities of gait and mobility  R42 (ICD-10-CM) - Dizziness and giddiness   RATIONALE FOR EVALUATION AND TREATMENT: Rehabilitation  THERAPY DIAG: Unsteadiness on feet  Muscle weakness (generalized)  ONSET DATE: Multiple years  FOLLOW-UP APPT SCHEDULED WITH REFERRING PROVIDER: Yes, neurology;  FROM INITIAL EVALUATION SUBJECTIVE:                                                                                                                                                                                          SUBJECTIVE STATEMENT:  Unsteadiness  PERTINENT HISTORY:  Patient with worsening gait and balance. Feels he is dragging his feet. Infrequently using assistive device. Improved RLS since starting gabapentin. Intermittent episodes of vertigo with position changes.  He goes to [employer] for exercise 1-2x/wk. Ongoing tremors in bilateral hands, worse with activity or stress. He reports 4 falls with the most recent about 1 week ago. Pt reports that he was stepping backwards when he fell. He couldn't get up and had to call extended family to help him from the ground. His wife was not strong enough to assist. He denies any significant injury from the fall. He was referred to PT for sensory ataxia and it was recommended he use a walker as well as perform balance exercise.  08/02/2022 MR BRAIN W WO MR ORBITS W WO IMPRESSION:  1. Atrophic right optic nerve which may reflect sequela of prior  optic neuritis.  2. Otherwise unremarkable appearance of the orbits with no acute  finding.  3. No acute intracranial pathology.  4. Moderate background chronic small-vessel ischemic change.   12/30/2019 MR BRAIN WO IMPRESSION:  No evidence of recent infarction, hemorrhage, or mass. Mild progression of parenchymal volume loss and probable chronic microvascular ischemic changes since 2018. Small chronic left pontine infarcts.   06/10/16 CAROTID ARTERY ULTRASOUND  IMPRESSION: 1. Minimal bilateral carotid bifurcation atherosclerotic vascular plaque. No flow limiting stenosis. Degree of stenosis less than 50% bilaterally.  2. Vertebrals are patent with antegrade flow.  06/02/16  MRI BRAIN WO CONTRAST  IMPRESSION: Atrophy. Small vessel disease. Evidence for chronic brainstem lacunar infarction. No acute intracranial findings. Chronic pansinusitis. Despite prior surgery, significant fluid accumulation and mucosal thickening can be seen, particularly in the RIGHT greater than LEFT frontoethmoid  regions.    Pain: No Numbness/Tingling: No, but reports some feet burning; Focal Weakness: No Recent changes in overall health/medication: Yes, adjustment of meds Prior history of physical therapy for balance:  No Dominant hand: right Imaging: Yes  Red flags: Positive for basal cell carcinoma, Negative  for abdominal pain, chills/fever, night sweats, nausea, vomiting, unrelenting pain  PRECAUTIONS: Fall  WEIGHT BEARING RESTRICTIONS: No  FALLS: Has patient fallen in last 6 months? Yes. Number of falls 1, Directional pattern for falls: No  Living Environment Lives with: lives with their spouse Lives in: House/apartment Stairs: one level home, 2 steps to enter from the garage with L rail during ascend.  Has following equipment at home: grab bars in walk-in shower, no seat, rollator (uses periodically)   Prior level of function: Independent  Occupational demands: Retired Nutritional therapist: hunting (I can't get into the stand anymore), previously like to play golf but he can no longer bend over to put the ball on the tee;  Patient Goals: Pt would like to improve his balance   OBJECTIVE:   Patient Surveys  ABC: To be completed  Cognition Patient is oriented to person, place, and time.  Recent memory is intact.  Remote memory is intact.  Attention span and concentration are intact.  Expressive speech is intact.  Patient's fund of knowledge is within normal limits for educational level.    Gross Musculoskeletal Assessment Tremor: BUE tremors observed, worsened with stress; Bulk: Normal Tone: Normal  Posture: Mild forward head and rounded shoulders  AROM Deferred  LE MMT: MMT (out of 5) Right  Left   Hip flexion 4 4-  Hip extension    Hip abduction (seated) 5 5  Hip adduction (seated) 5 5  Hip internal rotation    Hip external rotation    Knee flexion 5 5  Knee extension 4+ 4+  Ankle dorsiflexion 4+ 4+  Ankle plantarflexion Active Active  (* = pain;  Blank rows = not tested)  Sensation Grossly intact to light touch throughout bilateral LEs as determined by testing dermatomes L2-S2. Proprioception, stereognosis, and hot/cold testing deferred on this date.  Reflexes R/L Knee Jerk (L3/4): 2+/2+  Ankle Jerk (S1/2): 2+/2+   Cranial Nerves Deferred  Coordination/Cerebellar Deferred  Transfers: Assistive device utilized: None  Sit to stand: Complete Independence Stand to sit: Complete Independence Chair to chair: Complete Independence Floor: Not assessed  Stairs: Level of Assistance: Complete Independence Stair Negotiation Technique: Alternating Pattern  with Bilateral Rails Number of Stairs: 4  Height of Stairs: 6  Comments: Decreased speed but good stability noted with single or bilateral UE support on rails  Gait: Gait pattern: decreased step length- Right, decreased step length- Left, poor foot clearance- Right, and poor foot clearance- Left Distance walked: 100' Assistive device utilized: None Level of assistance: Complete Independence Comments: Pt ambulates with decreased self-selected speed. Decreased step length bilaterally  BPPV TESTS:  Symptoms Duration Intensity Nystagmus  L Dix-Hallpike None   None  R Dix-Hallpike None   None  L Head Roll None   None  R Head Roll None   None  L Sidelying Test      R Sidelying Test      (blank = not tested)  mCTSIB: 5.4s  Functional Outcome Measures  05/30/23 06/07/23 Comments  BERG 49/56    DGI  17/24   FGA     TUG 19.7 seconds    5TSTS 13.0 seconds    6 Minute Walk Test     10 Meter Gait Speed Self-selected: 15.2s = 0.66 m/s; Fastest: 11.8s = 0.85 m/s    (Blank rows = not tested)  Coordination/Cerebellar Testing Finger to Nose: Dysmetria BUE but difficult to assess secondary to tremor; Heel to Shin: WNL Rapid alternating movements: WNL Finger Opposition: WNL  Pronator Drift: Negative  Functional Outcome Measures 07/12/23  05/30/23 06/07/23 07/12/23  BERG 49/56   52/56  DGI  17/24   FGA     TUG 19.7 seconds  13.51 seconds  5TSTS 13.0 seconds  11.30 seconds  6 Minute Walk Test     10 Meter Gait Speed Self-selected: 15.2s = 0.66 m/s; Fastest: 11.8s = 0.85 m/s  Self-Selected: 13.46= 0.74 m/s; Fastest: 11.13 = 0.90 m/s  (Blank rows = not tested)   TODAY'S TREATMENT: 07/21/23    SUBJECTIVE: Pt reports that he is doing well today. No changes since the last therapy session. Denies pertinent pain. Denies falls since last session. No specific questions or concerns.    PAIN: Denies   Ther-Act  NuStep L5 x 10 mins for BLE strengthening and warm-up during interval history;  MidRow Black TB 2 x 10 x 3 sec scap squeeze; Pallof Press Black TB  2 x 10 x 3 sec scap squeeze hold;  Standing exercises with 5# AW: Hip flexion marches x 20 BLE; Hamstring curls x 20 BLE; Hip abduction x 20 BLE; Hip extension x 20 BLE;  STS with #6 chest press 2 x 20; Mini-squats x 20;   Neuromuscular Re-ed Tandem stance with no UE support, 2 x 30 ea foot forward;  SLS with 2 finger UE support, 3 x 30 ea foot;   Not Performed this session:  Obstacle course in hallway, step ups, balancing, backwards and forwards walking 4 x 50 ft  Cross-body marches with 5# ankle weights (AW) x 3 laps in // bars; Seated adductor ball squeezes 2 x 10 x 5 BLE; Airex feet apart eyes open/closed x 30s each; Airex feet apart eyes open with horizontal and vertical head turns x 30s each; Seated clams with manual resistance from therapist x 10 BLE; Seated alternating LAQ with 5# AW x 10 BLE; Airex balance beam side stepping x multiple laps; Airex balance beam tandem stepping x multiple laps; Airex feet together eyes open/closed x 30s each; Airex feet together eyes open with horizontal and vertical head turns x 30s each; All balance exercises performed in parallel bars and without UE support unless otherwise specified: Fishing game reaching outside of BOS with no UE support x1 ea hand;   Golf chipping with ball pick up x 3 bouts x 5 balls; Blazepod balance on AE pad, semicircle reaching outside BOS, 6 pods, random light-up, 1 min, Trial 1: 19 hits, Trial 2: 22 hits, Trial 3: 19 hits; STS with #6 chest press throw to rebounder 2 x 10;  Backwards walking outside with cone pick-ups off floor 4 x 50 ft;  Forwards walking with ball toss to SPT outside of BOS 4 x 50 ft;    PATIENT EDUCATION:  Education details: Pt educated throughout session about proper posture and technique with exercises. Improved exercise technique, movement at target joints, use of target muscles after min to mod verbal, visual, tactile cues.  Person educated: Patient Education method: Explanation, Demonstration, Verbal cues, and Handouts Education comprehension: verbalized understanding   HOME EXERCISE PROGRAM:  Access Code: [email or web address] URL: [email or web address] Date: 06/07/2023 Prepared by: Selinda Eck  Exercises - Sit to Stand Without Arm Support  - 1 x daily - 7 x weekly - 3 sets - 10 reps - Heel Raises with Counter Support  - 1 x daily - 7 x weekly - 3 sets - 10 reps - Standing Romberg to 1/2 Tandem Stance  - 1 x daily - 7 x weekly - 3 reps -  30s hold  ASSESSMENT:  CLINICAL IMPRESSION: Notable BUE tremor present during today's session. Pt was able to continue using 5# AW this session to progress strengthening exercises. Tandem stance was done to encourage narrow BOS and to challenge the pt's balance. SLS was done to train the pt's balance and help him become more functional with SL tasks such as stairs and ambulation. No HEP modifications at this time. Pt encouraged to follow-up as scheduled. Pt would benefit from continued physical therapy services at this time so he can increase his strength, decrease his ambulation falls risk, and improve his balance so he can participate safely in his environment.    OBJECTIVE IMPAIRMENTS: Abnormal gait, decreased balance, difficulty walking,  decreased strength, and dizziness.   ACTIVITY LIMITATIONS: standing, stairs, transfers, and caring for others  PARTICIPATION LIMITATIONS: meal prep, cleaning, laundry, shopping, and community activity  PERSONAL FACTORS: Age, Past/current experiences, Time since onset of injury/illness/exacerbation, and 3+ comorbidities: back pain, tremors, anxiety, depression are also affecting patient's functional outcome.   REHAB POTENTIAL: Good  CLINICAL DECISION MAKING: Unstable/unpredictable  EVALUATION COMPLEXITY: High   GOALS: Goals reviewed with patient? No  SHORT TERM GOALS: Target date: 07/11/2023  Pt will be independent with HEP in order to improve strength and balance in order to decrease fall risk and improve function at home. Baseline:  Goal status: INITIAL   LONG TERM GOALS: Target date: 08/22/2023   Pt will improve ABC by at least 13% in order to demonstrate clinically significant improvement in balance confidence.  Baseline: 62.2%, 07/12/23: 70% Goal status: IN PROGRESS  2.  Pt will improve BERG by at least 3 points in order to demonstrate clinically significant improvement in balance.   Baseline: 49/56; 07/12/23: 52/56 Goal status: MET  3.  Pt will decrease TUG to below 14 seconds/decrease in order to demonstrate decreased fall risk.       Baseline: 19.7s, 07/12/23: 13.51 sec Goal status: MET  4. Pt will increase self-selected 92m gait speed to at least 0.80 m/s in order to demonstrate clinically significant improvement in community ambulation.     Baseline: 0.66 m/s, 07/12/23: 0.74 m/s Goal status: IN PROGRESS  5. Pt will increase by at least 93m (166ft) in order to demonstrate clinically significant improvement in cardiopulmonary endurance and community ambulation      Baseline: 06/09/23: 916' Goal status: INITIAL  PLAN: PT FREQUENCY: 2x/week  PT DURATION: 8 weeks  PLANNED INTERVENTIONS: Therapeutic exercises, Therapeutic activity, Neuromuscular re-education, Balance  training, Gait training, Patient/Family education, Self Care, Joint mobilization, Joint manipulation, Vestibular training, Canalith repositioning, Orthotic/Fit training, DME instructions, Dry Needling, Electrical stimulation, Spinal manipulation, Spinal mobilization, Cryotherapy, Moist heat, Taping, Traction, Ultrasound, Ionotophoresis 4mg /ml Dexamethasone , Manual therapy, and Re-evaluation.  PLAN FOR NEXT SESSION:  progress strength/balance exercises, review/modify HEP as necessary; blaze pod reaching, cone taps, physioball rows, forward and lateral step-ups, rebounder balance perturbations  Selinda JONETTA Eck PT, DPT, GCS  Vernell Moats, SPT  07/21/2023, 1:39 PM

## 2023-07-25 ENCOUNTER — Ambulatory Visit

## 2023-07-25 DIAGNOSIS — M6281 Muscle weakness (generalized): Secondary | ICD-10-CM

## 2023-07-25 DIAGNOSIS — R2681 Unsteadiness on feet: Secondary | ICD-10-CM

## 2023-07-25 NOTE — Therapy (Unsigned)
 OUTPATIENT PHYSICAL THERAPY BALANCE TREATMENT  Patient Name: Wesley Preston. MRN: [id number] DOB:10/26/1940, 83 y.o., male Today's Date: 07/27/2023  END OF SESSION:  PT End of Session - 07/27/23 0914     Visit Number 14    Number of Visits 25    Date for PT Re-Evaluation 08/22/23    Authorization Type eval: 05/30/23    PT Start Time 1325    PT Stop Time 1400    PT Time Calculation (min) 35 min    Equipment Utilized During Treatment Gait belt    Activity Tolerance Patient tolerated treatment well    Behavior During Therapy WFL for tasks assessed/performed          Past Medical History:  Diagnosis Date   Anxiety    Back pain    BPH (benign prostatic hyperplasia)    Cancer (HCC)    BASAL CELL   Chest pain, non-cardiac    Colon polyps    Complication of anesthesia    anesthesia stays a long time   COPD (chronic obstructive pulmonary disease) (HCC)    Depression    Essential tremor    Essential tremor    Family history of adverse reaction to anesthesia    DAUGHTERS GET NAUSEATED   GERD (gastroesophageal reflux disease)    H/O   Helicobacter pylori (H. pylori) infection    Hyperlipidemia    Neuromuscular disorder (HCC)    TREMORS   Reactive airway disease    Reactive airway disease    Restless leg syndrome    Past Surgical History:  Procedure Laterality Date   COLONOSCOPY  2013   COLONOSCOPY WITH PROPOFOL  N/A 03/21/2016   Procedure: COLONOSCOPY WITH PROPOFOL ;  Surgeon: Lamar ONEIDA Holmes, MD;  Location[facility] ENDOSCOPY;  Service: Endoscopy;  Laterality: N/A;   COLONOSCOPY WITH PROPOFOL  N/A 03/09/2018   Procedure: COLONOSCOPY WITH PROPOFOL ;  Surgeon: Holmes Lamar ONEIDA, MD;  Location: [facility] ENDOSCOPY;  Service: Endoscopy;  Laterality: N/A;   ESOPHAGOGASTRODUODENOSCOPY     ESOPHAGOGASTRODUODENOSCOPY N/A 03/09/2018   Procedure: ESOPHAGOGASTRODUODENOSCOPY (EGD);  Surgeon: Holmes Lamar ONEIDA, MD;  Location: [facility] ENDOSCOPY;  Service: Endoscopy;  Laterality: N/A;   HERNIA REPAIR      HOLEP-LASER ENUCLEATION OF THE PROSTATE WITH MORCELLATION N/A 05/10/2019   Procedure: HOLEP-LASER ENUCLEATION OF THE PROSTATE WITH MORCELLATION;  Surgeon: Francisca Redell BROCKS, MD;  Location: [facility] ORS;  Service: Urology;  Laterality: N/A;   INGUINAL HERNIA REPAIR Right 08/01/2014   Procedure: RIGHT INGUINAL HERNIA  REPAIR WITH MESH ;  Surgeon: Reyes LELON Cota, MD;  Location: [facility] ORS;  Service: General;  Laterality: Right; with large Ultra Pro mesh   NASAL SINUS SURGERY     ROTATOR CUFF REPAIR     TRIGGER FINGER RELEASE     Patient Active Problem List   Diagnosis Date Noted   Postprocedural stricture of urethra at fossa navicularis 07/09/2019   BPH with obstruction/lower urinary tract symptoms 08/21/2018   Restless leg syndrome 07/24/2018   Reactive airway disease 07/24/2018   Pure hypercholesterolemia 07/24/2018   Mechanical back pain 07/24/2018   History of Helicobacter pylori infection 07/24/2018   Essential tremor 07/24/2018   Depression 07/24/2018   BPH associated with nocturia 07/24/2018   Benign essential hypertension 07/24/2018   Anxiety 07/24/2018   Trigger finger of left thumb 04/14/2017   Hand pain, left 04/14/2017   Visual disturbance 07/18/2016   Tremor 07/18/2016   Imbalance 07/18/2016   Dizziness 07/18/2016   Right inguinal hernia 07/12/2014    PCP: Auston,  Reyes BIRCH, MD  REFERRING PROVIDER: Auston Reyes BIRCH, MD   REFERRING DIAG:  R26.89 (ICD-10-CM) - Other abnormalities of gait and mobility  R42 (ICD-10-CM) - Dizziness and giddiness   RATIONALE FOR EVALUATION AND TREATMENT: Rehabilitation  THERAPY DIAG: Unsteadiness on feet  Muscle weakness (generalized)  ONSET DATE: Multiple years  FOLLOW-UP APPT SCHEDULED WITH REFERRING PROVIDER: Yes, neurology;  FROM INITIAL EVALUATION SUBJECTIVE:                                                                                                                                                                                          SUBJECTIVE STATEMENT:  Unsteadiness  PERTINENT HISTORY:  Patient with worsening gait and balance. Feels he is dragging his feet. Infrequently using assistive device. Improved RLS since starting gabapentin. Intermittent episodes of vertigo with position changes.  He goes to [employer] for exercise 1-2x/wk. Ongoing tremors in bilateral hands, worse with activity or stress. He reports 4 falls with the most recent about 1 week ago. Pt reports that he was stepping backwards when he fell. He couldn't get up and had to call extended family to help him from the ground. His wife was not strong enough to assist. He denies any significant injury from the fall. He was referred to PT for sensory ataxia and it was recommended he use a walker as well as perform balance exercise.  08/02/2022 MR BRAIN W WO MR ORBITS W WO IMPRESSION:  1. Atrophic right optic nerve which may reflect sequela of prior  optic neuritis.  2. Otherwise unremarkable appearance of the orbits with no acute  finding.  3. No acute intracranial pathology.  4. Moderate background chronic small-vessel ischemic change.   12/30/2019 MR BRAIN WO IMPRESSION:  No evidence of recent infarction, hemorrhage, or mass. Mild progression of parenchymal volume loss and probable chronic microvascular ischemic changes since 2018. Small chronic left pontine infarcts.   06/10/16 CAROTID ARTERY ULTRASOUND  IMPRESSION: 1. Minimal bilateral carotid bifurcation atherosclerotic vascular plaque. No flow limiting stenosis. Degree of stenosis less than 50% bilaterally.  2. Vertebrals are patent with antegrade flow.  06/02/16  MRI BRAIN WO CONTRAST  IMPRESSION: Atrophy. Small vessel disease. Evidence for chronic brainstem lacunar infarction. No acute intracranial findings. Chronic pansinusitis. Despite prior surgery, significant fluid accumulation and mucosal thickening can be seen, particularly in the RIGHT greater than LEFT frontoethmoid  regions.    Pain: No Numbness/Tingling: No, but reports some feet burning; Focal Weakness: No Recent changes in overall health/medication: Yes, adjustment of meds Prior history of physical therapy for balance:  No Dominant hand: right Imaging: Yes  Red flags: Positive for basal cell carcinoma,  Negative for abdominal pain, chills/fever, night sweats, nausea, vomiting, unrelenting pain  PRECAUTIONS: Fall  WEIGHT BEARING RESTRICTIONS: No  FALLS: Has patient fallen in last 6 months? Yes. Number of falls 1, Directional pattern for falls: No  Living Environment Lives with: lives with their spouse Lives in: House/apartment Stairs: one level home, 2 steps to enter from the garage with L rail during ascend.  Has following equipment at home: grab bars in walk-in shower, no seat, rollator (uses periodically)   Prior level of function: Independent  Occupational demands: Retired Nutritional therapist: hunting (I can't get into the stand anymore), previously like to play golf but he can no longer bend over to put the ball on the tee;  Patient Goals: Pt would like to improve his balance   OBJECTIVE:   Patient Surveys  ABC: To be completed  Cognition Patient is oriented to person, place, and time.  Recent memory is intact.  Remote memory is intact.  Attention span and concentration are intact.  Expressive speech is intact.  Patient's fund of knowledge is within normal limits for educational level.    Gross Musculoskeletal Assessment Tremor: BUE tremors observed, worsened with stress; Bulk: Normal Tone: Normal  Posture: Mild forward head and rounded shoulders  AROM Deferred  LE MMT: MMT (out of 5) Right  Left   Hip flexion 4 4-  Hip extension    Hip abduction (seated) 5 5  Hip adduction (seated) 5 5  Hip internal rotation    Hip external rotation    Knee flexion 5 5  Knee extension 4+ 4+  Ankle dorsiflexion 4+ 4+  Ankle plantarflexion Active Active  (* = pain;  Blank rows = not tested)  Sensation Grossly intact to light touch throughout bilateral LEs as determined by testing dermatomes L2-S2. Proprioception, stereognosis, and hot/cold testing deferred on this date.  Reflexes R/L Knee Jerk (L3/4): 2+/2+  Ankle Jerk (S1/2): 2+/2+   Cranial Nerves Deferred  Coordination/Cerebellar Deferred  Transfers: Assistive device utilized: None  Sit to stand: Complete Independence Stand to sit: Complete Independence Chair to chair: Complete Independence Floor: Not assessed  Stairs: Level of Assistance: Complete Independence Stair Negotiation Technique: Alternating Pattern  with Bilateral Rails Number of Stairs: 4  Height of Stairs: 6  Comments: Decreased speed but good stability noted with single or bilateral UE support on rails  Gait: Gait pattern: decreased step length- Right, decreased step length- Left, poor foot clearance- Right, and poor foot clearance- Left Distance walked: 100' Assistive device utilized: None Level of assistance: Complete Independence Comments: Pt ambulates with decreased self-selected speed. Decreased step length bilaterally  BPPV TESTS:  Symptoms Duration Intensity Nystagmus  L Dix-Hallpike None   None  R Dix-Hallpike None   None  L Head Roll None   None  R Head Roll None   None  L Sidelying Test      R Sidelying Test      (blank = not tested)  mCTSIB: 5.4s  Functional Outcome Measures  05/30/23 06/07/23 Comments  BERG 49/56    DGI  17/24   FGA     TUG 19.7 seconds    5TSTS 13.0 seconds    6 Minute Walk Test     10 Meter Gait Speed Self-selected: 15.2s = 0.66 m/s; Fastest: 11.8s = 0.85 m/s    (Blank rows = not tested)  Coordination/Cerebellar Testing Finger to Nose: Dysmetria BUE but difficult to assess secondary to tremor; Heel to Shin: WNL Rapid alternating movements: WNL Finger Opposition:  WNL Pronator Drift: Negative  Functional Outcome Measures 07/12/23  05/30/23 06/07/23 07/12/23  BERG 49/56   52/56  DGI  17/24   FGA     TUG 19.7 seconds  13.51 seconds  5TSTS 13.0 seconds  11.30 seconds  6 Minute Walk Test     10 Meter Gait Speed Self-selected: 15.2s = 0.66 m/s; Fastest: 11.8s = 0.85 m/s  Self-Selected: 13.46= 0.74 m/s; Fastest: 11.13 = 0.90 m/s  (Blank rows = not tested)   TODAY'S TREATMENT: 07/25/23    SUBJECTIVE: Pt reports that he is doing well today. No changes since the last therapy session. Denies pertinent pain. Denies falls since last session. No specific questions or concerns.    PAIN: Denies   Ther-Act  NuStep L5 x 10 mins (5 mins unbilled) for BLE strengthening and warm-up during interval history;  Golf chipping with ball pick up x 4 bouts x 5 balls; STS with #6 chest press throw to rebounder 2 x 20; Mini-squats x 20; Forward marching with 5 # AW 4 bouts x 16 ft;  Backward marching with 5 # AW 4 bouts x 16 ft;  Sidestepping with 5 # AW 4 bouts x 16 ft;    Not Performed this session:  Obstacle course in hallway, step ups, balancing, backwards and forwards walking 4 x 50 ft  Cross-body marches with 5# ankle weights (AW) x 3 laps in // bars; Seated adductor ball squeezes 2 x 10 x 5 BLE; Airex feet apart eyes open/closed x 30s each; Airex feet apart eyes open with horizontal and vertical head turns x 30s each; Seated clams with manual resistance from therapist x 10 BLE; Seated alternating LAQ with 5# AW x 10 BLE; Airex balance beam side stepping x multiple laps; Airex balance beam tandem stepping x multiple laps; Airex feet together eyes open/closed x 30s each; Airex feet together eyes open with horizontal and vertical head turns x 30s each; All balance exercises performed in parallel bars and without UE support unless otherwise specified: Fishing game reaching outside of BOS with no UE support x1 ea hand;  Blazepod balance on AE pad, semicircle reaching outside BOS, 6 pods, random light-up, 1 min, Trial 1: 19 hits, Trial 2: 22 hits, Trial 3: 19  hits; STS with #6 chest press throw to rebounder 2 x 10;  Backwards walking outside with cone pick-ups off floor 4 x 50 ft;  Forwards walking with ball toss to SPT outside of BOS 4 x 50 ft;  Tandem stance with no UE support, 2 x 30 ea foot forward;  SLS with 2 finger UE support, 3 x 30 ea foot; MidRow Black TB 2 x 10 x 3 sec scap squeeze; Pallof Press Black TB  2 x 10 x 3 sec scap squeeze hold; Standing exercises with 5# AW: Hip flexion marches x 20 BLE; Hamstring curls x 20 BLE; Hip abduction x 20 BLE; Hip extension x 20 BLE;  PATIENT EDUCATION:  Education details: Pt educated throughout session about proper posture and technique with exercises. Improved exercise technique, movement at target joints, use of target muscles after min to mod verbal, visual, tactile cues.  Person educated: Patient Education method: Explanation, Demonstration, Verbal cues, and Handouts Education comprehension: verbalized understanding   HOME EXERCISE PROGRAM:  Access Code: [email or web address] URL: [email or web address] Date: 06/07/2023 Prepared by: Selinda Eck  Exercises - Sit to Stand Without Arm Support  - 1 x daily - 7 x weekly - 3 sets - 10 reps - Heel Raises with Counter  Support  - 1 x daily - 7 x weekly - 3 sets - 10 reps - Standing Romberg to 1/2 Tandem Stance  - 1 x daily - 7 x weekly - 3 reps - 30s hold  ASSESSMENT:  CLINICAL IMPRESSION: Pt arrived 10 minutes late to session today so session abbreviated accordingly due to schedule constraints. Notable BUE tremor present during today's session. Pt was able to continue using 5# AW this session to progress strengthening exercises. Golf chipping was done because it is one of the pt's hobbies and to increase his functional ability to pick objects off the floor. No HEP modifications at this time. Pt encouraged to follow-up as scheduled. Pt would benefit from continued physical therapy services at this time so he can increase his strength,  decrease his ambulation falls risk, and improve his balance so he can participate safely in his environment.    OBJECTIVE IMPAIRMENTS: Abnormal gait, decreased balance, difficulty walking, decreased strength, and dizziness.   ACTIVITY LIMITATIONS: standing, stairs, transfers, and caring for others  PARTICIPATION LIMITATIONS: meal prep, cleaning, laundry, shopping, and community activity  PERSONAL FACTORS: Age, Past/current experiences, Time since onset of injury/illness/exacerbation, and 3+ comorbidities: back pain, tremors, anxiety, depression are also affecting patient's functional outcome.   REHAB POTENTIAL: Good  CLINICAL DECISION MAKING: Unstable/unpredictable  EVALUATION COMPLEXITY: High   GOALS: Goals reviewed with patient? No  SHORT TERM GOALS: Target date: 07/11/2023  Pt will be independent with HEP in order to improve strength and balance in order to decrease fall risk and improve function at home. Baseline:  Goal status: INITIAL   LONG TERM GOALS: Target date: 08/22/2023   Pt will improve ABC by at least 13% in order to demonstrate clinically significant improvement in balance confidence.  Baseline: 62.2%, 07/12/23: 70% Goal status: IN PROGRESS  2.  Pt will improve BERG by at least 3 points in order to demonstrate clinically significant improvement in balance.   Baseline: 49/56; 07/12/23: 52/56 Goal status: MET  3.  Pt will decrease TUG to below 14 seconds/decrease in order to demonstrate decreased fall risk.       Baseline: 19.7s, 07/12/23: 13.51 sec Goal status: MET  4. Pt will increase self-selected 109m gait speed to at least 0.80 m/s in order to demonstrate clinically significant improvement in community ambulation.     Baseline: 0.66 m/s, 07/12/23: 0.74 m/s Goal status: IN PROGRESS  5. Pt will increase by at least 56m (112ft) in order to demonstrate clinically significant improvement in cardiopulmonary endurance and community ambulation      Baseline: 06/09/23:  916' Goal status: INITIAL  PLAN: PT FREQUENCY: 2x/week  PT DURATION: 8 weeks  PLANNED INTERVENTIONS: Therapeutic exercises, Therapeutic activity, Neuromuscular re-education, Balance training, Gait training, Patient/Family education, Self Care, Joint mobilization, Joint manipulation, Vestibular training, Canalith repositioning, Orthotic/Fit training, DME instructions, Dry Needling, Electrical stimulation, Spinal manipulation, Spinal mobilization, Cryotherapy, Moist heat, Taping, Traction, Ultrasound, Ionotophoresis 4mg /ml Dexamethasone , Manual therapy, and Re-evaluation.  PLAN FOR NEXT SESSION:  progress strength/balance exercises, review/modify HEP as necessary; blaze pod reaching, cone taps, physioball rows, forward and lateral step-ups, rebounder balance perturbations  Selinda JONETTA Eck PT, DPT, GCS  Vernell Moats, SPT  07/27/2023, 9:14 AM

## 2023-07-27 ENCOUNTER — Ambulatory Visit

## 2023-07-27 DIAGNOSIS — M6281 Muscle weakness (generalized): Secondary | ICD-10-CM

## 2023-07-27 DIAGNOSIS — R2681 Unsteadiness on feet: Secondary | ICD-10-CM | POA: Diagnosis not present

## 2023-07-27 NOTE — Therapy (Signed)
 OUTPATIENT PHYSICAL THERAPY BALANCE TREATMENT  Patient Name: Wesley Preston. MRN: [id number] DOB:1940-12-15, 83 y.o., male Today's Date: 07/27/2023  END OF SESSION:  PT End of Session - 07/27/23 1325     Visit Number 15    Number of Visits 25    Date for PT Re-Evaluation 08/22/23    Authorization Type eval: 05/30/23    PT Start Time 1325    PT Stop Time 1400    PT Time Calculation (min) 35 min    Equipment Utilized During Treatment Gait belt    Activity Tolerance Patient tolerated treatment well    Behavior During Therapy WFL for tasks assessed/performed          Past Medical History:  Diagnosis Date   Anxiety    Back pain    BPH (benign prostatic hyperplasia)    Cancer (HCC)    BASAL CELL   Chest pain, non-cardiac    Colon polyps    Complication of anesthesia    anesthesia stays a long time   COPD (chronic obstructive pulmonary disease) (HCC)    Depression    Essential tremor    Essential tremor    Family history of adverse reaction to anesthesia    DAUGHTERS GET NAUSEATED   GERD (gastroesophageal reflux disease)    H/O   Helicobacter pylori (H. pylori) infection    Hyperlipidemia    Neuromuscular disorder (HCC)    TREMORS   Reactive airway disease    Reactive airway disease    Restless leg syndrome    Past Surgical History:  Procedure Laterality Date   COLONOSCOPY  2013   COLONOSCOPY WITH PROPOFOL  N/A 03/21/2016   Procedure: COLONOSCOPY WITH PROPOFOL ;  Surgeon: Lamar ONEIDA Holmes, MD;  Location: [facility] ENDOSCOPY;  Service: Endoscopy;  Laterality: N/A;   COLONOSCOPY WITH PROPOFOL  N/A 03/09/2018   Procedure: COLONOSCOPY WITH PROPOFOL ;  Surgeon: Holmes Lamar ONEIDA, MD;  Location: [facility] ENDOSCOPY;  Service: Endoscopy;  Laterality: N/A;   ESOPHAGOGASTRODUODENOSCOPY     ESOPHAGOGASTRODUODENOSCOPY N/A 03/09/2018   Procedure: ESOPHAGOGASTRODUODENOSCOPY (EGD);  Surgeon: Holmes Lamar ONEIDA, MD;  Location: [facility] ENDOSCOPY;  Service: Endoscopy;  Laterality: N/A;   HERNIA REPAIR      HOLEP-LASER ENUCLEATION OF THE PROSTATE WITH MORCELLATION N/A 05/10/2019   Procedure: HOLEP-LASER ENUCLEATION OF THE PROSTATE WITH MORCELLATION;  Surgeon: Francisca Redell BROCKS, MD;  Location: [facility] ORS;  Service: Urology;  Laterality: N/A;   INGUINAL HERNIA REPAIR Right 08/01/2014   Procedure: RIGHT INGUINAL HERNIA  REPAIR WITH MESH ;  Surgeon: Reyes LELON Cota, MD;  Location: [facility] ORS;  Service: General;  Laterality: Right; with large Ultra Pro mesh   NASAL SINUS SURGERY     ROTATOR CUFF REPAIR     TRIGGER FINGER RELEASE     Patient Active Problem List   Diagnosis Date Noted   Postprocedural stricture of urethra at fossa navicularis 07/09/2019   BPH with obstruction/lower urinary tract symptoms 08/21/2018   Restless leg syndrome 07/24/2018   Reactive airway disease 07/24/2018   Pure hypercholesterolemia 07/24/2018   Mechanical back pain 07/24/2018   History of Helicobacter pylori infection 07/24/2018   Essential tremor 07/24/2018   Depression 07/24/2018   BPH associated with nocturia 07/24/2018   Benign essential hypertension 07/24/2018   Anxiety 07/24/2018   Trigger finger of left thumb 04/14/2017   Hand pain, left 04/14/2017   Visual disturbance 07/18/2016   Tremor 07/18/2016   Imbalance 07/18/2016   Dizziness 07/18/2016   Right inguinal hernia 07/12/2014    PCP: Auston,  Reyes BIRCH, MD  REFERRING PROVIDER: Auston Reyes BIRCH, MD   REFERRING DIAG:  R26.89 (ICD-10-CM) - Other abnormalities of gait and mobility  R42 (ICD-10-CM) - Dizziness and giddiness   RATIONALE FOR EVALUATION AND TREATMENT: Rehabilitation  THERAPY DIAG: Unsteadiness on feet  Muscle weakness (generalized)  ONSET DATE: Multiple years  FOLLOW-UP APPT SCHEDULED WITH REFERRING PROVIDER: Yes, neurology;  FROM INITIAL EVALUATION SUBJECTIVE:                                                                                                                                                                                          SUBJECTIVE STATEMENT:  Unsteadiness  PERTINENT HISTORY:  Patient with worsening gait and balance. Feels he is dragging his feet. Infrequently using assistive device. Improved RLS since starting gabapentin. Intermittent episodes of vertigo with position changes.  He goes to [employer] for exercise 1-2x/wk. Ongoing tremors in bilateral hands, worse with activity or stress. He reports 4 falls with the most recent about 1 week ago. Pt reports that he was stepping backwards when he fell. He couldn't get up and had to call extended family to help him from the ground. His wife was not strong enough to assist. He denies any significant injury from the fall. He was referred to PT for sensory ataxia and it was recommended he use a walker as well as perform balance exercise.  08/02/2022 MR BRAIN W WO MR ORBITS W WO IMPRESSION:  1. Atrophic right optic nerve which may reflect sequela of prior  optic neuritis.  2. Otherwise unremarkable appearance of the orbits with no acute  finding.  3. No acute intracranial pathology.  4. Moderate background chronic small-vessel ischemic change.   12/30/2019 MR BRAIN WO IMPRESSION:  No evidence of recent infarction, hemorrhage, or mass. Mild progression of parenchymal volume loss and probable chronic microvascular ischemic changes since 2018. Small chronic left pontine infarcts.   06/10/16 CAROTID ARTERY ULTRASOUND  IMPRESSION: 1. Minimal bilateral carotid bifurcation atherosclerotic vascular plaque. No flow limiting stenosis. Degree of stenosis less than 50% bilaterally.  2. Vertebrals are patent with antegrade flow.  06/02/16  MRI BRAIN WO CONTRAST  IMPRESSION: Atrophy. Small vessel disease. Evidence for chronic brainstem lacunar infarction. No acute intracranial findings. Chronic pansinusitis. Despite prior surgery, significant fluid accumulation and mucosal thickening can be seen, particularly in the RIGHT greater than LEFT frontoethmoid  regions.    Pain: No Numbness/Tingling: No, but reports some feet burning; Focal Weakness: No Recent changes in overall health/medication: Yes, adjustment of meds Prior history of physical therapy for balance:  No Dominant hand: right Imaging: Yes  Red flags: Positive for basal cell carcinoma,  Negative for abdominal pain, chills/fever, night sweats, nausea, vomiting, unrelenting pain  PRECAUTIONS: Fall  WEIGHT BEARING RESTRICTIONS: No  FALLS: Has patient fallen in last 6 months? Yes. Number of falls 1, Directional pattern for falls: No  Living Environment Lives with: lives with their spouse Lives in: House/apartment Stairs: one level home, 2 steps to enter from the garage with L rail during ascend.  Has following equipment at home: grab bars in walk-in shower, no seat, rollator (uses periodically)   Prior level of function: Independent  Occupational demands: Retired Nutritional therapist: hunting (I can't get into the stand anymore), previously like to play golf but he can no longer bend over to put the ball on the tee;  Patient Goals: Pt would like to improve his balance   OBJECTIVE:   Patient Surveys  ABC: To be completed  Cognition Patient is oriented to person, place, and time.  Recent memory is intact.  Remote memory is intact.  Attention span and concentration are intact.  Expressive speech is intact.  Patient's fund of knowledge is within normal limits for educational level.    Gross Musculoskeletal Assessment Tremor: BUE tremors observed, worsened with stress; Bulk: Normal Tone: Normal  Posture: Mild forward head and rounded shoulders  AROM Deferred  LE MMT: MMT (out of 5) Right  Left   Hip flexion 4 4-  Hip extension    Hip abduction (seated) 5 5  Hip adduction (seated) 5 5  Hip internal rotation    Hip external rotation    Knee flexion 5 5  Knee extension 4+ 4+  Ankle dorsiflexion 4+ 4+  Ankle plantarflexion Active Active  (* = pain;  Blank rows = not tested)  Sensation Grossly intact to light touch throughout bilateral LEs as determined by testing dermatomes L2-S2. Proprioception, stereognosis, and hot/cold testing deferred on this date.  Reflexes R/L Knee Jerk (L3/4): 2+/2+  Ankle Jerk (S1/2): 2+/2+   Cranial Nerves Deferred  Coordination/Cerebellar Deferred  Transfers: Assistive device utilized: None  Sit to stand: Complete Independence Stand to sit: Complete Independence Chair to chair: Complete Independence Floor: Not assessed  Stairs: Level of Assistance: Complete Independence Stair Negotiation Technique: Alternating Pattern  with Bilateral Rails Number of Stairs: 4  Height of Stairs: 6  Comments: Decreased speed but good stability noted with single or bilateral UE support on rails  Gait: Gait pattern: decreased step length- Right, decreased step length- Left, poor foot clearance- Right, and poor foot clearance- Left Distance walked: 100' Assistive device utilized: None Level of assistance: Complete Independence Comments: Pt ambulates with decreased self-selected speed. Decreased step length bilaterally  BPPV TESTS:  Symptoms Duration Intensity Nystagmus  L Dix-Hallpike None   None  R Dix-Hallpike None   None  L Head Roll None   None  R Head Roll None   None  L Sidelying Test      R Sidelying Test      (blank = not tested)  mCTSIB: 5.4s  Functional Outcome Measures  05/30/23 06/07/23 Comments  BERG 49/56    DGI  17/24   FGA     TUG 19.7 seconds    5TSTS 13.0 seconds    6 Minute Walk Test     10 Meter Gait Speed Self-selected: 15.2s = 0.66 m/s; Fastest: 11.8s = 0.85 m/s    (Blank rows = not tested)  Coordination/Cerebellar Testing Finger to Nose: Dysmetria BUE but difficult to assess secondary to tremor; Heel to Shin: WNL Rapid alternating movements: WNL Finger Opposition:  WNL Pronator Drift: Negative  Functional Outcome Measures 07/12/23  05/30/23 06/07/23 07/12/23  BERG 49/56   52/56  DGI  17/24   FGA     TUG 19.7 seconds  13.51 seconds  5TSTS 13.0 seconds  11.30 seconds  6 Minute Walk Test     10 Meter Gait Speed Self-selected: 15.2s = 0.66 m/s; Fastest: 11.8s = 0.85 m/s  Self-Selected: 13.46= 0.74 m/s; Fastest: 11.13 = 0.90 m/s  (Blank rows = not tested)   TODAY'S TREATMENT: 07/27/23    SUBJECTIVE: Pt reports that he is doing well today. No changes since the last therapy session. Denies pertinent pain. Denies falls since last session. No specific questions or concerns.    PAIN: Denies   Ther-Act  NuStep L5 x 10 mins (5 mins unbilled) for BLE strengthening and warm-up during interval history;  Half kneeling to tall kneeling to standing transitions 4 x 10 ea LE in front  Forward marching with 5 # AW 2 bouts x 30 ft;  Backward marching with 5 # AW 2 bouts x 30 ft;  Sidestepping with 5 # AW 2 bouts x 30 ft;   Step ups to 6 step 2 x 10 ea LE;  Nautilus lunge chest press 2 x 10 ea LE in front;    Not Performed this session:  Obstacle course in hallway, step ups, balancing, backwards and forwards walking 4 x 50 ft  Cross-body marches with 5# ankle weights (AW) x 3 laps in // bars; Seated adductor ball squeezes 2 x 10 x 5 BLE; Airex feet apart eyes open/closed x 30s each; Airex feet apart eyes open with horizontal and vertical head turns x 30s each; Seated clams with manual resistance from therapist x 10 BLE; Seated alternating LAQ with 5# AW x 10 BLE; Airex balance beam side stepping x multiple laps; Airex balance beam tandem stepping x multiple laps; Airex feet together eyes open/closed x 30s each; Airex feet together eyes open with horizontal and vertical head turns x 30s each; All balance exercises performed in parallel bars and without UE support unless otherwise specified: Fishing game reaching outside of BOS with no UE support x1 ea hand;  Blazepod balance on AE pad, semicircle reaching outside BOS, 6 pods, random light-up, 1 min, Trial 1:  19 hits, Trial 2: 22 hits, Trial 3: 19 hits; STS with #6 chest press throw to rebounder 2 x 10;  Backwards walking outside with cone pick-ups off floor 4 x 50 ft;  Forwards walking with ball toss to SPT outside of BOS 4 x 50 ft;  Tandem stance with no UE support, 2 x 30 ea foot forward;  SLS with 2 finger UE support, 3 x 30 ea foot; MidRow Black TB 2 x 10 x 3 sec scap squeeze; Pallof Press Black TB  2 x 10 x 3 sec scap squeeze hold; Standing exercises with 5# AW: Hip flexion marches x 20 BLE; Hamstring curls x 20 BLE; Hip abduction x 20 BLE; Hip extension x 20 BLE;  PATIENT EDUCATION:  Education details: Pt educated throughout session about proper posture and technique with exercises. Improved exercise technique, movement at target joints, use of target muscles after min to mod verbal, visual, tactile cues.  Person educated: Patient Education method: Explanation, Demonstration, Verbal cues, and Handouts Education comprehension: verbalized understanding   HOME EXERCISE PROGRAM:  Access Code: [email or web address] URL: [email or web address] Date: 06/07/2023 Prepared by: Selinda Eck  Exercises - Sit to Stand Without Arm Support  - 1 x daily -  7 x weekly - 3 sets - 10 reps - Heel Raises with Counter Support  - 1 x daily - 7 x weekly - 3 sets - 10 reps - Standing Romberg to 1/2 Tandem Stance  - 1 x daily - 7 x weekly - 3 reps - 30s hold  ASSESSMENT:  CLINICAL IMPRESSION: Pt arrived 10 minutes late to session today so session abbreviated accordingly due to schedule constraints. Notable BUE tremor present during today's session. Pt was able to continue using 5# AW this session to progress strengthening exercises. Introduced floor half kneeling/tall kneeling/standing progression to assist patient in floor to standing transfers when completing his ADLs. Pt encouraged to follow-up as scheduled. Pt would benefit from continued physical therapy services at this time so he can increase his  strength, decrease his ambulation falls risk, and improve his balance so he can participate safely in his environment.    OBJECTIVE IMPAIRMENTS: Abnormal gait, decreased balance, difficulty walking, decreased strength, and dizziness.   ACTIVITY LIMITATIONS: standing, stairs, transfers, and caring for others  PARTICIPATION LIMITATIONS: meal prep, cleaning, laundry, shopping, and community activity  PERSONAL FACTORS: Age, Past/current experiences, Time since onset of injury/illness/exacerbation, and 3+ comorbidities: back pain, tremors, anxiety, depression are also affecting patient's functional outcome.   REHAB POTENTIAL: Good  CLINICAL DECISION MAKING: Unstable/unpredictable  EVALUATION COMPLEXITY: High   GOALS: Goals reviewed with patient? No  SHORT TERM GOALS: Target date: 07/11/2023  Pt will be independent with HEP in order to improve strength and balance in order to decrease fall risk and improve function at home. Baseline:  Goal status: INITIAL   LONG TERM GOALS: Target date: 08/22/2023   Pt will improve ABC by at least 13% in order to demonstrate clinically significant improvement in balance confidence.  Baseline: 62.2%, 07/12/23: 70% Goal status: IN PROGRESS  2.  Pt will improve BERG by at least 3 points in order to demonstrate clinically significant improvement in balance.   Baseline: 49/56; 07/12/23: 52/56 Goal status: MET  3.  Pt will decrease TUG to below 14 seconds/decrease in order to demonstrate decreased fall risk.       Baseline: 19.7s, 07/12/23: 13.51 sec Goal status: MET  4. Pt will increase self-selected 96m gait speed to at least 0.80 m/s in order to demonstrate clinically significant improvement in community ambulation.     Baseline: 0.66 m/s, 07/12/23: 0.74 m/s Goal status: IN PROGRESS  5. Pt will increase by at least 22m (141ft) in order to demonstrate clinically significant improvement in cardiopulmonary endurance and community ambulation       Baseline: 06/09/23: 916' Goal status: INITIAL  PLAN: PT FREQUENCY: 2x/week  PT DURATION: 8 weeks  PLANNED INTERVENTIONS: Therapeutic exercises, Therapeutic activity, Neuromuscular re-education, Balance training, Gait training, Patient/Family education, Self Care, Joint mobilization, Joint manipulation, Vestibular training, Canalith repositioning, Orthotic/Fit training, DME instructions, Dry Needling, Electrical stimulation, Spinal manipulation, Spinal mobilization, Cryotherapy, Moist heat, Taping, Traction, Ultrasound, Ionotophoresis 4mg /ml Dexamethasone , Manual therapy, and Re-evaluation.  PLAN FOR NEXT SESSION:  progress strength/balance exercises, review/modify HEP as necessary; blaze pod reaching, cone taps, physioball rows, forward and lateral step-ups, rebounder balance perturbations  Selinda JONETTA Eck PT, DPT, GCS  Vernell Moats, SPT  07/27/2023, 3:24 PM

## 2023-07-31 ENCOUNTER — Ambulatory Visit

## 2023-07-31 DIAGNOSIS — M6281 Muscle weakness (generalized): Secondary | ICD-10-CM

## 2023-07-31 DIAGNOSIS — R2681 Unsteadiness on feet: Secondary | ICD-10-CM | POA: Diagnosis not present

## 2023-07-31 NOTE — Therapy (Unsigned)
 OUTPATIENT PHYSICAL THERAPY BALANCE TREATMENT  Patient Name: Wesley Preston. MRN: [id number] DOB:[birth year], 83 y.o., male Today's Date: 08/03/2023  END OF SESSION:  PT End of Session - 08/02/23 1314     Visit Number 17    Number of Visits 25    Date for PT Re-Evaluation 08/22/23    Authorization Type eval: 05/30/23    Authorization - Visit Number 16    Authorization - Number of Visits 16    PT Start Time 1315    PT Stop Time 1400    PT Time Calculation (min) 45 min    Equipment Utilized During Treatment Gait belt    Activity Tolerance Patient tolerated treatment well    Behavior During Therapy WFL for tasks assessed/performed         Past Medical History:  Diagnosis Date   Anxiety    Back pain    BPH (benign prostatic hyperplasia)    Cancer (HCC)    BASAL CELL   Chest pain, non-cardiac    Colon polyps    Complication of anesthesia    anesthesia stays a long time   COPD (chronic obstructive pulmonary disease) (HCC)    Depression    Essential tremor    Essential tremor    Family history of adverse reaction to anesthesia    DAUGHTERS GET NAUSEATED   GERD (gastroesophageal reflux disease)    H/O   Helicobacter pylori (H. pylori) infection    Hyperlipidemia    Neuromuscular disorder (HCC)    TREMORS   Reactive airway disease    Reactive airway disease    Restless leg syndrome    Past Surgical History:  Procedure Laterality Date   COLONOSCOPY  2013   COLONOSCOPY WITH PROPOFOL  N/A 03/21/2016   Procedure: COLONOSCOPY WITH PROPOFOL ;  Surgeon: Lamar ONEIDA Holmes, MD;  Location: [facility] ENDOSCOPY;  Service: Endoscopy;  Laterality: N/A;   COLONOSCOPY WITH PROPOFOL  N/A 03/09/2018   Procedure: COLONOSCOPY WITH PROPOFOL ;  Surgeon: Holmes Lamar ONEIDA, MD;  Location: [facility] ENDOSCOPY;  Service: Endoscopy;  Laterality: N/A;   ESOPHAGOGASTRODUODENOSCOPY     ESOPHAGOGASTRODUODENOSCOPY N/A 03/09/2018   Procedure: ESOPHAGOGASTRODUODENOSCOPY (EGD);  Surgeon: Holmes Lamar ONEIDA, MD;   Location: [facility] ENDOSCOPY;  Service: Endoscopy;  Laterality: N/A;   HERNIA REPAIR     HOLEP-LASER ENUCLEATION OF THE PROSTATE WITH MORCELLATION N/A 05/10/2019   Procedure: HOLEP-LASER ENUCLEATION OF THE PROSTATE WITH MORCELLATION;  Surgeon: Francisca Redell BROCKS, MD;  Location: [facility] ORS;  Service: Urology;  Laterality: N/A;   INGUINAL HERNIA REPAIR Right 08/01/2014   Procedure: RIGHT INGUINAL HERNIA  REPAIR WITH MESH ;  Surgeon: Reyes LELON Cota, MD;  Location: [facility] ORS;  Service: General;  Laterality: Right; with large Ultra Pro mesh   NASAL SINUS SURGERY     ROTATOR CUFF REPAIR     TRIGGER FINGER RELEASE     Patient Active Problem List   Diagnosis Date Noted   Postprocedural stricture of urethra at fossa navicularis 07/09/2019   BPH with obstruction/lower urinary tract symptoms 08/21/2018   Restless leg syndrome 07/24/2018   Reactive airway disease 07/24/2018   Pure hypercholesterolemia 07/24/2018   Mechanical back pain 07/24/2018   History of Helicobacter pylori infection 07/24/2018   Essential tremor 07/24/2018   Depression 07/24/2018   BPH associated with nocturia 07/24/2018   Benign essential hypertension 07/24/2018   Anxiety 07/24/2018   Trigger finger of left thumb 04/14/2017   Hand pain, left 04/14/2017   Visual disturbance 07/18/2016   Tremor 07/18/2016   Imbalance  07/18/2016   Dizziness 07/18/2016   Right inguinal hernia 07/12/2014    PCP: Auston Reyes BIRCH, MD  REFERRING PROVIDER: Auston Reyes BIRCH, MD   REFERRING DIAG:  R26.89 (ICD-10-CM) - Other abnormalities of gait and mobility  R42 (ICD-10-CM) - Dizziness and giddiness   RATIONALE FOR EVALUATION AND TREATMENT: Rehabilitation  THERAPY DIAG: Unsteadiness on feet  Muscle weakness (generalized)  ONSET DATE: Multiple years  FOLLOW-UP APPT SCHEDULED WITH REFERRING PROVIDER: Yes, neurology;  FROM INITIAL EVALUATION SUBJECTIVE:                                                                                                                                                                                          SUBJECTIVE STATEMENT:  Unsteadiness  PERTINENT HISTORY:  Patient with worsening gait and balance. Feels he is dragging his feet. Infrequently using assistive device. Improved RLS since starting gabapentin. Intermittent episodes of vertigo with position changes.  He goes to [employer] for exercise 1-2x/wk. Ongoing tremors in bilateral hands, worse with activity or stress. He reports 4 falls with the most recent about 1 week ago. Pt reports that he was stepping backwards when he fell. He couldn't get up and had to call extended family to help him from the ground. His wife was not strong enough to assist. He denies any significant injury from the fall. He was referred to PT for sensory ataxia and it was recommended he use a walker as well as perform balance exercise.  08/02/2022 MR BRAIN W WO MR ORBITS W WO IMPRESSION:  1. Atrophic right optic nerve which may reflect sequela of prior  optic neuritis.  2. Otherwise unremarkable appearance of the orbits with no acute  finding.  3. No acute intracranial pathology.  4. Moderate background chronic small-vessel ischemic change.   12/30/2019 MR BRAIN WO IMPRESSION:  No evidence of recent infarction, hemorrhage, or mass. Mild progression of parenchymal volume loss and probable chronic microvascular ischemic changes since 2018. Small chronic left pontine infarcts.   06/10/16 CAROTID ARTERY ULTRASOUND  IMPRESSION: 1. Minimal bilateral carotid bifurcation atherosclerotic vascular plaque. No flow limiting stenosis. Degree of stenosis less than 50% bilaterally.  2. Vertebrals are patent with antegrade flow.  06/02/16  MRI BRAIN WO CONTRAST  IMPRESSION: Atrophy. Small vessel disease. Evidence for chronic brainstem lacunar infarction. No acute intracranial findings. Chronic pansinusitis. Despite prior surgery, significant fluid accumulation and mucosal  thickening can be seen, particularly in the RIGHT greater than LEFT frontoethmoid regions.    Pain: No Numbness/Tingling: No, but reports some feet burning; Focal Weakness: No Recent changes in overall health/medication: Yes, adjustment of meds Prior history of physical therapy for  balance:  No Dominant hand: right Imaging: Yes  Red flags: Positive for basal cell carcinoma, Negative for abdominal pain, chills/fever, night sweats, nausea, vomiting, unrelenting pain  PRECAUTIONS: Fall  WEIGHT BEARING RESTRICTIONS: No  FALLS: Has patient fallen in last 6 months? Yes. Number of falls 1, Directional pattern for falls: No  Living Environment Lives with: lives with their spouse Lives in: House/apartment Stairs: one level home, 2 steps to enter from the garage with L rail during ascend.  Has following equipment at home: grab bars in walk-in shower, no seat, rollator (uses periodically)   Prior level of function: Independent  Occupational demands: Retired Nutritional therapist: hunting (I can't get into the stand anymore), previously like to play golf but he can no longer bend over to put the ball on the tee;  Patient Goals: Pt would like to improve his balance   OBJECTIVE:   Patient Surveys  ABC: To be completed  Cognition Patient is oriented to person, place, and time.  Recent memory is intact.  Remote memory is intact.  Attention span and concentration are intact.  Expressive speech is intact.  Patient's fund of knowledge is within normal limits for educational level.    Gross Musculoskeletal Assessment Tremor: BUE tremors observed, worsened with stress; Bulk: Normal Tone: Normal  Posture: Mild forward head and rounded shoulders  AROM Deferred  LE MMT: MMT (out of 5) Right  Left   Hip flexion 4 4-  Hip extension    Hip abduction (seated) 5 5  Hip adduction (seated) 5 5  Hip internal rotation    Hip external rotation    Knee flexion 5 5  Knee extension 4+  4+  Ankle dorsiflexion 4+ 4+  Ankle plantarflexion Active Active  (* = pain; Blank rows = not tested)  Sensation Grossly intact to light touch throughout bilateral LEs as determined by testing dermatomes L2-S2. Proprioception, stereognosis, and hot/cold testing deferred on this date.  Reflexes R/L Knee Jerk (L3/4): 2+/2+  Ankle Jerk (S1/2): 2+/2+   Cranial Nerves Deferred  Coordination/Cerebellar Deferred  Transfers: Assistive device utilized: None  Sit to stand: Complete Independence Stand to sit: Complete Independence Chair to chair: Complete Independence Floor: Not assessed  Stairs: Level of Assistance: Complete Independence Stair Negotiation Technique: Alternating Pattern  with Bilateral Rails Number of Stairs: 4  Height of Stairs: 6  Comments: Decreased speed but good stability noted with single or bilateral UE support on rails  Gait: Gait pattern: decreased step length- Right, decreased step length- Left, poor foot clearance- Right, and poor foot clearance- Left Distance walked: 100' Assistive device utilized: None Level of assistance: Complete Independence Comments: Pt ambulates with decreased self-selected speed. Decreased step length bilaterally  BPPV TESTS:  Symptoms Duration Intensity Nystagmus  L Dix-Hallpike None   None  R Dix-Hallpike None   None  L Head Roll None   None  R Head Roll None   None  L Sidelying Test      R Sidelying Test      (blank = not tested)  mCTSIB: 5.4s  Functional Outcome Measures  05/30/23 06/07/23 Comments  BERG 49/56    DGI  17/24   FGA     TUG 19.7 seconds    5TSTS 13.0 seconds    6 Minute Walk Test     10 Meter Gait Speed Self-selected: 15.2s = 0.66 m/s; Fastest: 11.8s = 0.85 m/s    (Blank rows = not tested)  Coordination/Cerebellar Testing Finger to Nose: Dysmetria BUE but  difficult to assess secondary to tremor; Heel to Shin: WNL Rapid alternating movements: WNL Finger Opposition: WNL Pronator Drift:  Negative  Functional Outcome Measures 07/12/23  05/30/23 06/07/23 07/12/23  BERG 49/56  52/56  DGI  17/24   FGA     TUG 19.7 seconds  13.51 seconds  5TSTS 13.0 seconds  11.30 seconds  6 Minute Walk Test     10 Meter Gait Speed Self-selected: 15.2s = 0.66 m/s; Fastest: 11.8s = 0.85 m/s  Self-Selected: 13.46= 0.74 m/s; Fastest: 11.13 = 0.90 m/s  (Blank rows = not tested)   TODAY'S TREATMENT: 08/02/23   SUBJECTIVE: Pt reports that he is doing well today. No specific pain or soreness upon arrival. No reported falls since last session. No specific questions.   PAIN: Denies   Ther-Act  NuStep L5 x 10 mins (5 mins unbilled) for BLE strengthening and warm-up during interval history;  Standing exercises with 5# AW: Hip flexion marches x 10 BLE; Hamstring curls x 10 BLE; Hip abduction x 10 BLE; Hip extension x 10 BLE;  Seated clams with manual resistance from therapist x 10 BLE; Seated adductor squeezes with manual resistance from therapist x 10 BLE; Seated alternating LAQ with 5# AW x 10 BLE;  Functional Outcome Measures 07/12/23  05/30/23 06/07/23 07/12/23 08/02/23  BERG 49/56  52/56 53/56  DGI  17/24    FGA      TUG 19.7 seconds  13.51 seconds 12.0s  5TSTS 13.0 seconds  11.30 seconds 10.8s  6 Minute Walk Test      10 Meter Gait Speed Self-selected: 15.2s = 0.66 m/s; Fastest: 11.8s = 0.85 m/s  Self-Selected: 13.46= 0.74 m/s; Fastest: 11.13 = 0.90 m/s Self-Selected: 13.7s =  0.73 m/s; Fastest:  10.4s =  0.96 m/s  (Blank rows = not tested)  ABC: 45.6%   Not Performed this session:  Obstacle course in hallway, step ups, balancing, backwards and forwards walking 4 x 50 ft  Cross-body marches with 5# ankle weights (AW) x 3 laps in // bars; Airex feet apart eyes open/closed x 30s each; Airex feet apart eyes open with horizontal and vertical head turns x 30s each; Airex balance beam side stepping x multiple laps; Airex balance beam tandem stepping x multiple laps; Airex feet together  eyes open/closed x 30s each; Airex feet together eyes open with horizontal and vertical head turns x 30s each; All balance exercises performed in parallel bars and without UE support unless otherwise specified: Fishing game reaching outside of BOS with no UE support x1 ea hand;  Blazepod balance on AE pad, semicircle reaching outside BOS, 6 pods, random light-up, 1 min, Trial 1: 19 hits, Trial 2: 22 hits, Trial 3: 19 hits; Tandem stance with no UE support, 2 x 30 ea foot forward;  SLS with 2 finger UE support, 3 x 30 ea foot; MidRow Black TB 2 x 10 x 3 sec scap squeeze; Pallof Press Black TB  2 x 10 x 3 sec scap squeeze hold; Step ups to 6 step 2 x 10 ea LE;  Nautilus lunge chest press 2 x 10 ea LE in front;    PATIENT EDUCATION:  Education details: Pt educated throughout session about proper posture and technique with exercises. Improved exercise technique, movement at target joints, use of target muscles after min to mod verbal, visual, tactile cues. Outcome measures and goals Person educated: Patient Education method: Explanation, Demonstration, Verbal cues, and Handouts Education comprehension: verbalized understanding   HOME EXERCISE PROGRAM:  Access Code:  [email or web address] URL: [email or web address] Date: 06/07/2023 Prepared by: Selinda Eck  Exercises - Sit to Stand Without Arm Support  - 1 x daily - 7 x weekly - 3 sets - 10 reps - Heel Raises with Counter Support  - 1 x daily - 7 x weekly - 3 sets - 10 reps - Standing Romberg to 1/2 Tandem Stance  - 1 x daily - 7 x weekly - 3 reps - 30s hold  ASSESSMENT:  CLINICAL IMPRESSION: Updated goals and outcome measures this session with patient. Pt met his goals of improving his BERG and TUG. BERG score improved from 49/56 to 53/56. TUG score improved from 19.7 seconds to 12.0 seconds. Both of these improvements indicate that the pt is at a decreased risk of falls compared to his initial evaluation on 05/30/23. He made good  progress towards his goal of improving his ABC score initially but for some reason it has regressed again today. Questions and answers were reviewed to confirm correct interpretation. His self-selected pace improved initially but has remained relatively consistent since that time. This speed is still below necessary relevant speed for community mobility. is deferred today secondary to time constraints. Plan for future sessions is to perform more ambulation activities that include dynamic or multi-tasking activities. Pt encouraged to follow-up as scheduled. He would benefit from continued physical therapy services at this time so he can increase his strength, decrease his ambulation falls risk, and improve his balance so he can participate safely in his environment.   OBJECTIVE IMPAIRMENTS: Abnormal gait, decreased balance, difficulty walking, decreased strength, and dizziness.   ACTIVITY LIMITATIONS: standing, stairs, transfers, and caring for others  PARTICIPATION LIMITATIONS: meal prep, cleaning, laundry, shopping, and community activity  PERSONAL FACTORS: Age, Past/current experiences, Time since onset of injury/illness/exacerbation, and 3+ comorbidities: back pain, tremors, anxiety, depression are also affecting patient's functional outcome.   REHAB POTENTIAL: Good  CLINICAL DECISION MAKING: Unstable/unpredictable  EVALUATION COMPLEXITY: High   GOALS: Goals reviewed with patient? No  SHORT TERM GOALS: Target date: 07/11/2023  Pt will be independent with HEP in order to improve strength and balance in order to decrease fall risk and improve function at home. Baseline:  Goal status: ONGOING   LONG TERM GOALS: Target date: 08/22/2023   Pt will improve ABC by at least 13% in order to demonstrate clinically significant improvement in balance confidence.  Baseline: 62.2%, 07/12/23: 70%; 08/02/23: 45.6% Goal status: IN PROGRESS  2.  Pt will improve BERG by at least 3 points in  order to demonstrate clinically significant improvement in balance.   Baseline: 49/56; 07/12/23: 52/56; 08/02/23: 53/56; Goal status: MET  3.  Pt will decrease TUG to below 14 seconds/decrease in order to demonstrate decreased fall risk.       Baseline: 19.7s, 07/12/23: 13.51 sec; 08/02/23: 12.0s Goal status: MET  4. Pt will increase self-selected 35m gait speed to at least 0.80 m/s in order to demonstrate clinically significant improvement in community ambulation.     Baseline: 0.66 m/s, 07/12/23: 0.74 m/s; 08/02/23: 0.73 m/s Goal status: PARTIALLY MET  5. Pt will increase by at least 1m (140ft) in order to demonstrate clinically significant improvement in cardiopulmonary endurance and community ambulation      Baseline: 06/09/23: 916'; 08/02/23: Deferred Goal status: ONGOING  PLAN: PT FREQUENCY: 2x/week  PT DURATION: 8 weeks  PLANNED INTERVENTIONS: Therapeutic exercises, Therapeutic activity, Neuromuscular re-education, Balance training, Gait training, Patient/Family education, Self Care, Joint mobilization, Joint manipulation, Vestibular training, Canalith repositioning, Orthotic/Fit training,  DME instructions, Dry Needling, Electrical stimulation, Spinal manipulation, Spinal mobilization, Cryotherapy, Moist heat, Taping, Traction, Ultrasound, Ionotophoresis 4mg /ml Dexamethasone , Manual therapy, and Re-evaluation.  PLAN FOR NEXT SESSION:  DGI, , progress strength/balance exercises, review/modify HEP as necessary;   Selinda BIRCH Keziyah Kneale PT, DPT, GCS  08/03/2023, 2:33 PM

## 2023-07-31 NOTE — Therapy (Signed)
 OUTPATIENT PHYSICAL THERAPY BALANCE TREATMENT  Patient Name: Wesley Preston. MRN: [id number] DOB:10-28-1940, 83 y.o., male Today's Date: 07/31/2023  END OF SESSION:  PT End of Session - 07/31/23 1017     Visit Number 16    Number of Visits 25    Date for PT Re-Evaluation 08/22/23    Authorization Type eval: 05/30/23    Authorization - Visit Number 15    Authorization - Number of Visits 16    PT Start Time 1018    PT Stop Time 1101    PT Time Calculation (min) 43 min    Equipment Utilized During Treatment Gait belt    Activity Tolerance Patient tolerated treatment well    Behavior During Therapy WFL for tasks assessed/performed         Past Medical History:  Diagnosis Date   Anxiety    Back pain    BPH (benign prostatic hyperplasia)    Cancer (HCC)    BASAL CELL   Chest pain, non-cardiac    Colon polyps    Complication of anesthesia    anesthesia stays a long time   COPD (chronic obstructive pulmonary disease) (HCC)    Depression    Essential tremor    Essential tremor    Family history of adverse reaction to anesthesia    DAUGHTERS GET NAUSEATED   GERD (gastroesophageal reflux disease)    H/O   Helicobacter pylori (H. pylori) infection    Hyperlipidemia    Neuromuscular disorder (HCC)    TREMORS   Reactive airway disease    Reactive airway disease    Restless leg syndrome    Past Surgical History:  Procedure Laterality Date   COLONOSCOPY  2013   COLONOSCOPY WITH PROPOFOL  N/A 03/21/2016   Procedure: COLONOSCOPY WITH PROPOFOL ;  Surgeon: Lamar ONEIDA Holmes, MD;  Location: [facility] ENDOSCOPY;  Service: Endoscopy;  Laterality: N/A;   COLONOSCOPY WITH PROPOFOL  N/A 03/09/2018   Procedure: COLONOSCOPY WITH PROPOFOL ;  Surgeon: Holmes Lamar ONEIDA, MD;  Location: [facility] ENDOSCOPY;  Service: Endoscopy;  Laterality: N/A;   ESOPHAGOGASTRODUODENOSCOPY     ESOPHAGOGASTRODUODENOSCOPY N/A 03/09/2018   Procedure: ESOPHAGOGASTRODUODENOSCOPY (EGD);  Surgeon: Holmes Lamar ONEIDA, MD;   Location: [facility] ENDOSCOPY;  Service: Endoscopy;  Laterality: N/A;   HERNIA REPAIR     HOLEP-LASER ENUCLEATION OF THE PROSTATE WITH MORCELLATION N/A 05/10/2019   Procedure: HOLEP-LASER ENUCLEATION OF THE PROSTATE WITH MORCELLATION;  Surgeon: Francisca Redell BROCKS, MD;  Location: [facility] ORS;  Service: Urology;  Laterality: N/A;   INGUINAL HERNIA REPAIR Right 08/01/2014   Procedure: RIGHT INGUINAL HERNIA  REPAIR WITH MESH ;  Surgeon: Reyes LELON Cota, MD;  Location: [facility] ORS;  Service: General;  Laterality: Right; with large Ultra Pro mesh   NASAL SINUS SURGERY     ROTATOR CUFF REPAIR     TRIGGER FINGER RELEASE     Patient Active Problem List   Diagnosis Date Noted   Postprocedural stricture of urethra at fossa navicularis 07/09/2019   BPH with obstruction/lower urinary tract symptoms 08/21/2018   Restless leg syndrome 07/24/2018   Reactive airway disease 07/24/2018   Pure hypercholesterolemia 07/24/2018   Mechanical back pain 07/24/2018   History of Helicobacter pylori infection 07/24/2018   Essential tremor 07/24/2018   Depression 07/24/2018   BPH associated with nocturia 07/24/2018   Benign essential hypertension 07/24/2018   Anxiety 07/24/2018   Trigger finger of left thumb 04/14/2017   Hand pain, left 04/14/2017   Visual disturbance 07/18/2016   Tremor 07/18/2016   Imbalance  07/18/2016   Dizziness 07/18/2016   Right inguinal hernia 07/12/2014    PCP: Auston Reyes BIRCH, MD  REFERRING PROVIDER: Auston Reyes BIRCH, MD   REFERRING DIAG:  R26.89 (ICD-10-CM) - Other abnormalities of gait and mobility  R42 (ICD-10-CM) - Dizziness and giddiness   RATIONALE FOR EVALUATION AND TREATMENT: Rehabilitation  THERAPY DIAG: Unsteadiness on feet  Muscle weakness (generalized)  ONSET DATE: Multiple years  FOLLOW-UP APPT SCHEDULED WITH REFERRING PROVIDER: Yes, neurology;  FROM INITIAL EVALUATION SUBJECTIVE:                                                                                                                                                                                          SUBJECTIVE STATEMENT:  Unsteadiness  PERTINENT HISTORY:  Patient with worsening gait and balance. Feels he is dragging his feet. Infrequently using assistive device. Improved RLS since starting gabapentin. Intermittent episodes of vertigo with position changes.  He goes to [employer] for exercise 1-2x/wk. Ongoing tremors in bilateral hands, worse with activity or stress. He reports 4 falls with the most recent about 1 week ago. Pt reports that he was stepping backwards when he fell. He couldn't get up and had to call extended family to help him from the ground. His wife was not strong enough to assist. He denies any significant injury from the fall. He was referred to PT for sensory ataxia and it was recommended he use a walker as well as perform balance exercise.  08/02/2022 MR BRAIN W WO MR ORBITS W WO IMPRESSION:  1. Atrophic right optic nerve which may reflect sequela of prior  optic neuritis.  2. Otherwise unremarkable appearance of the orbits with no acute  finding.  3. No acute intracranial pathology.  4. Moderate background chronic small-vessel ischemic change.   12/30/2019 MR BRAIN WO IMPRESSION:  No evidence of recent infarction, hemorrhage, or mass. Mild progression of parenchymal volume loss and probable chronic microvascular ischemic changes since 2018. Small chronic left pontine infarcts.   06/10/16 CAROTID ARTERY ULTRASOUND  IMPRESSION: 1. Minimal bilateral carotid bifurcation atherosclerotic vascular plaque. No flow limiting stenosis. Degree of stenosis less than 50% bilaterally.  2. Vertebrals are patent with antegrade flow.  06/02/16  MRI BRAIN WO CONTRAST  IMPRESSION: Atrophy. Small vessel disease. Evidence for chronic brainstem lacunar infarction. No acute intracranial findings. Chronic pansinusitis. Despite prior surgery, significant fluid accumulation and mucosal  thickening can be seen, particularly in the RIGHT greater than LEFT frontoethmoid regions.    Pain: No Numbness/Tingling: No, but reports some feet burning; Focal Weakness: No Recent changes in overall health/medication: Yes, adjustment of meds Prior history of physical therapy for  balance:  No Dominant hand: right Imaging: Yes  Red flags: Positive for basal cell carcinoma, Negative for abdominal pain, chills/fever, night sweats, nausea, vomiting, unrelenting pain  PRECAUTIONS: Fall  WEIGHT BEARING RESTRICTIONS: No  FALLS: Has patient fallen in last 6 months? Yes. Number of falls 1, Directional pattern for falls: No  Living Environment Lives with: lives with their spouse Lives in: House/apartment Stairs: one level home, 2 steps to enter from the garage with L rail during ascend.  Has following equipment at home: grab bars in walk-in shower, no seat, rollator (uses periodically)   Prior level of function: Independent  Occupational demands: Retired Nutritional therapist: hunting (I can't get into the stand anymore), previously like to play golf but he can no longer bend over to put the ball on the tee;  Patient Goals: Pt would like to improve his balance   OBJECTIVE:   Patient Surveys  ABC: To be completed  Cognition Patient is oriented to person, place, and time.  Recent memory is intact.  Remote memory is intact.  Attention span and concentration are intact.  Expressive speech is intact.  Patient's fund of knowledge is within normal limits for educational level.    Gross Musculoskeletal Assessment Tremor: BUE tremors observed, worsened with stress; Bulk: Normal Tone: Normal  Posture: Mild forward head and rounded shoulders  AROM Deferred  LE MMT: MMT (out of 5) Right  Left   Hip flexion 4 4-  Hip extension    Hip abduction (seated) 5 5  Hip adduction (seated) 5 5  Hip internal rotation    Hip external rotation    Knee flexion 5 5  Knee extension 4+  4+  Ankle dorsiflexion 4+ 4+  Ankle plantarflexion Active Active  (* = pain; Blank rows = not tested)  Sensation Grossly intact to light touch throughout bilateral LEs as determined by testing dermatomes L2-S2. Proprioception, stereognosis, and hot/cold testing deferred on this date.  Reflexes R/L Knee Jerk (L3/4): 2+/2+  Ankle Jerk (S1/2): 2+/2+   Cranial Nerves Deferred  Coordination/Cerebellar Deferred  Transfers: Assistive device utilized: None  Sit to stand: Complete Independence Stand to sit: Complete Independence Chair to chair: Complete Independence Floor: Not assessed  Stairs: Level of Assistance: Complete Independence Stair Negotiation Technique: Alternating Pattern  with Bilateral Rails Number of Stairs: 4  Height of Stairs: 6  Comments: Decreased speed but good stability noted with single or bilateral UE support on rails  Gait: Gait pattern: decreased step length- Right, decreased step length- Left, poor foot clearance- Right, and poor foot clearance- Left Distance walked: 100' Assistive device utilized: None Level of assistance: Complete Independence Comments: Pt ambulates with decreased self-selected speed. Decreased step length bilaterally  BPPV TESTS:  Symptoms Duration Intensity Nystagmus  L Dix-Hallpike None   None  R Dix-Hallpike None   None  L Head Roll None   None  R Head Roll None   None  L Sidelying Test      R Sidelying Test      (blank = not tested)  mCTSIB: 5.4s  Functional Outcome Measures  05/30/23 06/07/23 Comments  BERG 49/56    DGI  17/24   FGA     TUG 19.7 seconds    5TSTS 13.0 seconds    6 Minute Walk Test     10 Meter Gait Speed Self-selected: 15.2s = 0.66 m/s; Fastest: 11.8s = 0.85 m/s    (Blank rows = not tested)  Coordination/Cerebellar Testing Finger to Nose: Dysmetria BUE but  difficult to assess secondary to tremor; Heel to Shin: WNL Rapid alternating movements: WNL Finger Opposition: WNL Pronator Drift:  Negative  Functional Outcome Measures 07/12/23  05/30/23 06/07/23 07/12/23  BERG 49/56  52/56  DGI  17/24   FGA     TUG 19.7 seconds  13.51 seconds  5TSTS 13.0 seconds  11.30 seconds  6 Minute Walk Test     10 Meter Gait Speed Self-selected: 15.2s = 0.66 m/s; Fastest: 11.8s = 0.85 m/s  Self-Selected: 13.46= 0.74 m/s; Fastest: 11.13 = 0.90 m/s  (Blank rows = not tested)   TODAY'S TREATMENT: 07/31/23   SUBJECTIVE: Pt reports that he is doing well today. He reports some knee soreness following the floor to stand transfers last time which has improved slightly but persists. No reported falls since last session. No specific questions.   PAIN: Denies   Ther-Act  NuStep L5 x 10 mins (5 mins unbilled) for BLE strengthening and warm-up during interval history;  Forward marching with 5 # AW in // bars x multiple bouts; Backward marching with 5 # AW in // bars x multiple bouts; Sidestepping with 5 # AW in // bars x multiple bouts;  Standing exercises with 5# AW: Hip flexion marches 2 x 10 BLE; Hamstring curls 2 x 10 BLE; Hip abduction 2 x 10 BLE; Hip extension 2 x 10 BLE;  Seated clams with manual resistance from therapist 2 x 10 BLE; Seated adductor squeezes with manual resistance from therapist 2 x 10 BLE; Seated alternating LAQ with 5# AW 2 x 10 BLE;  Standing heel raises 2 x 10; STS with Airex pad on seat and 6# chest press throw to rebounder 2 x 10;    Not Performed this session:  Obstacle course in hallway, step ups, balancing, backwards and forwards walking 4 x 50 ft  Cross-body marches with 5# ankle weights (AW) x 3 laps in // bars; Airex feet apart eyes open/closed x 30s each; Airex feet apart eyes open with horizontal and vertical head turns x 30s each; Airex balance beam side stepping x multiple laps; Airex balance beam tandem stepping x multiple laps; Airex feet together eyes open/closed x 30s each; Airex feet together eyes open with horizontal and vertical head turns x  30s each; All balance exercises performed in parallel bars and without UE support unless otherwise specified: Fishing game reaching outside of BOS with no UE support x1 ea hand;  Blazepod balance on AE pad, semicircle reaching outside BOS, 6 pods, random light-up, 1 min, Trial 1: 19 hits, Trial 2: 22 hits, Trial 3: 19 hits; Tandem stance with no UE support, 2 x 30 ea foot forward;  SLS with 2 finger UE support, 3 x 30 ea foot; MidRow Black TB 2 x 10 x 3 sec scap squeeze; Pallof Press Black TB  2 x 10 x 3 sec scap squeeze hold; Step ups to 6 step 2 x 10 ea LE;  Nautilus lunge chest press 2 x 10 ea LE in front;    PATIENT EDUCATION:  Education details: Pt educated throughout session about proper posture and technique with exercises. Improved exercise technique, movement at target joints, use of target muscles after min to mod verbal, visual, tactile cues.  Person educated: Patient Education method: Explanation, Demonstration, Verbal cues, and Handouts Education comprehension: verbalized understanding   HOME EXERCISE PROGRAM:  Access Code: [email or web address] URL: [email or web address] Date: 06/07/2023 Prepared by: Selinda Eck  Exercises - Sit to Stand Without Arm Support  - 1 x daily -  7 x weekly - 3 sets - 10 reps - Heel Raises with Counter Support  - 1 x daily - 7 x weekly - 3 sets - 10 reps - Standing Romberg to 1/2 Tandem Stance  - 1 x daily - 7 x weekly - 3 reps - 30s hold  ASSESSMENT:  CLINICAL IMPRESSION: Pt was able to continue using 5# AW this session to progress strengthening exercises. He reports some knee soreness after last session so deferred any deep knee flexion exercises. Plan to update his outcome measures and goals at next session to assess appropriateness for reverification vs discharge. Pt encouraged to follow-up as scheduled. He would benefit from continued physical therapy services at this time so he can increase his strength, decrease his ambulation  falls risk, and improve his balance so he can participate safely in his environment.    OBJECTIVE IMPAIRMENTS: Abnormal gait, decreased balance, difficulty walking, decreased strength, and dizziness.   ACTIVITY LIMITATIONS: standing, stairs, transfers, and caring for others  PARTICIPATION LIMITATIONS: meal prep, cleaning, laundry, shopping, and community activity  PERSONAL FACTORS: Age, Past/current experiences, Time since onset of injury/illness/exacerbation, and 3+ comorbidities: back pain, tremors, anxiety, depression are also affecting patient's functional outcome.   REHAB POTENTIAL: Good  CLINICAL DECISION MAKING: Unstable/unpredictable  EVALUATION COMPLEXITY: High   GOALS: Goals reviewed with patient? No  SHORT TERM GOALS: Target date: 07/11/2023  Pt will be independent with HEP in order to improve strength and balance in order to decrease fall risk and improve function at home. Baseline:  Goal status: INITIAL   LONG TERM GOALS: Target date: 08/22/2023   Pt will improve ABC by at least 13% in order to demonstrate clinically significant improvement in balance confidence.  Baseline: 62.2%, 07/12/23: 70% Goal status: IN PROGRESS  2.  Pt will improve BERG by at least 3 points in order to demonstrate clinically significant improvement in balance.   Baseline: 49/56; 07/12/23: 52/56 Goal status: MET  3.  Pt will decrease TUG to below 14 seconds/decrease in order to demonstrate decreased fall risk.       Baseline: 19.7s, 07/12/23: 13.51 sec Goal status: MET  4. Pt will increase self-selected 68m gait speed to at least 0.80 m/s in order to demonstrate clinically significant improvement in community ambulation.     Baseline: 0.66 m/s, 07/12/23: 0.74 m/s Goal status: IN PROGRESS  5. Pt will increase by at least 64m (110ft) in order to demonstrate clinically significant improvement in cardiopulmonary endurance and community ambulation      Baseline: 06/09/23: 916' Goal status:  INITIAL  PLAN: PT FREQUENCY: 2x/week  PT DURATION: 8 weeks  PLANNED INTERVENTIONS: Therapeutic exercises, Therapeutic activity, Neuromuscular re-education, Balance training, Gait training, Patient/Family education, Self Care, Joint mobilization, Joint manipulation, Vestibular training, Canalith repositioning, Orthotic/Fit training, DME instructions, Dry Needling, Electrical stimulation, Spinal manipulation, Spinal mobilization, Cryotherapy, Moist heat, Taping, Traction, Ultrasound, Ionotophoresis 4mg /ml Dexamethasone , Manual therapy, and Re-evaluation.  PLAN FOR NEXT SESSION:  update outcome/goals, submit for additional insurance authorization as necessary, progress strength/balance exercises, review/modify HEP as necessary;   Selinda BIRCH Dartha Rozzell PT, DPT, GCS  07/31/2023, 11:20 AM

## 2023-08-01 ENCOUNTER — Encounter

## 2023-08-02 ENCOUNTER — Ambulatory Visit

## 2023-08-02 DIAGNOSIS — R2681 Unsteadiness on feet: Secondary | ICD-10-CM | POA: Diagnosis not present

## 2023-08-02 DIAGNOSIS — M6281 Muscle weakness (generalized): Secondary | ICD-10-CM

## 2023-08-03 ENCOUNTER — Encounter

## 2023-08-16 NOTE — Therapy (Incomplete)
 OUTPATIENT PHYSICAL THERAPY BALANCE TREATMENT  Patient Name: Wesley Preston. MRN: [id number] DOB:[birth year], 83 y.o., male Today's Date: 08/16/2023  END OF SESSION:   Past Medical History:  Diagnosis Date   Anxiety    Back pain    BPH (benign prostatic hyperplasia)    Cancer (HCC)    BASAL CELL   Chest pain, non-cardiac    Colon polyps    Complication of anesthesia    anesthesia stays a long time   COPD (chronic obstructive pulmonary disease) (HCC)    Depression    Essential tremor    Essential tremor    Family history of adverse reaction to anesthesia    DAUGHTERS GET NAUSEATED   GERD (gastroesophageal reflux disease)    H/O   Helicobacter pylori (H. pylori) infection    Hyperlipidemia    Neuromuscular disorder (HCC)    TREMORS   Reactive airway disease    Reactive airway disease    Restless leg syndrome    Past Surgical History:  Procedure Laterality Date   COLONOSCOPY  2013   COLONOSCOPY WITH PROPOFOL  N/A 03/21/2016   Procedure: COLONOSCOPY WITH PROPOFOL ;  Surgeon: Lamar ONEIDA Holmes, MD;  Location: [facility] ENDOSCOPY;  Service: Endoscopy;  Laterality: N/A;   COLONOSCOPY WITH PROPOFOL  N/A 03/09/2018   Procedure: COLONOSCOPY WITH PROPOFOL ;  Surgeon: Holmes Lamar ONEIDA, MD;  Location: [facility] ENDOSCOPY;  Service: Endoscopy;  Laterality: N/A;   ESOPHAGOGASTRODUODENOSCOPY     ESOPHAGOGASTRODUODENOSCOPY N/A 03/09/2018   Procedure: ESOPHAGOGASTRODUODENOSCOPY (EGD);  Surgeon: Holmes Lamar ONEIDA, MD;  Location: [facility] ENDOSCOPY;  Service: Endoscopy;  Laterality: N/A;   HERNIA REPAIR     HOLEP-LASER ENUCLEATION OF THE PROSTATE WITH MORCELLATION N/A 05/10/2019   Procedure: HOLEP-LASER ENUCLEATION OF THE PROSTATE WITH MORCELLATION;  Surgeon: Francisca Redell BROCKS, MD;  Location: [facility] ORS;  Service: Urology;  Laterality: N/A;   INGUINAL HERNIA REPAIR Right 08/01/2014   Procedure: RIGHT INGUINAL HERNIA  REPAIR WITH MESH ;  Surgeon: Reyes LELON Cota, MD;  Location: [facility] ORS;  Service: General;   Laterality: Right; with large Ultra Pro mesh   NASAL SINUS SURGERY     ROTATOR CUFF REPAIR     TRIGGER FINGER RELEASE     Patient Active Problem List   Diagnosis Date Noted   Postprocedural stricture of urethra at fossa navicularis 07/09/2019   BPH with obstruction/lower urinary tract symptoms 08/21/2018   Restless leg syndrome 07/24/2018   Reactive airway disease 07/24/2018   Pure hypercholesterolemia 07/24/2018   Mechanical back pain 07/24/2018   History of Helicobacter pylori infection 07/24/2018   Essential tremor 07/24/2018   Depression 07/24/2018   BPH associated with nocturia 07/24/2018   Benign essential hypertension 07/24/2018   Anxiety 07/24/2018   Trigger finger of left thumb 04/14/2017   Hand pain, left 04/14/2017   Visual disturbance 07/18/2016   Tremor 07/18/2016   Imbalance 07/18/2016   Dizziness 07/18/2016   Right inguinal hernia 07/12/2014    PCP: Auston Reyes BIRCH, MD  REFERRING PROVIDER: Auston Reyes BIRCH, MD   REFERRING DIAG:  R26.89 (ICD-10-CM) - Other abnormalities of gait and mobility  R42 (ICD-10-CM) - Dizziness and giddiness   RATIONALE FOR EVALUATION AND TREATMENT: Rehabilitation  THERAPY DIAG: Unsteadiness on feet  Muscle weakness (generalized)  ONSET DATE: Multiple years  FOLLOW-UP APPT SCHEDULED WITH REFERRING PROVIDER: Yes, neurology;  FROM INITIAL EVALUATION SUBJECTIVE:  SUBJECTIVE STATEMENT:  Unsteadiness  PERTINENT HISTORY:  Patient with worsening gait and balance. Feels he is dragging his feet. Infrequently using assistive device. Improved RLS since starting gabapentin. Intermittent episodes of vertigo with position changes.  He goes to [employer] for exercise 1-2x/wk. Ongoing tremors in bilateral hands, worse with activity or stress. He reports  4 falls with the most recent about 1 week ago. Pt reports that he was stepping backwards when he fell. He couldn't get up and had to call extended family to help him from the ground. His wife was not strong enough to assist. He denies any significant injury from the fall. He was referred to PT for sensory ataxia and it was recommended he use a walker as well as perform balance exercise.  08/02/2022 MR BRAIN W WO MR ORBITS W WO IMPRESSION:  1. Atrophic right optic nerve which may reflect sequela of prior  optic neuritis.  2. Otherwise unremarkable appearance of the orbits with no acute  finding.  3. No acute intracranial pathology.  4. Moderate background chronic small-vessel ischemic change.   12/30/2019 MR BRAIN WO IMPRESSION:  No evidence of recent infarction, hemorrhage, or mass. Mild progression of parenchymal volume loss and probable chronic microvascular ischemic changes since 2018. Small chronic left pontine infarcts.   06/10/16 CAROTID ARTERY ULTRASOUND  IMPRESSION: 1. Minimal bilateral carotid bifurcation atherosclerotic vascular plaque. No flow limiting stenosis. Degree of stenosis less than 50% bilaterally.  2. Vertebrals are patent with antegrade flow.  06/02/16  MRI BRAIN WO CONTRAST  IMPRESSION: Atrophy. Small vessel disease. Evidence for chronic brainstem lacunar infarction. No acute intracranial findings. Chronic pansinusitis. Despite prior surgery, significant fluid accumulation and mucosal thickening can be seen, particularly in the RIGHT greater than LEFT frontoethmoid regions.    Pain: No Numbness/Tingling: No, but reports some feet burning; Focal Weakness: No Recent changes in overall health/medication: Yes, adjustment of meds Prior history of physical therapy for balance:  No Dominant hand: right Imaging: Yes  Red flags: Positive for basal cell carcinoma, Negative for abdominal pain, chills/fever, night sweats, nausea, vomiting, unrelenting  pain  PRECAUTIONS: Fall  WEIGHT BEARING RESTRICTIONS: No  FALLS: Has patient fallen in last 6 months? Yes. Number of falls 1, Directional pattern for falls: No  Living Environment Lives with: lives with their spouse Lives in: House/apartment Stairs: one level home, 2 steps to enter from the garage with L rail during ascend.  Has following equipment at home: grab bars in walk-in shower, no seat, rollator (uses periodically)   Prior level of function: Independent  Occupational demands: Retired Nutritional therapist: hunting (I can't get into the stand anymore), previously like to play golf but he can no longer bend over to put the ball on the tee;  Patient Goals: Pt would like to improve his balance   OBJECTIVE:   Patient Surveys  ABC: To be completed  Cognition Patient is oriented to person, place, and time.  Recent memory is intact.  Remote memory is intact.  Attention span and concentration are intact.  Expressive speech is intact.  Patient's fund of knowledge is within normal limits for educational level.    Gross Musculoskeletal Assessment Tremor: BUE tremors observed, worsened with stress; Bulk: Normal Tone: Normal  Posture: Mild forward head and rounded shoulders  AROM Deferred  LE MMT: MMT (out of 5) Right  Left   Hip flexion 4 4-  Hip extension    Hip abduction (seated) 5 5  Hip adduction (seated) 5 5  Hip  internal rotation    Hip external rotation    Knee flexion 5 5  Knee extension 4+ 4+  Ankle dorsiflexion 4+ 4+  Ankle plantarflexion Active Active  (* = pain; Blank rows = not tested)  Sensation Grossly intact to light touch throughout bilateral LEs as determined by testing dermatomes L2-S2. Proprioception, stereognosis, and hot/cold testing deferred on this date.  Reflexes R/L Knee Jerk (L3/4): 2+/2+  Ankle Jerk (S1/2): 2+/2+   Cranial Nerves Deferred  Coordination/Cerebellar Deferred  Transfers: Assistive device utilized: None   Sit to stand: Complete Independence Stand to sit: Complete Independence Chair to chair: Complete Independence Floor: Not assessed  Stairs: Level of Assistance: Complete Independence Stair Negotiation Technique: Alternating Pattern  with Bilateral Rails Number of Stairs: 4  Height of Stairs: 6  Comments: Decreased speed but good stability noted with single or bilateral UE support on rails  Gait: Gait pattern: decreased step length- Right, decreased step length- Left, poor foot clearance- Right, and poor foot clearance- Left Distance walked: 100' Assistive device utilized: None Level of assistance: Complete Independence Comments: Pt ambulates with decreased self-selected speed. Decreased step length bilaterally  BPPV TESTS:  Symptoms Duration Intensity Nystagmus  L Dix-Hallpike None   None  R Dix-Hallpike None   None  L Head Roll None   None  R Head Roll None   None  L Sidelying Test      R Sidelying Test      (blank = not tested)  mCTSIB: 5.4s  Functional Outcome Measures  05/30/23 06/07/23 Comments  BERG 49/56    DGI  17/24   FGA     TUG 19.7 seconds    5TSTS 13.0 seconds    6 Minute Walk Test     10 Meter Gait Speed Self-selected: 15.2s = 0.66 m/s; Fastest: 11.8s = 0.85 m/s    (Blank rows = not tested)  Coordination/Cerebellar Testing Finger to Nose: Dysmetria BUE but difficult to assess secondary to tremor; Heel to Shin: WNL Rapid alternating movements: WNL Finger Opposition: WNL Pronator Drift: Negative  Functional Outcome Measures 07/12/23  05/30/23 06/07/23 07/12/23  BERG 49/56  52/56  DGI  17/24   FGA     TUG 19.7 seconds  13.51 seconds  5TSTS 13.0 seconds  11.30 seconds  6 Minute Walk Test     10 Meter Gait Speed Self-selected: 15.2s = 0.66 m/s; Fastest: 11.8s = 0.85 m/s  Self-Selected: 13.46= 0.74 m/s; Fastest: 11.13 = 0.90 m/s  (Blank rows = not tested)   TODAY'S TREATMENT: 08/02/23   SUBJECTIVE: Pt reports that he is doing well today. No specific  pain or soreness upon arrival. No reported falls since last session. No specific questions.   PAIN: Denies   Ther-Act  NuStep L5 x 10 mins (5 mins unbilled) for BLE strengthening and warm-up during interval history;  DGI, , progress strength/balance exercises, review/modify HEP as necessary;   Standing exercises with 5# AW: Hip flexion marches x 10 BLE; Hamstring curls x 10 BLE; Hip abduction x 10 BLE; Hip extension x 10 BLE;  Seated clams with manual resistance from therapist x 10 BLE; Seated adductor squeezes with manual resistance from therapist x 10 BLE; Seated alternating LAQ with 5# AW x 10 BLE;  Functional Outcome Measures 07/12/23  05/30/23 06/07/23 07/12/23 08/02/23  BERG 49/56  52/56 53/56  DGI  17/24    FGA      TUG 19.7 seconds  13.51 seconds 12.0s  5TSTS 13.0 seconds  11.30 seconds 10.8s  6 Minute Walk Test      10 Meter Gait Speed Self-selected: 15.2s = 0.66 m/s; Fastest: 11.8s = 0.85 m/s  Self-Selected: 13.46= 0.74 m/s; Fastest: 11.13 = 0.90 m/s Self-Selected: 13.7s =  0.73 m/s; Fastest:  10.4s =  0.96 m/s  (Blank rows = not tested)  ABC: 45.6%   Not Performed this session:  Obstacle course in hallway, step ups, balancing, backwards and forwards walking 4 x 50 ft  Cross-body marches with 5# ankle weights (AW) x 3 laps in // bars; Airex feet apart eyes open/closed x 30s each; Airex feet apart eyes open with horizontal and vertical head turns x 30s each; Airex balance beam side stepping x multiple laps; Airex balance beam tandem stepping x multiple laps; Airex feet together eyes open/closed x 30s each; Airex feet together eyes open with horizontal and vertical head turns x 30s each; All balance exercises performed in parallel bars and without UE support unless otherwise specified: Fishing game reaching outside of BOS with no UE support x1 ea hand;  Blazepod balance on AE pad, semicircle reaching outside BOS, 6 pods, random light-up, 1 min, Trial 1: 19 hits,  Trial 2: 22 hits, Trial 3: 19 hits; Tandem stance with no UE support, 2 x 30 ea foot forward;  SLS with 2 finger UE support, 3 x 30 ea foot; MidRow Black TB 2 x 10 x 3 sec scap squeeze; Pallof Press Black TB  2 x 10 x 3 sec scap squeeze hold; Step ups to 6 step 2 x 10 ea LE;  Nautilus lunge chest press 2 x 10 ea LE in front;    PATIENT EDUCATION:  Education details: Pt educated throughout session about proper posture and technique with exercises. Improved exercise technique, movement at target joints, use of target muscles after min to mod verbal, visual, tactile cues. Outcome measures and goals Person educated: Patient Education method: Explanation, Demonstration, Verbal cues, and Handouts Education comprehension: verbalized understanding   HOME EXERCISE PROGRAM:  Access Code: [email or web address] URL: [email or web address] Date: 06/07/2023 Prepared by: Selinda Eck  Exercises - Sit to Stand Without Arm Support  - 1 x daily - 7 x weekly - 3 sets - 10 reps - Heel Raises with Counter Support  - 1 x daily - 7 x weekly - 3 sets - 10 reps - Standing Romberg to 1/2 Tandem Stance  - 1 x daily - 7 x weekly - 3 reps - 30s hold  ASSESSMENT:  CLINICAL IMPRESSION: Updated goals and outcome measures this session with patient. Pt met his goals of improving his BERG and TUG. BERG score improved from 49/56 to 53/56. TUG score improved from 19.7 seconds to 12.0 seconds. Both of these improvements indicate that the pt is at a decreased risk of falls compared to his initial evaluation on 05/30/23. He made good progress towards his goal of improving his ABC score initially but for some reason it has regressed again today. Questions and answers were reviewed to confirm correct interpretation. His self-selected pace improved initially but has remained relatively consistent since that time. This speed is still below necessary relevant speed for community mobility. is deferred today secondary  to time constraints. Plan for future sessions is to perform more ambulation activities that include dynamic or multi-tasking activities. Pt encouraged to follow-up as scheduled. He would benefit from continued physical therapy services at this time so he can increase his strength, decrease his ambulation falls risk, and improve his balance so he can participate safely in  his environment.   OBJECTIVE IMPAIRMENTS: Abnormal gait, decreased balance, difficulty walking, decreased strength, and dizziness.   ACTIVITY LIMITATIONS: standing, stairs, transfers, and caring for others  PARTICIPATION LIMITATIONS: meal prep, cleaning, laundry, shopping, and community activity  PERSONAL FACTORS: Age, Past/current experiences, Time since onset of injury/illness/exacerbation, and 3+ comorbidities: back pain, tremors, anxiety, depression are also affecting patient's functional outcome.   REHAB POTENTIAL: Good  CLINICAL DECISION MAKING: Unstable/unpredictable  EVALUATION COMPLEXITY: High   GOALS: Goals reviewed with patient? No  SHORT TERM GOALS: Target date: 07/11/2023  Pt will be independent with HEP in order to improve strength and balance in order to decrease fall risk and improve function at home. Baseline:  Goal status: ONGOING   LONG TERM GOALS: Target date: 08/22/2023   Pt will improve ABC by at least 13% in order to demonstrate clinically significant improvement in balance confidence.  Baseline: 62.2%, 07/12/23: 70%; 08/02/23: 45.6% Goal status: IN PROGRESS  2.  Pt will improve BERG by at least 3 points in order to demonstrate clinically significant improvement in balance.   Baseline: 49/56; 07/12/23: 52/56; 08/02/23: 53/56; Goal status: MET  3.  Pt will decrease TUG to below 14 seconds/decrease in order to demonstrate decreased fall risk.       Baseline: 19.7s, 07/12/23: 13.51 sec; 08/02/23: 12.0s Goal status: MET  4. Pt will increase self-selected 73m gait speed to at least 0.80 m/s in order to  demonstrate clinically significant improvement in community ambulation.     Baseline: 0.66 m/s, 07/12/23: 0.74 m/s; 08/02/23: 0.73 m/s Goal status: PARTIALLY MET  5. Pt will increase by at least 42m (113ft) in order to demonstrate clinically significant improvement in cardiopulmonary endurance and community ambulation      Baseline: 06/09/23: 916'; 08/02/23: Deferred Goal status: ONGOING  PLAN: PT FREQUENCY: 2x/week  PT DURATION: 8 weeks  PLANNED INTERVENTIONS: Therapeutic exercises, Therapeutic activity, Neuromuscular re-education, Balance training, Gait training, Patient/Family education, Self Care, Joint mobilization, Joint manipulation, Vestibular training, Canalith repositioning, Orthotic/Fit training, DME instructions, Dry Needling, Electrical stimulation, Spinal manipulation, Spinal mobilization, Cryotherapy, Moist heat, Taping, Traction, Ultrasound, Ionotophoresis 4mg /ml Dexamethasone , Manual therapy, and Re-evaluation.  PLAN FOR NEXT SESSION:  DGI, , progress strength/balance exercises, review/modify HEP as necessary;   Selinda BIRCH Marjani Kobel PT, DPT, GCS  08/16/2023, 2:30 PM

## 2023-08-17 ENCOUNTER — Ambulatory Visit: Attending: Internal Medicine

## 2023-08-17 DIAGNOSIS — M6281 Muscle weakness (generalized): Secondary | ICD-10-CM | POA: Insufficient documentation

## 2023-08-17 DIAGNOSIS — R2681 Unsteadiness on feet: Secondary | ICD-10-CM | POA: Insufficient documentation

## 2023-08-17 NOTE — Therapy (Incomplete)
 OUTPATIENT PHYSICAL THERAPY BALANCE TREATMENT  Patient Name: Wesley Preston. MRN: [id number] DOB:[birth year], 83 y.o., male Today's Date: 08/17/2023  END OF SESSION:   Past Medical History:  Diagnosis Date   Anxiety    Back pain    BPH (benign prostatic hyperplasia)    Cancer (HCC)    BASAL CELL   Chest pain, non-cardiac    Colon polyps    Complication of anesthesia    anesthesia stays a long time   COPD (chronic obstructive pulmonary disease) (HCC)    Depression    Essential tremor    Essential tremor    Family history of adverse reaction to anesthesia    DAUGHTERS GET NAUSEATED   GERD (gastroesophageal reflux disease)    H/O   Helicobacter pylori (H. pylori) infection    Hyperlipidemia    Neuromuscular disorder (HCC)    TREMORS   Reactive airway disease    Reactive airway disease    Restless leg syndrome    Past Surgical History:  Procedure Laterality Date   COLONOSCOPY  2013   COLONOSCOPY WITH PROPOFOL  N/A 03/21/2016   Procedure: COLONOSCOPY WITH PROPOFOL ;  Surgeon: Lamar ONEIDA Holmes, MD;  Location[facility] ENDOSCOPY;  Service: Endoscopy;  Laterality: N/A;   COLONOSCOPY WITH PROPOFOL  N/A 03/09/2018   Procedure: COLONOSCOPY WITH PROPOFOL ;  Surgeon: Holmes Lamar ONEIDA, MD;  Location: [facility] ENDOSCOPY;  Service: Endoscopy;  Laterality: N/A;   ESOPHAGOGASTRODUODENOSCOPY     ESOPHAGOGASTRODUODENOSCOPY N/A 03/09/2018   Procedure: ESOPHAGOGASTRODUODENOSCOPY (EGD);  Surgeon: Holmes Lamar ONEIDA, MD;  Location: [facility] ENDOSCOPY;  Service: Endoscopy;  Laterality: N/A;   HERNIA REPAIR     HOLEP-LASER ENUCLEATION OF THE PROSTATE WITH MORCELLATION N/A 05/10/2019   Procedure: HOLEP-LASER ENUCLEATION OF THE PROSTATE WITH MORCELLATION;  Surgeon: Francisca Redell BROCKS, MD;  Location: [facility] ORS;  Service: Urology;  Laterality: N/A;   INGUINAL HERNIA REPAIR Right 08/01/2014   Procedure: RIGHT INGUINAL HERNIA  REPAIR WITH MESH ;  Surgeon: Reyes LELON Cota, MD;  Location: [facility] ORS;  Service: General;   Laterality: Right; with large Ultra Pro mesh   NASAL SINUS SURGERY     ROTATOR CUFF REPAIR     TRIGGER FINGER RELEASE     Patient Active Problem List   Diagnosis Date Noted   Postprocedural stricture of urethra at fossa navicularis 07/09/2019   BPH with obstruction/lower urinary tract symptoms 08/21/2018   Restless leg syndrome 07/24/2018   Reactive airway disease 07/24/2018   Pure hypercholesterolemia 07/24/2018   Mechanical back pain 07/24/2018   History of Helicobacter pylori infection 07/24/2018   Essential tremor 07/24/2018   Depression 07/24/2018   BPH associated with nocturia 07/24/2018   Benign essential hypertension 07/24/2018   Anxiety 07/24/2018   Trigger finger of left thumb 04/14/2017   Hand pain, left 04/14/2017   Visual disturbance 07/18/2016   Tremor 07/18/2016   Imbalance 07/18/2016   Dizziness 07/18/2016   Right inguinal hernia 07/12/2014    PCP: Auston Reyes BIRCH, MD  REFERRING PROVIDER: Auston Reyes BIRCH, MD   REFERRING DIAG:  R26.89 (ICD-10-CM) - Other abnormalities of gait and mobility  R42 (ICD-10-CM) - Dizziness and giddiness   RATIONALE FOR EVALUATION AND TREATMENT: Rehabilitation  THERAPY DIAG: Unsteadiness on feet  Muscle weakness (generalized)  ONSET DATE: Multiple years  FOLLOW-UP APPT SCHEDULED WITH REFERRING PROVIDER: Yes, neurology;  FROM INITIAL EVALUATION SUBJECTIVE:  SUBJECTIVE STATEMENT:  Unsteadiness  PERTINENT HISTORY:  Patient with worsening gait and balance. Feels he is dragging his feet. Infrequently using assistive device. Improved RLS since starting gabapentin. Intermittent episodes of vertigo with position changes.  He goes to [employer] for exercise 1-2x/wk. Ongoing tremors in bilateral hands, worse with activity or stress. He reports  4 falls with the most recent about 1 week ago. Pt reports that he was stepping backwards when he fell. He couldn't get up and had to call extended family to help him from the ground. His wife was not strong enough to assist. He denies any significant injury from the fall. He was referred to PT for sensory ataxia and it was recommended he use a walker as well as perform balance exercise.  08/02/2022 MR BRAIN W WO MR ORBITS W WO IMPRESSION:  1. Atrophic right optic nerve which may reflect sequela of prior  optic neuritis.  2. Otherwise unremarkable appearance of the orbits with no acute  finding.  3. No acute intracranial pathology.  4. Moderate background chronic small-vessel ischemic change.   12/30/2019 MR BRAIN WO IMPRESSION:  No evidence of recent infarction, hemorrhage, or mass. Mild progression of parenchymal volume loss and probable chronic microvascular ischemic changes since 2018. Small chronic left pontine infarcts.   06/10/16 CAROTID ARTERY ULTRASOUND  IMPRESSION: 1. Minimal bilateral carotid bifurcation atherosclerotic vascular plaque. No flow limiting stenosis. Degree of stenosis less than 50% bilaterally.  2. Vertebrals are patent with antegrade flow.  06/02/16  MRI BRAIN WO CONTRAST  IMPRESSION: Atrophy. Small vessel disease. Evidence for chronic brainstem lacunar infarction. No acute intracranial findings. Chronic pansinusitis. Despite prior surgery, significant fluid accumulation and mucosal thickening can be seen, particularly in the RIGHT greater than LEFT frontoethmoid regions.    Pain: No Numbness/Tingling: No, but reports some feet burning; Focal Weakness: No Recent changes in overall health/medication: Yes, adjustment of meds Prior history of physical therapy for balance:  No Dominant hand: right Imaging: Yes  Red flags: Positive for basal cell carcinoma, Negative for abdominal pain, chills/fever, night sweats, nausea, vomiting, unrelenting  pain  PRECAUTIONS: Fall  WEIGHT BEARING RESTRICTIONS: No  FALLS: Has patient fallen in last 6 months? Yes. Number of falls 1, Directional pattern for falls: No  Living Environment Lives with: lives with their spouse Lives in: House/apartment Stairs: one level home, 2 steps to enter from the garage with L rail during ascend.  Has following equipment at home: grab bars in walk-in shower, no seat, rollator (uses periodically)   Prior level of function: Independent  Occupational demands: Retired Nutritional therapist: hunting (I can't get into the stand anymore), previously like to play golf but he can no longer bend over to put the ball on the tee;  Patient Goals: Pt would like to improve his balance   OBJECTIVE:   Patient Surveys  ABC: To be completed  Cognition Patient is oriented to person, place, and time.  Recent memory is intact.  Remote memory is intact.  Attention span and concentration are intact.  Expressive speech is intact.  Patient's fund of knowledge is within normal limits for educational level.    Gross Musculoskeletal Assessment Tremor: BUE tremors observed, worsened with stress; Bulk: Normal Tone: Normal  Posture: Mild forward head and rounded shoulders  AROM Deferred  LE MMT: MMT (out of 5) Right  Left   Hip flexion 4 4-  Hip extension    Hip abduction (seated) 5 5  Hip adduction (seated) 5 5  Hip  internal rotation    Hip external rotation    Knee flexion 5 5  Knee extension 4+ 4+  Ankle dorsiflexion 4+ 4+  Ankle plantarflexion Active Active  (* = pain; Blank rows = not tested)  Sensation Grossly intact to light touch throughout bilateral LEs as determined by testing dermatomes L2-S2. Proprioception, stereognosis, and hot/cold testing deferred on this date.  Reflexes R/L Knee Jerk (L3/4): 2+/2+  Ankle Jerk (S1/2): 2+/2+   Cranial Nerves Deferred  Coordination/Cerebellar Deferred  Transfers: Assistive device utilized: None   Sit to stand: Complete Independence Stand to sit: Complete Independence Chair to chair: Complete Independence Floor: Not assessed  Stairs: Level of Assistance: Complete Independence Stair Negotiation Technique: Alternating Pattern  with Bilateral Rails Number of Stairs: 4  Height of Stairs: 6  Comments: Decreased speed but good stability noted with single or bilateral UE support on rails  Gait: Gait pattern: decreased step length- Right, decreased step length- Left, poor foot clearance- Right, and poor foot clearance- Left Distance walked: 100' Assistive device utilized: None Level of assistance: Complete Independence Comments: Pt ambulates with decreased self-selected speed. Decreased step length bilaterally  BPPV TESTS:  Symptoms Duration Intensity Nystagmus  L Dix-Hallpike None   None  R Dix-Hallpike None   None  L Head Roll None   None  R Head Roll None   None  L Sidelying Test      R Sidelying Test      (blank = not tested)  mCTSIB: 5.4s  Functional Outcome Measures  05/30/23 06/07/23 Comments  BERG 49/56    DGI  17/24   FGA     TUG 19.7 seconds    5TSTS 13.0 seconds    6 Minute Walk Test     10 Meter Gait Speed Self-selected: 15.2s = 0.66 m/s; Fastest: 11.8s = 0.85 m/s    (Blank rows = not tested)  Coordination/Cerebellar Testing Finger to Nose: Dysmetria BUE but difficult to assess secondary to tremor; Heel to Shin: WNL Rapid alternating movements: WNL Finger Opposition: WNL Pronator Drift: Negative  Functional Outcome Measures 07/12/23  05/30/23 06/07/23 07/12/23  BERG 49/56  52/56  DGI  17/24   FGA     TUG 19.7 seconds  13.51 seconds  5TSTS 13.0 seconds  11.30 seconds  6 Minute Walk Test     10 Meter Gait Speed Self-selected: 15.2s = 0.66 m/s; Fastest: 11.8s = 0.85 m/s  Self-Selected: 13.46= 0.74 m/s; Fastest: 11.13 = 0.90 m/s  (Blank rows = not tested)   TODAY'S TREATMENT: 08/02/23   SUBJECTIVE: Pt reports that he is doing well today. No specific  pain or soreness upon arrival. No reported falls since last session. No specific questions.   PAIN: Denies   Ther-Act  Recert vs. Discharge  NuStep L5 x 10 mins (5 mins unbilled) for BLE strengthening and warm-up during interval history;  DGI, , progress strength/balance exercises, review/modify HEP as necessary;   Standing exercises with 5# AW: Hip flexion marches x 10 BLE; Hamstring curls x 10 BLE; Hip abduction x 10 BLE; Hip extension x 10 BLE;  Seated clams with manual resistance from therapist x 10 BLE; Seated adductor squeezes with manual resistance from therapist x 10 BLE; Seated alternating LAQ with 5# AW x 10 BLE;  Functional Outcome Measures 07/12/23  05/30/23 06/07/23 07/12/23 08/02/23  BERG 49/56  52/56 53/56  DGI  17/24    FGA      TUG 19.7 seconds  13.51 seconds 12.0s  5TSTS 13.0 seconds  11.30 seconds 10.8s  6 Minute Walk Test      10 Meter Gait Speed Self-selected: 15.2s = 0.66 m/s; Fastest: 11.8s = 0.85 m/s  Self-Selected: 13.46= 0.74 m/s; Fastest: 11.13 = 0.90 m/s Self-Selected: 13.7s =  0.73 m/s; Fastest:  10.4s =  0.96 m/s  (Blank rows = not tested)  ABC: 45.6%   Not Performed this session:  Obstacle course in hallway, step ups, balancing, backwards and forwards walking 4 x 50 ft  Cross-body marches with 5# ankle weights (AW) x 3 laps in // bars; Airex feet apart eyes open/closed x 30s each; Airex feet apart eyes open with horizontal and vertical head turns x 30s each; Airex balance beam side stepping x multiple laps; Airex balance beam tandem stepping x multiple laps; Airex feet together eyes open/closed x 30s each; Airex feet together eyes open with horizontal and vertical head turns x 30s each; All balance exercises performed in parallel bars and without UE support unless otherwise specified: Fishing game reaching outside of BOS with no UE support x1 ea hand;  Blazepod balance on AE pad, semicircle reaching outside BOS, 6 pods, random light-up, 1  min, Trial 1: 19 hits, Trial 2: 22 hits, Trial 3: 19 hits; Tandem stance with no UE support, 2 x 30 ea foot forward;  SLS with 2 finger UE support, 3 x 30 ea foot; MidRow Black TB 2 x 10 x 3 sec scap squeeze; Pallof Press Black TB  2 x 10 x 3 sec scap squeeze hold; Step ups to 6 step 2 x 10 ea LE;  Nautilus lunge chest press 2 x 10 ea LE in front;    PATIENT EDUCATION:  Education details: Pt educated throughout session about proper posture and technique with exercises. Improved exercise technique, movement at target joints, use of target muscles after min to mod verbal, visual, tactile cues. Outcome measures and goals Person educated: Patient Education method: Explanation, Demonstration, Verbal cues, and Handouts Education comprehension: verbalized understanding   HOME EXERCISE PROGRAM:  Access Code: [email or web address] URL: [email or web address] Date: 06/07/2023 Prepared by: Selinda Eck  Exercises - Sit to Stand Without Arm Support  - 1 x daily - 7 x weekly - 3 sets - 10 reps - Heel Raises with Counter Support  - 1 x daily - 7 x weekly - 3 sets - 10 reps - Standing Romberg to 1/2 Tandem Stance  - 1 x daily - 7 x weekly - 3 reps - 30s hold  ASSESSMENT:  CLINICAL IMPRESSION: Updated goals and outcome measures this session with patient. Pt met his goals of improving his BERG and TUG. BERG score improved from 49/56 to 53/56. TUG score improved from 19.7 seconds to 12.0 seconds. Both of these improvements indicate that the pt is at a decreased risk of falls compared to his initial evaluation on 05/30/23. He made good progress towards his goal of improving his ABC score initially but for some reason it has regressed again today. Questions and answers were reviewed to confirm correct interpretation. His self-selected pace improved initially but has remained relatively consistent since that time. This speed is still below necessary relevant speed for community mobility. is  deferred today secondary to time constraints. Plan for future sessions is to perform more ambulation activities that include dynamic or multi-tasking activities. Pt encouraged to follow-up as scheduled. He would benefit from continued physical therapy services at this time so he can increase his strength, decrease his ambulation falls risk, and improve his balance so he  can participate safely in his environment.   OBJECTIVE IMPAIRMENTS: Abnormal gait, decreased balance, difficulty walking, decreased strength, and dizziness.   ACTIVITY LIMITATIONS: standing, stairs, transfers, and caring for others  PARTICIPATION LIMITATIONS: meal prep, cleaning, laundry, shopping, and community activity  PERSONAL FACTORS: Age, Past/current experiences, Time since onset of injury/illness/exacerbation, and 3+ comorbidities: back pain, tremors, anxiety, depression are also affecting patient's functional outcome.   REHAB POTENTIAL: Good  CLINICAL DECISION MAKING: Unstable/unpredictable  EVALUATION COMPLEXITY: High   GOALS: Goals reviewed with patient? No  SHORT TERM GOALS: Target date: 07/11/2023  Pt will be independent with HEP in order to improve strength and balance in order to decrease fall risk and improve function at home. Baseline:  Goal status: ONGOING   LONG TERM GOALS: Target date: 08/22/2023   Pt will improve ABC by at least 13% in order to demonstrate clinically significant improvement in balance confidence.  Baseline: 62.2%, 07/12/23: 70%; 08/02/23: 45.6% Goal status: IN PROGRESS  2.  Pt will improve BERG by at least 3 points in order to demonstrate clinically significant improvement in balance.   Baseline: 49/56; 07/12/23: 52/56; 08/02/23: 53/56; Goal status: MET  3.  Pt will decrease TUG to below 14 seconds/decrease in order to demonstrate decreased fall risk.       Baseline: 19.7s, 07/12/23: 13.51 sec; 08/02/23: 12.0s Goal status: MET  4. Pt will increase self-selected 74m gait speed to at  least 0.80 m/s in order to demonstrate clinically significant improvement in community ambulation.     Baseline: 0.66 m/s, 07/12/23: 0.74 m/s; 08/02/23: 0.73 m/s Goal status: PARTIALLY MET  5. Pt will increase by at least 83m (157ft) in order to demonstrate clinically significant improvement in cardiopulmonary endurance and community ambulation      Baseline: 06/09/23: 916'; 08/02/23: Deferred Goal status: ONGOING  PLAN: PT FREQUENCY: 2x/week  PT DURATION: 8 weeks  PLANNED INTERVENTIONS: Therapeutic exercises, Therapeutic activity, Neuromuscular re-education, Balance training, Gait training, Patient/Family education, Self Care, Joint mobilization, Joint manipulation, Vestibular training, Canalith repositioning, Orthotic/Fit training, DME instructions, Dry Needling, Electrical stimulation, Spinal manipulation, Spinal mobilization, Cryotherapy, Moist heat, Taping, Traction, Ultrasound, Ionotophoresis 4mg /ml Dexamethasone , Manual therapy, and Re-evaluation.  PLAN FOR NEXT SESSION:  DGI, , progress strength/balance exercises, review/modify HEP as necessary;   Selinda BIRCH Sulamita Lafountain PT, DPT, GCS  08/17/2023, 1:22 PM

## 2023-08-21 ENCOUNTER — Ambulatory Visit

## 2023-08-21 DIAGNOSIS — R2681 Unsteadiness on feet: Secondary | ICD-10-CM

## 2023-08-21 DIAGNOSIS — M6281 Muscle weakness (generalized): Secondary | ICD-10-CM

## 2023-08-23 ENCOUNTER — Ambulatory Visit

## 2023-08-23 NOTE — Therapy (Unsigned)
 OUTPATIENT PHYSICAL THERAPY BALANCE TREATMENT/RECERTIFICATION  Patient Name: Wesley Preston. MRN: [id number] DOB:March 10, 1940, 83 y.o., male Today's Date: 08/29/2023  END OF SESSION:  PT End of Session - 08/28/23 1109     Visit Number 18    Number of Visits 41    Date for PT Re-Evaluation 10/23/23    Authorization Type eval: 05/30/23, [employer] AUTH Approved [id number] visits between 08/17/23-09/14/23    Authorization - Visit Number 1    Authorization - Number of Visits 8    PT Start Time 1110    PT Stop Time 1145    PT Time Calculation (min) 35 min    Equipment Utilized During Treatment Gait belt    Activity Tolerance Patient tolerated treatment well    Behavior During Therapy WFL for tasks assessed/performed          Past Medical History:  Diagnosis Date   Anxiety    Back pain    BPH (benign prostatic hyperplasia)    Cancer (HCC)    BASAL CELL   Chest pain, non-cardiac    Colon polyps    Complication of anesthesia    anesthesia stays a long time   COPD (chronic obstructive pulmonary disease) (HCC)    Depression    Essential tremor    Essential tremor    Family history of adverse reaction to anesthesia    DAUGHTERS GET NAUSEATED   GERD (gastroesophageal reflux disease)    H/O   Helicobacter pylori (H. pylori) infection    Hyperlipidemia    Neuromuscular disorder (HCC)    TREMORS   Reactive airway disease    Reactive airway disease    Restless leg syndrome    Past Surgical History:  Procedure Laterality Date   COLONOSCOPY  2013   COLONOSCOPY WITH PROPOFOL  N/A 03/21/2016   Procedure: COLONOSCOPY WITH PROPOFOL ;  Surgeon: Lamar ONEIDA Holmes, MD;  Location: [facility] ENDOSCOPY;  Service: Endoscopy;  Laterality: N/A;   COLONOSCOPY WITH PROPOFOL  N/A 03/09/2018   Procedure: COLONOSCOPY WITH PROPOFOL ;  Surgeon: Holmes Lamar ONEIDA, MD;  Location: [facility] ENDOSCOPY;  Service: Endoscopy;  Laterality: N/A;   ESOPHAGOGASTRODUODENOSCOPY     ESOPHAGOGASTRODUODENOSCOPY N/A 03/09/2018    Procedure: ESOPHAGOGASTRODUODENOSCOPY (EGD);  Surgeon: Holmes Lamar ONEIDA, MD;  Location: [facility] ENDOSCOPY;  Service: Endoscopy;  Laterality: N/A;   HERNIA REPAIR     HOLEP-LASER ENUCLEATION OF THE PROSTATE WITH MORCELLATION N/A 05/10/2019   Procedure: HOLEP-LASER ENUCLEATION OF THE PROSTATE WITH MORCELLATION;  Surgeon: Francisca Redell BROCKS, MD;  Location: [facility] ORS;  Service: Urology;  Laterality: N/A;   INGUINAL HERNIA REPAIR Right 08/01/2014   Procedure: RIGHT INGUINAL HERNIA  REPAIR WITH MESH ;  Surgeon: Reyes LELON Cota, MD;  Location: [facility] ORS;  Service: General;  Laterality: Right; with large Ultra Pro mesh   NASAL SINUS SURGERY     ROTATOR CUFF REPAIR     TRIGGER FINGER RELEASE     Patient Active Problem List   Diagnosis Date Noted   Postprocedural stricture of urethra at fossa navicularis 07/09/2019   BPH with obstruction/lower urinary tract symptoms 08/21/2018   Restless leg syndrome 07/24/2018   Reactive airway disease 07/24/2018   Pure hypercholesterolemia 07/24/2018   Mechanical back pain 07/24/2018   History of Helicobacter pylori infection 07/24/2018   Essential tremor 07/24/2018   Depression 07/24/2018   BPH associated with nocturia 07/24/2018   Benign essential hypertension 07/24/2018   Anxiety 07/24/2018   Trigger finger of left thumb 04/14/2017   Hand pain, left 04/14/2017   Visual  disturbance 07/18/2016   Tremor 07/18/2016   Imbalance 07/18/2016   Dizziness 07/18/2016   Right inguinal hernia 07/12/2014    PCP: Auston Reyes BIRCH, MD  REFERRING PROVIDER: Auston Reyes BIRCH, MD   REFERRING DIAG:  R26.89 (ICD-10-CM) - Other abnormalities of gait and mobility  R42 (ICD-10-CM) - Dizziness and giddiness   RATIONALE FOR EVALUATION AND TREATMENT: Rehabilitation  THERAPY DIAG: Unsteadiness on feet  Muscle weakness (generalized)  ONSET DATE: Multiple years  FOLLOW-UP APPT SCHEDULED WITH REFERRING PROVIDER: Yes, neurology;  FROM INITIAL EVALUATION SUBJECTIVE:                                                                                                                                                                                          SUBJECTIVE STATEMENT:  Unsteadiness  PERTINENT HISTORY:  Patient with worsening gait and balance. Feels he is dragging his feet. Infrequently using assistive device. Improved RLS since starting gabapentin. Intermittent episodes of vertigo with position changes.  He goes to [employer] for exercise 1-2x/wk. Ongoing tremors in bilateral hands, worse with activity or stress. He reports 4 falls with the most recent about 1 week ago. Pt reports that he was stepping backwards when he fell. He couldn't get up and had to call extended family to help him from the ground. His wife was not strong enough to assist. He denies any significant injury from the fall. He was referred to PT for sensory ataxia and it was recommended he use a walker as well as perform balance exercise.  08/02/2022 MR BRAIN W WO MR ORBITS W WO IMPRESSION:  1. Atrophic right optic nerve which may reflect sequela of prior  optic neuritis.  2. Otherwise unremarkable appearance of the orbits with no acute  finding.  3. No acute intracranial pathology.  4. Moderate background chronic small-vessel ischemic change.   12/30/2019 MR BRAIN WO IMPRESSION:  No evidence of recent infarction, hemorrhage, or mass. Mild progression of parenchymal volume loss and probable chronic microvascular ischemic changes since 2018. Small chronic left pontine infarcts.   06/10/16 CAROTID ARTERY ULTRASOUND  IMPRESSION: 1. Minimal bilateral carotid bifurcation atherosclerotic vascular plaque. No flow limiting stenosis. Degree of stenosis less than 50% bilaterally.  2. Vertebrals are patent with antegrade flow.  06/02/16  MRI BRAIN WO CONTRAST  IMPRESSION: Atrophy. Small vessel disease. Evidence for chronic brainstem lacunar infarction. No acute intracranial findings. Chronic  pansinusitis. Despite prior surgery, significant fluid accumulation and mucosal thickening can be seen, particularly in the RIGHT greater than LEFT frontoethmoid regions.    Pain: No Numbness/Tingling: No, but reports some feet burning; Focal Weakness: No Recent changes in overall health/medication: Yes,  adjustment of meds Prior history of physical therapy for balance:  No Dominant hand: right Imaging: Yes  Red flags: Positive for basal cell carcinoma, Negative for abdominal pain, chills/fever, night sweats, nausea, vomiting, unrelenting pain  PRECAUTIONS: Fall  WEIGHT BEARING RESTRICTIONS: No  FALLS: Has patient fallen in last 6 months? Yes. Number of falls 1, Directional pattern for falls: No  Living Environment Lives with: lives with their spouse Lives in: House/apartment Stairs: one level home, 2 steps to enter from the garage with L rail during ascend.  Has following equipment at home: grab bars in walk-in shower, no seat, rollator (uses periodically)   Prior level of function: Independent  Occupational demands: Retired Nutritional therapist: hunting (I can't get into the stand anymore), previously like to play golf but he can no longer bend over to put the ball on the tee;  Patient Goals: Pt would like to improve his balance   OBJECTIVE:   Patient Surveys  ABC: To be completed  Cognition Patient is oriented to person, place, and time.  Recent memory is intact.  Remote memory is intact.  Attention span and concentration are intact.  Expressive speech is intact.  Patient's fund of knowledge is within normal limits for educational level.    Gross Musculoskeletal Assessment Tremor: BUE tremors observed, worsened with stress; Bulk: Normal Tone: Normal  Posture: Mild forward head and rounded shoulders  AROM Deferred  LE MMT: MMT (out of 5) Right  Left   Hip flexion 4 4-  Hip extension    Hip abduction (seated) 5 5  Hip adduction (seated) 5 5  Hip  internal rotation    Hip external rotation    Knee flexion 5 5  Knee extension 4+ 4+  Ankle dorsiflexion 4+ 4+  Ankle plantarflexion Active Active  (* = pain; Blank rows = not tested)  Sensation Grossly intact to light touch throughout bilateral LEs as determined by testing dermatomes L2-S2. Proprioception, stereognosis, and hot/cold testing deferred on this date.  Reflexes R/L Knee Jerk (L3/4): 2+/2+  Ankle Jerk (S1/2): 2+/2+   Cranial Nerves Deferred  Coordination/Cerebellar Deferred  Transfers: Assistive device utilized: None  Sit to stand: Complete Independence Stand to sit: Complete Independence Chair to chair: Complete Independence Floor: Not assessed  Stairs: Level of Assistance: Complete Independence Stair Negotiation Technique: Alternating Pattern  with Bilateral Rails Number of Stairs: 4  Height of Stairs: 6  Comments: Decreased speed but good stability noted with single or bilateral UE support on rails  Gait: Gait pattern: decreased step length- Right, decreased step length- Left, poor foot clearance- Right, and poor foot clearance- Left Distance walked: 100' Assistive device utilized: None Level of assistance: Complete Independence Comments: Pt ambulates with decreased self-selected speed. Decreased step length bilaterally  BPPV TESTS:  Symptoms Duration Intensity Nystagmus  L Dix-Hallpike None   None  R Dix-Hallpike None   None  L Head Roll None   None  R Head Roll None   None  L Sidelying Test      R Sidelying Test      (blank = not tested)  mCTSIB: 5.4s  Functional Outcome Measures  05/30/23 06/07/23 Comments  BERG 49/56    DGI  17/24   FGA     TUG 19.7 seconds    5TSTS 13.0 seconds    6 Minute Walk Test     10 Meter Gait Speed Self-selected: 15.2s = 0.66 m/s; Fastest: 11.8s = 0.85 m/s    (Blank rows = not tested)  Coordination/Cerebellar Testing Finger to Nose: Dysmetria BUE but difficult to assess secondary to tremor; Heel to Shin:  WNL Rapid alternating movements: WNL Finger Opposition: WNL Pronator Drift: Negative  Functional Outcome Measures 07/12/23  05/30/23 06/07/23 07/12/23  BERG 49/56  52/56  DGI  17/24   FGA     TUG 19.7 seconds  13.51 seconds  5TSTS 13.0 seconds  11.30 seconds  6 Minute Walk Test     10 Meter Gait Speed Self-selected: 15.2s = 0.66 m/s; Fastest: 11.8s = 0.85 m/s  Self-Selected: 13.46= 0.74 m/s; Fastest: 11.13 = 0.90 m/s  (Blank rows = not tested)   TODAY'S TREATMENT: 08/28/23   SUBJECTIVE: Pt fell while at the airport on the first day of his trip. No injury reported but he was frustrated that he fell. He also tested positive for COVID at the end of his trip. No specific questions upon arrival and no pain.   PAIN: Denies   Therapeutic Activity  Functional Outcome Measures 08/28/23  05/30/23 06/07/23 06/09/23 07/12/23 08/02/23 8/25/254  BERG 49/56   52/56 53/56 48/56   DGI  17/24    18/24  FGA        TUG 19.7 seconds   13.51 seconds 12.0s 10.8s  5TSTS 13.0 seconds   11.30 seconds 10.8s 10.6s  6 Minute Walk Test   916'   856'  10 Meter Gait Speed Self-selected: 15.2s = 0.66 m/s; Fastest: 11.8s = 0.85 m/s   Self-Selected: 13.46= 0.74 m/s; Fastest: 11.13 = 0.90 m/s Self-Selected: 13.7s =  0.73 m/s; Fastest:  10.4s =  0.96 m/s Self-Selected: 13.6s =  0.74 m/s; Fastest:  10.9s =  0.92 m/s  (Blank rows = not tested)  ABC: 66.6%;  Pre-vitals: Seated: BP: 139/58 mmHg, HR: 70 bpm, SpO2: 97% Post-vitals: Seated: BP: 146/74 mmHg, HR: 78 bpm, SpO2: 90% (7/10 on BORG);   Not performed: Standing exercises with 5# AW: Hip flexion marches x 10 BLE; Hamstring curls x 10 BLE; Hip abduction x 10 BLE; Hip extension x 10 BLE; Seated clams with manual resistance from therapist x 10 BLE; Seated adductor squeezes with manual resistance from therapist x 10 BLE; Seated alternating LAQ with 5# AW x 10 BLE; Obstacle course in hallway, step ups, balancing, backwards and forwards walking 4 x 50 ft   Cross-body marches with 5# ankle weights (AW) x 3 laps in // bars; Airex feet apart eyes open/closed x 30s each; Airex feet apart eyes open with horizontal and vertical head turns x 30s each; Airex balance beam side stepping x multiple laps; Airex balance beam tandem stepping x multiple laps; Airex feet together eyes open/closed x 30s each; Airex feet together eyes open with horizontal and vertical head turns x 30s each; Tandem stance with no UE support, 2 x 30 ea foot forward;  SLS with 2 finger UE support, 3 x 30 ea foot; MidRow Black TB 2 x 10 x 3 sec scap squeeze; Pallof Press Black TB  2 x 10 x 3 sec scap squeeze hold; Step ups to 6 step 2 x 10 ea LE;  Nautilus lunge chest press 2 x 10 ea LE in front;    PATIENT EDUCATION:  Education details: Outcome measures and goals Person educated: Patient Education method: Explanation, Demonstration, and Verbal cues Education comprehension: verbalized understanding   HOME EXERCISE PROGRAM:  Access Code: [email or web address] URL: [email or web address] Date: 06/07/2023 Prepared by: Selinda Eck  Exercises - Sit to Stand Without Arm Support  - 1 x daily - 7 x weekly -  3 sets - 10 reps - Heel Raises with Counter Support  - 1 x daily - 7 x weekly - 3 sets - 10 reps - Standing Romberg to 1/2 Tandem Stance  - 1 x daily - 7 x weekly - 3 reps - 30s hold  ASSESSMENT:  CLINICAL IMPRESSION: Updated goals and outcome measures this session with patient. BERG score had previously improved however since his most recent fall he has regressed back to 48/56. His TUG score continues to show improvement from 19.7 seconds initially to 10.8 seconds today. He made good progress towards his goal of improving his ABC score since the initial evaluation but is still not above the cut-off score. His self-selected pace improved initially but has remained relatively consistent since that time. This speed is still below necessary relevant speed for  community mobility. was slightly less today than the last update. Plan for future sessions is to progress balance and endurance activities. Pt encouraged to follow-up as scheduled. He would benefit from continued physical therapy services at this time so he can increase his strength, decrease his ambulation falls risk, and improve his balance so he can participate safely in his environment.   OBJECTIVE IMPAIRMENTS: Abnormal gait, decreased balance, difficulty walking, decreased strength, and dizziness.   ACTIVITY LIMITATIONS: standing, stairs, transfers, and caring for others  PARTICIPATION LIMITATIONS: meal prep, cleaning, laundry, shopping, and community activity  PERSONAL FACTORS: Age, Past/current experiences, Time since onset of injury/illness/exacerbation, and 3+ comorbidities: back pain, tremors, anxiety, depression are also affecting patient's functional outcome.   REHAB POTENTIAL: Good  CLINICAL DECISION MAKING: Unstable/unpredictable  EVALUATION COMPLEXITY: High   GOALS: Goals reviewed with patient? No  SHORT TERM GOALS: Target date:  Pt will be independent with HEP in order to improve strength and balance in order to decrease fall risk and improve function at home. Baseline:  Goal status: MET   LONG TERM GOALS: Target date: 10/23/2023   Pt will improve ABC by at least 13% in order to demonstrate clinically significant improvement in balance confidence.  Baseline: 62.2%, 07/12/23: 70%; 08/02/23: 45.6%; 08/28/23: 66.6% Goal status: PARTIALLY MET  2.  Pt will improve BERG by at least 3 points in order to demonstrate clinically significant improvement in balance.   Baseline: 49/56; 07/12/23: 52/56; 08/02/23: 53/56; 08/28/23: 48/56; Goal status: ONGOING  3.  Pt will decrease TUG to below 14 seconds/decrease in order to demonstrate decreased fall risk.       Baseline: 19.7s, 07/12/23: 13.51 sec; 08/02/23: 12.0s; 08/28/23: 10.8s Goal status: MET  4. Pt will increase  self-selected 57m gait speed to at least 0.80 m/s in order to demonstrate clinically significant improvement in community ambulation.     Baseline: 0.66 m/s, 07/12/23: 0.74 m/s; 08/02/23: 0.73 m/s; 08/28/23: 13.6s =  0.74 m/s; Goal status: PARTIALLY MET  5. Pt will increase by at least 25m (123ft) in order to demonstrate clinically significant improvement in cardiopulmonary endurance and community ambulation      Baseline: 06/09/23: 916'; 08/02/23: Deferred; 08/28/23: 856'; Goal status: ONGOING  PLAN: PT FREQUENCY: 2x/week  PT DURATION: 8 weeks  PLANNED INTERVENTIONS: Therapeutic exercises, Therapeutic activity, Neuromuscular re-education, Balance training, Gait training, Patient/Family education, Self Care, Joint mobilization, Joint manipulation, Vestibular training, Canalith repositioning, Orthotic/Fit training, DME instructions, Dry Needling, Electrical stimulation, Spinal manipulation, Spinal mobilization, Cryotherapy, Moist heat, Taping, Traction, Ultrasound, Ionotophoresis 4mg /ml Dexamethasone , Manual therapy, and Re-evaluation.  PLAN FOR NEXT SESSION: progress balance and endurance exercises, review/modify HEP as necessary;   Xiamara Hulet D Jahlon Baines  PT, DPT, GCS  08/29/2023, 8:30 AM

## 2023-08-28 ENCOUNTER — Ambulatory Visit

## 2023-08-28 DIAGNOSIS — R2681 Unsteadiness on feet: Secondary | ICD-10-CM

## 2023-08-28 DIAGNOSIS — M6281 Muscle weakness (generalized): Secondary | ICD-10-CM

## 2023-08-29 NOTE — Therapy (Signed)
 OUTPATIENT PHYSICAL THERAPY BALANCE TREATMENT  Patient Name: Wesley Preston. MRN: [id number] DOB:July 06, 1940, 83 y.o., male Today's Date: 08/30/2023  END OF SESSION:  PT End of Session - 08/30/23 1110     Visit Number 19    Number of Visits 41    Date for PT Re-Evaluation 10/23/23    Authorization Type eval: 05/30/23, [employer] AUTH Approved [id number] visits between 08/17/23-09/14/23    Authorization - Visit Number 2    Authorization - Number of Visits 8    PT Start Time 1110    PT Stop Time 1143    PT Time Calculation (min) 33 min    Equipment Utilized During Treatment Gait belt    Activity Tolerance Patient tolerated treatment well    Behavior During Therapy WFL for tasks assessed/performed         Past Medical History:  Diagnosis Date   Anxiety    Back pain    BPH (benign prostatic hyperplasia)    Cancer (HCC)    BASAL CELL   Chest pain, non-cardiac    Colon polyps    Complication of anesthesia    anesthesia stays a long time   COPD (chronic obstructive pulmonary disease) (HCC)    Depression    Essential tremor    Essential tremor    Family history of adverse reaction to anesthesia    DAUGHTERS GET NAUSEATED   GERD (gastroesophageal reflux disease)    H/O   Helicobacter pylori (H. pylori) infection    Hyperlipidemia    Neuromuscular disorder (HCC)    TREMORS   Reactive airway disease    Reactive airway disease    Restless leg syndrome    Past Surgical History:  Procedure Laterality Date   COLONOSCOPY  2013   COLONOSCOPY WITH PROPOFOL  N/A 03/21/2016   Procedure: COLONOSCOPY WITH PROPOFOL ;  Surgeon: Lamar ONEIDA Holmes, MD;  Location: [facility] ENDOSCOPY;  Service: Endoscopy;  Laterality: N/A;   COLONOSCOPY WITH PROPOFOL  N/A 03/09/2018   Procedure: COLONOSCOPY WITH PROPOFOL ;  Surgeon: Holmes Lamar ONEIDA, MD;  Location: [facility] ENDOSCOPY;  Service: Endoscopy;  Laterality: N/A;   ESOPHAGOGASTRODUODENOSCOPY     ESOPHAGOGASTRODUODENOSCOPY N/A 03/09/2018   Procedure:  ESOPHAGOGASTRODUODENOSCOPY (EGD);  Surgeon: Holmes Lamar ONEIDA, MD;  Location: [facility] ENDOSCOPY;  Service: Endoscopy;  Laterality: N/A;   HERNIA REPAIR     HOLEP-LASER ENUCLEATION OF THE PROSTATE WITH MORCELLATION N/A 05/10/2019   Procedure: HOLEP-LASER ENUCLEATION OF THE PROSTATE WITH MORCELLATION;  Surgeon: Francisca Redell BROCKS, MD;  Location: [facility] ORS;  Service: Urology;  Laterality: N/A;   INGUINAL HERNIA REPAIR Right 08/01/2014   Procedure: RIGHT INGUINAL HERNIA  REPAIR WITH MESH ;  Surgeon: Reyes LELON Cota, MD;  Location: [facility] ORS;  Service: General;  Laterality: Right; with large Ultra Pro mesh   NASAL SINUS SURGERY     ROTATOR CUFF REPAIR     TRIGGER FINGER RELEASE     Patient Active Problem List   Diagnosis Date Noted   Postprocedural stricture of urethra at fossa navicularis 07/09/2019   BPH with obstruction/lower urinary tract symptoms 08/21/2018   Restless leg syndrome 07/24/2018   Reactive airway disease 07/24/2018   Pure hypercholesterolemia 07/24/2018   Mechanical back pain 07/24/2018   History of Helicobacter pylori infection 07/24/2018   Essential tremor 07/24/2018   Depression 07/24/2018   BPH associated with nocturia 07/24/2018   Benign essential hypertension 07/24/2018   Anxiety 07/24/2018   Trigger finger of left thumb 04/14/2017   Hand pain, left 04/14/2017   Visual disturbance  07/18/2016   Tremor 07/18/2016   Imbalance 07/18/2016   Dizziness 07/18/2016   Right inguinal hernia 07/12/2014    PCP: Auston Reyes BIRCH, MD  REFERRING PROVIDER: Auston Reyes BIRCH, MD   REFERRING DIAG:  R26.89 (ICD-10-CM) - Other abnormalities of gait and mobility  R42 (ICD-10-CM) - Dizziness and giddiness   RATIONALE FOR EVALUATION AND TREATMENT: Rehabilitation  THERAPY DIAG: Unsteadiness on feet  Muscle weakness (generalized)  ONSET DATE: Multiple years  FOLLOW-UP APPT SCHEDULED WITH REFERRING PROVIDER: Yes, neurology;  FROM INITIAL EVALUATION SUBJECTIVE:                                                                                                                                                                                          SUBJECTIVE STATEMENT:  Unsteadiness  PERTINENT HISTORY:  Patient with worsening gait and balance. Feels he is dragging his feet. Infrequently using assistive device. Improved RLS since starting gabapentin. Intermittent episodes of vertigo with position changes.  He goes to [employer] for exercise 1-2x/wk. Ongoing tremors in bilateral hands, worse with activity or stress. He reports 4 falls with the most recent about 1 week ago. Pt reports that he was stepping backwards when he fell. He couldn't get up and had to call extended family to help him from the ground. His wife was not strong enough to assist. He denies any significant injury from the fall. He was referred to PT for sensory ataxia and it was recommended he use a walker as well as perform balance exercise.  08/02/2022 MR BRAIN W WO MR ORBITS W WO IMPRESSION:  1. Atrophic right optic nerve which may reflect sequela of prior  optic neuritis.  2. Otherwise unremarkable appearance of the orbits with no acute  finding.  3. No acute intracranial pathology.  4. Moderate background chronic small-vessel ischemic change.   12/30/2019 MR BRAIN WO IMPRESSION:  No evidence of recent infarction, hemorrhage, or mass. Mild progression of parenchymal volume loss and probable chronic microvascular ischemic changes since 2018. Small chronic left pontine infarcts.   06/10/16 CAROTID ARTERY ULTRASOUND  IMPRESSION: 1. Minimal bilateral carotid bifurcation atherosclerotic vascular plaque. No flow limiting stenosis. Degree of stenosis less than 50% bilaterally.  2. Vertebrals are patent with antegrade flow.  06/02/16  MRI BRAIN WO CONTRAST  IMPRESSION: Atrophy. Small vessel disease. Evidence for chronic brainstem lacunar infarction. No acute intracranial findings. Chronic  pansinusitis. Despite prior surgery, significant fluid accumulation and mucosal thickening can be seen, particularly in the RIGHT greater than LEFT frontoethmoid regions.    Pain: No Numbness/Tingling: No, but reports some feet burning; Focal Weakness: No Recent changes in overall health/medication: Yes, adjustment  of meds Prior history of physical therapy for balance:  No Dominant hand: right Imaging: Yes  Red flags: Positive for basal cell carcinoma, Negative for abdominal pain, chills/fever, night sweats, nausea, vomiting, unrelenting pain  PRECAUTIONS: Fall  WEIGHT BEARING RESTRICTIONS: No  FALLS: Has patient fallen in last 6 months? Yes. Number of falls 1, Directional pattern for falls: No  Living Environment Lives with: lives with their spouse Lives in: House/apartment Stairs: one level home, 2 steps to enter from the garage with L rail during ascend.  Has following equipment at home: grab bars in walk-in shower, no seat, rollator (uses periodically)   Prior level of function: Independent  Occupational demands: Retired Nutritional therapist: hunting (I can't get into the stand anymore), previously like to play golf but he can no longer bend over to put the ball on the tee;  Patient Goals: Pt would like to improve his balance   OBJECTIVE:   Patient Surveys  ABC: To be completed  Cognition Patient is oriented to person, place, and time.  Recent memory is intact.  Remote memory is intact.  Attention span and concentration are intact.  Expressive speech is intact.  Patient's fund of knowledge is within normal limits for educational level.    Gross Musculoskeletal Assessment Tremor: BUE tremors observed, worsened with stress; Bulk: Normal Tone: Normal  Posture: Mild forward head and rounded shoulders  LE MMT: MMT (out of 5) Right  Left   Hip flexion 4 4-  Hip extension    Hip abduction (seated) 5 5  Hip adduction (seated) 5 5  Hip internal rotation     Hip external rotation    Knee flexion 5 5  Knee extension 4+ 4+  Ankle dorsiflexion 4+ 4+  Ankle plantarflexion Active Active  (* = pain; Blank rows = not tested)  Sensation Grossly intact to light touch throughout bilateral LEs as determined by testing dermatomes L2-S2. Proprioception, stereognosis, and hot/cold testing deferred on this date.  Reflexes R/L Knee Jerk (L3/4): 2+/2+  Ankle Jerk (S1/2): 2+/2+   Cranial Nerves Deferred  Coordination/Cerebellar Deferred  Transfers: Assistive device utilized: None  Sit to stand: Complete Independence Stand to sit: Complete Independence Chair to chair: Complete Independence Floor: Not assessed  Stairs: Level of Assistance: Complete Independence Stair Negotiation Technique: Alternating Pattern  with Bilateral Rails Number of Stairs: 4  Height of Stairs: 6  Comments: Decreased speed but good stability noted with single or bilateral UE support on rails  Gait: Gait pattern: decreased step length- Right, decreased step length- Left, poor foot clearance- Right, and poor foot clearance- Left Distance walked: 100' Assistive device utilized: None Level of assistance: Complete Independence Comments: Pt ambulates with decreased self-selected speed. Decreased step length bilaterally  BPPV TESTS:  Symptoms Duration Intensity Nystagmus  L Dix-Hallpike None   None  R Dix-Hallpike None   None  L Head Roll None   None  R Head Roll None   None  L Sidelying Test      R Sidelying Test      (blank = not tested)  mCTSIB: 5.4s  Functional Outcome Measures  05/30/23 06/07/23 Comments  BERG 49/56    DGI  17/24   FGA     TUG 19.7 seconds    5TSTS 13.0 seconds    6 Minute Walk Test     10 Meter Gait Speed Self-selected: 15.2s = 0.66 m/s; Fastest: 11.8s = 0.85 m/s    (Blank rows = not tested)  Coordination/Cerebellar Testing Finger  to Nose: Dysmetria BUE but difficult to assess secondary to tremor; Heel to Shin: WNL Rapid alternating  movements: WNL Finger Opposition: WNL Pronator Drift: Negative  Functional Outcome Measures 08/28/23  05/30/23 06/07/23 06/09/23 07/12/23 08/02/23 8/25/254  BERG 49/56   52/56 53/56 48/56   DGI  17/24    18/24  FGA        TUG 19.7 seconds   13.51 seconds 12.0s 10.8s  5TSTS 13.0 seconds   11.30 seconds 10.8s 10.6s  6 Minute Walk Test   916'   856'  10 Meter Gait Speed Self-selected: 15.2s = 0.66 m/s; Fastest: 11.8s = 0.85 m/s   Self-Selected: 13.46= 0.74 m/s; Fastest: 11.13 = 0.90 m/s Self-Selected: 13.7s =  0.73 m/s; Fastest:  10.4s =  0.96 m/s Self-Selected: 13.6s =  0.74 m/s; Fastest:  10.9s =  0.92 m/s  (Blank rows = not tested)   TODAY'S TREATMENT: 08/30/23   SUBJECTIVE: Pt states that he is doing well today.  No falls since the last therapy session and no significant stumbles. Denies resting pain upon arrival today. No specific questions or concerns.   PAIN: Denies   Therapeutic Activity Standing exercises with 5# AW: Hip flexion marches 2 x 15 BLE; Hamstring curls 2 x 15 BLE; Hip abduction 2 x 15 BLE; Hip extension 2 x 15 BLE;  Seated clams with manual resistance from therapist 2 x 15 BLE; Seated adductor squeezes with manual resistance from therapist 2 x 15 BLE; Seated alternating LAQ with 5# AW 2 x 15 BLE;   Neuromuscular Re-education  Airex feet apart eyes open/closed x 30s each; Airex feet apart eyes open with horizontal and vertical head turns x 30s each;   Not performed: Obstacle course in hallway, step ups, balancing, backwards and forwards walking 4 x 50 ft  Cross-body marches with 5# ankle weights (AW) x 3 laps in // bars; Airex balance beam side stepping x multiple laps; Airex balance beam tandem stepping x multiple laps; Airex feet together eyes open/closed x 30s each; Airex feet together eyes open with horizontal and vertical head turns x 30s each; Tandem stance with no UE support, 2 x 30 ea foot forward;  SLS with 2 finger UE support, 3 x 30 ea  foot; MidRow Black TB 2 x 10 x 3 sec scap squeeze; Pallof Press Black TB  2 x 10 x 3 sec scap squeeze hold; Step ups to 6 step 2 x 10 ea LE;  Nautilus lunge chest press 2 x 10 ea LE in front;    PATIENT EDUCATION:  Education details: Outcome measures and goals Person educated: Patient Education method: Explanation, Demonstration, and Verbal cues Education comprehension: verbalized understanding   HOME EXERCISE PROGRAM:  Access Code: [email or web address] URL: [email or web address] Date: 06/07/2023 Prepared by: Selinda Eck  Exercises - Sit to Stand Without Arm Support  - 1 x daily - 7 x weekly - 3 sets - 10 reps - Heel Raises with Counter Support  - 1 x daily - 7 x weekly - 3 sets - 10 reps - Standing Romberg to 1/2 Tandem Stance  - 1 x daily - 7 x weekly - 3 reps - 30s hold  ASSESSMENT:  CLINICAL IMPRESSION: Progressed functional strengthening and balance exercises during session today. Time limited due to late arrival. Plan to progress strengthening at future sessions as well as endurance activities. Pt encouraged to follow-up as scheduled. He would benefit from continued physical therapy services at this time so he can increase his strength, decrease his ambulation  falls risk, and improve his balance so he can participate safely in his environment.   OBJECTIVE IMPAIRMENTS: Abnormal gait, decreased balance, difficulty walking, decreased strength, and dizziness.   ACTIVITY LIMITATIONS: standing, stairs, transfers, and caring for others  PARTICIPATION LIMITATIONS: meal prep, cleaning, laundry, shopping, and community activity  PERSONAL FACTORS: Age, Past/current experiences, Time since onset of injury/illness/exacerbation, and 3+ comorbidities: back pain, tremors, anxiety, depression are also affecting patient's functional outcome.   REHAB POTENTIAL: Good  CLINICAL DECISION MAKING: Unstable/unpredictable  EVALUATION COMPLEXITY: High   GOALS: Goals reviewed with  patient? No  SHORT TERM GOALS: Target date:  Pt will be independent with HEP in order to improve strength and balance in order to decrease fall risk and improve function at home. Baseline:  Goal status: MET   LONG TERM GOALS: Target date: 10/23/2023   Pt will improve ABC by at least 13% in order to demonstrate clinically significant improvement in balance confidence.  Baseline: 62.2%, 07/12/23: 70%; 08/02/23: 45.6%; 08/28/23: 66.6% Goal status: PARTIALLY MET  2.  Pt will improve BERG by at least 3 points in order to demonstrate clinically significant improvement in balance.   Baseline: 49/56; 07/12/23: 52/56; 08/02/23: 53/56; 08/28/23: 48/56; Goal status: ONGOING  3.  Pt will decrease TUG to below 14 seconds/decrease in order to demonstrate decreased fall risk.       Baseline: 19.7s, 07/12/23: 13.51 sec; 08/02/23: 12.0s; 08/28/23: 10.8s Goal status: MET  4. Pt will increase self-selected 27m gait speed to at least 0.80 m/s in order to demonstrate clinically significant improvement in community ambulation.     Baseline: 0.66 m/s, 07/12/23: 0.74 m/s; 08/02/23: 0.73 m/s; 08/28/23: 13.6s =  0.74 m/s; Goal status: PARTIALLY MET  5. Pt will increase by at least 27m (112ft) in order to demonstrate clinically significant improvement in cardiopulmonary endurance and community ambulation      Baseline: 06/09/23: 916'; 08/02/23: Deferred; 08/28/23: 856'; Goal status: ONGOING  PLAN: PT FREQUENCY: 2x/week  PT DURATION: 8 weeks  PLANNED INTERVENTIONS: Therapeutic exercises, Therapeutic activity, Neuromuscular re-education, Balance training, Gait training, Patient/Family education, Self Care, Joint mobilization, Joint manipulation, Vestibular training, Canalith repositioning, Orthotic/Fit training, DME instructions, Dry Needling, Electrical stimulation, Spinal manipulation, Spinal mobilization, Cryotherapy, Moist heat, Taping, Traction, Ultrasound, Ionotophoresis 4mg /ml Dexamethasone , Manual therapy, and  Re-evaluation.  PLAN FOR NEXT SESSION: progress balance and endurance exercises, review/modify HEP as necessary;   Selinda BIRCH Karlis Cregg PT, DPT, GCS  08/30/2023, 11:46 AM

## 2023-08-30 ENCOUNTER — Ambulatory Visit

## 2023-08-30 DIAGNOSIS — M6281 Muscle weakness (generalized): Secondary | ICD-10-CM

## 2023-08-30 DIAGNOSIS — R2681 Unsteadiness on feet: Secondary | ICD-10-CM | POA: Diagnosis not present

## 2023-09-06 ENCOUNTER — Ambulatory Visit: Attending: Internal Medicine

## 2023-09-06 DIAGNOSIS — R2681 Unsteadiness on feet: Secondary | ICD-10-CM | POA: Insufficient documentation

## 2023-09-06 DIAGNOSIS — M6281 Muscle weakness (generalized): Secondary | ICD-10-CM | POA: Insufficient documentation

## 2023-09-06 NOTE — Therapy (Signed)
 OUTPATIENT PHYSICAL THERAPY BALANCE TREATMENT/PROGRESS NOTE  Dates of reporting period  07/12/23   to   09/06/23   Patient Name: Wesley Preston. MRN: [id number] DOB:December 12, 1940, 83 y.o., male Today's Date: 09/06/2023  END OF SESSION:  PT End of Session - 09/06/23 1022     Visit Number 20    Number of Visits 41    Date for PT Re-Evaluation 10/23/23    Authorization Type eval: 05/30/23, [employer] AUTH Approved [id number] visits between 08/17/23-09/14/23    Authorization - Visit Number 3    Authorization - Number of Visits 8    PT Start Time 1018    PT Stop Time 1100    PT Time Calculation (min) 42 min    Equipment Utilized During Treatment Gait belt    Activity Tolerance Patient tolerated treatment well    Behavior During Therapy WFL for tasks assessed/performed         Past Medical History:  Diagnosis Date   Anxiety    Back pain    BPH (benign prostatic hyperplasia)    Cancer (HCC)    BASAL CELL   Chest pain, non-cardiac    Colon polyps    Complication of anesthesia    anesthesia stays a long time   COPD (chronic obstructive pulmonary disease) (HCC)    Depression    Essential tremor    Essential tremor    Family history of adverse reaction to anesthesia    DAUGHTERS GET NAUSEATED   GERD (gastroesophageal reflux disease)    H/O   Helicobacter pylori (H. pylori) infection    Hyperlipidemia    Neuromuscular disorder (HCC)    TREMORS   Reactive airway disease    Reactive airway disease    Restless leg syndrome    Past Surgical History:  Procedure Laterality Date   COLONOSCOPY  2013   COLONOSCOPY WITH PROPOFOL  N/A 03/21/2016   Procedure: COLONOSCOPY WITH PROPOFOL ;  Surgeon: Lamar ONEIDA Holmes, MD;  Location: [facility] ENDOSCOPY;  Service: Endoscopy;  Laterality: N/A;   COLONOSCOPY WITH PROPOFOL  N/A 03/09/2018   Procedure: COLONOSCOPY WITH PROPOFOL ;  Surgeon: Holmes Lamar ONEIDA, MD;  Location: [facility] ENDOSCOPY;  Service: Endoscopy;  Laterality: N/A;   ESOPHAGOGASTRODUODENOSCOPY      ESOPHAGOGASTRODUODENOSCOPY N/A 03/09/2018   Procedure: ESOPHAGOGASTRODUODENOSCOPY (EGD);  Surgeon: Holmes Lamar ONEIDA, MD;  Location: [facility] ENDOSCOPY;  Service: Endoscopy;  Laterality: N/A;   HERNIA REPAIR     HOLEP-LASER ENUCLEATION OF THE PROSTATE WITH MORCELLATION N/A 05/10/2019   Procedure: HOLEP-LASER ENUCLEATION OF THE PROSTATE WITH MORCELLATION;  Surgeon: Francisca Redell BROCKS, MD;  Location: [facility] ORS;  Service: Urology;  Laterality: N/A;   INGUINAL HERNIA REPAIR Right 08/01/2014   Procedure: RIGHT INGUINAL HERNIA  REPAIR WITH MESH ;  Surgeon: Reyes LELON Cota, MD;  Location: [facility] ORS;  Service: General;  Laterality: Right; with large Ultra Pro mesh   NASAL SINUS SURGERY     ROTATOR CUFF REPAIR     TRIGGER FINGER RELEASE     Patient Active Problem List   Diagnosis Date Noted   Postprocedural stricture of urethra at fossa navicularis 07/09/2019   BPH with obstruction/lower urinary tract symptoms 08/21/2018   Restless leg syndrome 07/24/2018   Reactive airway disease 07/24/2018   Pure hypercholesterolemia 07/24/2018   Mechanical back pain 07/24/2018   History of Helicobacter pylori infection 07/24/2018   Essential tremor 07/24/2018   Depression 07/24/2018   BPH associated with nocturia 07/24/2018   Benign essential hypertension 07/24/2018   Anxiety 07/24/2018   Trigger  finger of left thumb 04/14/2017   Hand pain, left 04/14/2017   Visual disturbance 07/18/2016   Tremor 07/18/2016   Imbalance 07/18/2016   Dizziness 07/18/2016   Right inguinal hernia 07/12/2014    PCP: Auston Reyes BIRCH, MD  REFERRING PROVIDER: Auston Reyes BIRCH, MD   REFERRING DIAG:  R26.89 (ICD-10-CM) - Other abnormalities of gait and mobility  R42 (ICD-10-CM) - Dizziness and giddiness   RATIONALE FOR EVALUATION AND TREATMENT: Rehabilitation  THERAPY DIAG: Unsteadiness on feet  Muscle weakness (generalized)  ONSET DATE: Multiple years  FOLLOW-UP APPT SCHEDULED WITH REFERRING PROVIDER: Yes,  neurology;  FROM INITIAL EVALUATION SUBJECTIVE:                                                                                                                                                                                         SUBJECTIVE STATEMENT:  Unsteadiness  PERTINENT HISTORY:  Patient with worsening gait and balance. Feels he is dragging his feet. Infrequently using assistive device. Improved RLS since starting gabapentin. Intermittent episodes of vertigo with position changes.  He goes to [employer] for exercise 1-2x/wk. Ongoing tremors in bilateral hands, worse with activity or stress. He reports 4 falls with the most recent about 1 week ago. Pt reports that he was stepping backwards when he fell. He couldn't get up and had to call extended family to help him from the ground. His wife was not strong enough to assist. He denies any significant injury from the fall. He was referred to PT for sensory ataxia and it was recommended he use a walker as well as perform balance exercise.  08/02/2022 MR BRAIN W WO MR ORBITS W WO IMPRESSION:  1. Atrophic right optic nerve which may reflect sequela of prior  optic neuritis.  2. Otherwise unremarkable appearance of the orbits with no acute  finding.  3. No acute intracranial pathology.  4. Moderate background chronic small-vessel ischemic change.   12/30/2019 MR BRAIN WO IMPRESSION:  No evidence of recent infarction, hemorrhage, or mass. Mild progression of parenchymal volume loss and probable chronic microvascular ischemic changes since 2018. Small chronic left pontine infarcts.   06/10/16 CAROTID ARTERY ULTRASOUND  IMPRESSION: 1. Minimal bilateral carotid bifurcation atherosclerotic vascular plaque. No flow limiting stenosis. Degree of stenosis less than 50% bilaterally.  2. Vertebrals are patent with antegrade flow.  06/02/16  MRI BRAIN WO CONTRAST  IMPRESSION: Atrophy. Small vessel disease. Evidence for chronic brainstem  lacunar infarction. No acute intracranial findings. Chronic pansinusitis. Despite prior surgery, significant fluid accumulation and mucosal thickening can be seen, particularly in the RIGHT greater than LEFT frontoethmoid regions.    Pain: No Numbness/Tingling: No,  but reports some feet burning; Focal Weakness: No Recent changes in overall health/medication: Yes, adjustment of meds Prior history of physical therapy for balance:  No Dominant hand: right Imaging: Yes  Red flags: Positive for basal cell carcinoma, Negative for abdominal pain, chills/fever, night sweats, nausea, vomiting, unrelenting pain  PRECAUTIONS: Fall  WEIGHT BEARING RESTRICTIONS: No  FALLS: Has patient fallen in last 6 months? Yes. Number of falls 1, Directional pattern for falls: No  Living Environment Lives with: lives with their spouse Lives in: House/apartment Stairs: one level home, 2 steps to enter from the garage with L rail during ascend.  Has following equipment at home: grab bars in walk-in shower, no seat, rollator (uses periodically)   Prior level of function: Independent  Occupational demands: Retired Nutritional therapist: hunting (I can't get into the stand anymore), previously like to play golf but he can no longer bend over to put the ball on the tee;  Patient Goals: Pt would like to improve his balance   OBJECTIVE:   Patient Surveys  ABC: To be completed  Cognition Patient is oriented to person, place, and time.  Recent memory is intact.  Remote memory is intact.  Attention span and concentration are intact.  Expressive speech is intact.  Patient's fund of knowledge is within normal limits for educational level.    Gross Musculoskeletal Assessment Tremor: BUE tremors observed, worsened with stress; Bulk: Normal Tone: Normal  Posture: Mild forward head and rounded shoulders  LE MMT: MMT (out of 5) Right  Left   Hip flexion 4 4-  Hip extension    Hip abduction (seated)  5 5  Hip adduction (seated) 5 5  Hip internal rotation    Hip external rotation    Knee flexion 5 5  Knee extension 4+ 4+  Ankle dorsiflexion 4+ 4+  Ankle plantarflexion Active Active  (* = pain; Blank rows = not tested)  Sensation Grossly intact to light touch throughout bilateral LEs as determined by testing dermatomes L2-S2. Proprioception, stereognosis, and hot/cold testing deferred on this date.  Reflexes R/L Knee Jerk (L3/4): 2+/2+  Ankle Jerk (S1/2): 2+/2+   Cranial Nerves Deferred  Coordination/Cerebellar Deferred  Transfers: Assistive device utilized: None  Sit to stand: Complete Independence Stand to sit: Complete Independence Chair to chair: Complete Independence Floor: Not assessed  Stairs: Level of Assistance: Complete Independence Stair Negotiation Technique: Alternating Pattern  with Bilateral Rails Number of Stairs: 4  Height of Stairs: 6  Comments: Decreased speed but good stability noted with single or bilateral UE support on rails  Gait: Gait pattern: decreased step length- Right, decreased step length- Left, poor foot clearance- Right, and poor foot clearance- Left Distance walked: 100' Assistive device utilized: None Level of assistance: Complete Independence Comments: Pt ambulates with decreased self-selected speed. Decreased step length bilaterally  BPPV TESTS:  Symptoms Duration Intensity Nystagmus  L Dix-Hallpike None   None  R Dix-Hallpike None   None  L Head Roll None   None  R Head Roll None   None  L Sidelying Test      R Sidelying Test      (blank = not tested)  mCTSIB: 5.4s  Functional Outcome Measures  05/30/23 06/07/23 Comments  BERG 49/56    DGI  17/24   FGA     TUG 19.7 seconds    5TSTS 13.0 seconds    6 Minute Walk Test     10 Meter Gait Speed Self-selected: 15.2s = 0.66 m/s; Fastest: 11.8s =  0.85 m/s    (Blank rows = not tested)  Coordination/Cerebellar Testing Finger to Nose: Dysmetria BUE but difficult to assess  secondary to tremor; Heel to Shin: WNL Rapid alternating movements: WNL Finger Opposition: WNL Pronator Drift: Negative  Functional Outcome Measures 08/28/23  05/30/23 06/07/23 06/09/23 07/12/23 08/02/23 8/25/254  BERG 49/56   52/56 53/56 48/56   DGI  17/24    18/24  FGA        TUG 19.7 seconds   13.51 seconds 12.0s 10.8s  5TSTS 13.0 seconds   11.30 seconds 10.8s 10.6s  6 Minute Walk Test   916'   856'  10 Meter Gait Speed Self-selected: 15.2s = 0.66 m/s; Fastest: 11.8s = 0.85 m/s   Self-Selected: 13.46= 0.74 m/s; Fastest: 11.13 = 0.90 m/s Self-Selected: 13.7s =  0.73 m/s; Fastest:  10.4s =  0.96 m/s Self-Selected: 13.6s =  0.74 m/s; Fastest:  10.9s =  0.92 m/s  (Blank rows = not tested)   TODAY'S TREATMENT: 08/30/23   SUBJECTIVE: Pt states that he is doing well today.  No falls since the last therapy session and no significant stumbles. Denies resting pain upon arrival today. No specific questions or concerns.   PAIN: Denies   Therapeutic Activity NuStep L1-4 x 10 minutes for BLE strengthening during interval history with therapist monitoring and adjusting resistance continually throughout;  Standing exercises with 5# AW: Hip flexion marches 2 x 20 BLE; Hamstring curls 2 x 20 BLE; Hip abduction 2 x 20 BLE; Hip extension 2 x 20 BLE;  Seated clams with manual resistance from therapist 2 x 15 BLE; Seated adductor squeezes with manual resistance from therapist 2 x 15 BLE; Seated alternating LAQ with 5# AW 2 x 15 BLE;   Neuromuscular Re-education  Airex alternating 6 and 12 step taps x 10 each BLE; Airex feet together ball passes around body with therapist x multiple bouts each direction; Sit to stand from regular height chair standing on Airex pad to challenge dynamic balance 2 x 10;   Not performed: Obstacle course in hallway, step ups, balancing, backwards and forwards walking 4 x 50 ft  Cross-body marches with 5# ankle weights (AW) x 3 laps in // bars; Airex balance beam  side stepping x multiple laps; Airex balance beam tandem stepping x multiple laps; Airex feet together eyes open/closed x 30s each; Airex feet together eyes open with horizontal and vertical head turns x 30s each; Tandem stance with no UE support, 2 x 30 ea foot forward;  SLS with 2 finger UE support, 3 x 30 ea foot; MidRow Black TB 2 x 10 x 3 sec scap squeeze; Pallof Press Black TB  2 x 10 x 3 sec scap squeeze hold; Step ups to 6 step 2 x 10 ea LE;  Nautilus lunge chest press 2 x 10 ea LE in front;    PATIENT EDUCATION:  Education details: Outcome measures and goals Person educated: Patient Education method: Explanation, Demonstration, and Verbal cues Education comprehension: verbalized understanding   HOME EXERCISE PROGRAM:  Access Code: [email or web address] URL: [email or web address] Date: 06/07/2023 Prepared by: Selinda Eck  Exercises - Sit to Stand Without Arm Support  - 1 x daily - 7 x weekly - 3 sets - 10 reps - Heel Raises with Counter Support  - 1 x daily - 7 x weekly - 3 sets - 10 reps - Standing Romberg to 1/2 Tandem Stance  - 1 x daily - 7 x weekly - 3 reps - 30s hold  ASSESSMENT:  CLINICAL IMPRESSION: Updated goals and outcome measures with pt during visit on 08/28/23. BERG score had previously improved however since his most recent fall he regressed back to 48/56. His TUG score continueD to show improvement from 19.7 seconds initially to 10.8 seconds. He made good progress towards his goal of improving his ABC score since the initial evaluation but is still not above the cut-off score. His self-selected pace improved initially but has remained relatively consistent since that time. This speed is still below necessary relevant speed for community mobility. was slightly less than the prior update. Continued with balance and functional strengthening during session today. Plan for future sessions is to progress balance and endurance activities. Pt  encouraged to follow-up as scheduled. He would benefit from continued physical therapy services at this time so he can increase his strength, decrease his ambulation falls risk, and improve his balance so he can participate safely in his environment.   OBJECTIVE IMPAIRMENTS: Abnormal gait, decreased balance, difficulty walking, decreased strength, and dizziness.   ACTIVITY LIMITATIONS: standing, stairs, transfers, and caring for others  PARTICIPATION LIMITATIONS: meal prep, cleaning, laundry, shopping, and community activity  PERSONAL FACTORS: Age, Past/current experiences, Time since onset of injury/illness/exacerbation, and 3+ comorbidities: back pain, tremors, anxiety, depression are also affecting patient's functional outcome.   REHAB POTENTIAL: Good  CLINICAL DECISION MAKING: Unstable/unpredictable  EVALUATION COMPLEXITY: High   GOALS: Goals reviewed with patient? No  SHORT TERM GOALS: Target date:  Pt will be independent with HEP in order to improve strength and balance in order to decrease fall risk and improve function at home. Baseline:  Goal status: MET   LONG TERM GOALS: Target date: 10/23/2023   Pt will improve ABC by at least 13% in order to demonstrate clinically significant improvement in balance confidence.  Baseline: 62.2%, 07/12/23: 70%; 08/02/23: 45.6%; 08/28/23: 66.6% Goal status: PARTIALLY MET  2.  Pt will improve BERG by at least 3 points in order to demonstrate clinically significant improvement in balance.   Baseline: 49/56; 07/12/23: 52/56; 08/02/23: 53/56; 08/28/23: 48/56; Goal status: ONGOING  3.  Pt will decrease TUG to below 14 seconds/decrease in order to demonstrate decreased fall risk.       Baseline: 19.7s, 07/12/23: 13.51 sec; 08/02/23: 12.0s; 08/28/23: 10.8s Goal status: MET  4. Pt will increase self-selected 44m gait speed to at least 0.80 m/s in order to demonstrate clinically significant improvement in community ambulation.     Baseline: 0.66 m/s,  07/12/23: 0.74 m/s; 08/02/23: 0.73 m/s; 08/28/23: 13.6s =  0.74 m/s; Goal status: PARTIALLY MET  5. Pt will increase by at least 25m (116ft) in order to demonstrate clinically significant improvement in cardiopulmonary endurance and community ambulation      Baseline: 06/09/23: 916'; 08/02/23: Deferred; 08/28/23: 856'; Goal status: ONGOING  PLAN: PT FREQUENCY: 2x/week  PT DURATION: 8 weeks  PLANNED INTERVENTIONS: Therapeutic exercises, Therapeutic activity, Neuromuscular re-education, Balance training, Gait training, Patient/Family education, Self Care, Joint mobilization, Joint manipulation, Vestibular training, Canalith repositioning, Orthotic/Fit training, DME instructions, Dry Needling, Electrical stimulation, Spinal manipulation, Spinal mobilization, Cryotherapy, Moist heat, Taping, Traction, Ultrasound, Ionotophoresis 4mg /ml Dexamethasone , Manual therapy, and Re-evaluation.  PLAN FOR NEXT SESSION: progress balance and endurance exercises, review/modify HEP as necessary;   Selinda BIRCH Tauheedah Bok PT, DPT, GCS  09/06/2023, 12:33 PM

## 2023-09-08 ENCOUNTER — Ambulatory Visit

## 2023-09-08 DIAGNOSIS — M6281 Muscle weakness (generalized): Secondary | ICD-10-CM

## 2023-09-08 DIAGNOSIS — R2681 Unsteadiness on feet: Secondary | ICD-10-CM | POA: Diagnosis not present

## 2023-09-08 NOTE — Therapy (Signed)
 OUTPATIENT PHYSICAL THERAPY BALANCE TREATMENT  Patient Name: Wesley Preston. MRN: [id number] DOB:02-26-1940, 83 y.o., male Today's Date: 09/08/2023  END OF SESSION:  PT End of Session - 09/08/23 1102     Visit Number 21    Number of Visits 41    Date for PT Re-Evaluation 10/23/23    Authorization Type eval: 05/30/23, [employer] AUTH Approved [id number] visits between 08/17/23-09/14/23    Authorization - Visit Number 4    Authorization - Number of Visits 8    PT Start Time 1020    PT Stop Time 1102    PT Time Calculation (min) 42 min    Equipment Utilized During Treatment Gait belt    Activity Tolerance Patient tolerated treatment well    Behavior During Therapy WFL for tasks assessed/performed          Past Medical History:  Diagnosis Date   Anxiety    Back pain    BPH (benign prostatic hyperplasia)    Cancer (HCC)    BASAL CELL   Chest pain, non-cardiac    Colon polyps    Complication of anesthesia    anesthesia stays a long time   COPD (chronic obstructive pulmonary disease) (HCC)    Depression    Essential tremor    Essential tremor    Family history of adverse reaction to anesthesia    DAUGHTERS GET NAUSEATED   GERD (gastroesophageal reflux disease)    H/O   Helicobacter pylori (H. pylori) infection    Hyperlipidemia    Neuromuscular disorder (HCC)    TREMORS   Reactive airway disease    Reactive airway disease    Restless leg syndrome    Past Surgical History:  Procedure Laterality Date   COLONOSCOPY  2013   COLONOSCOPY WITH PROPOFOL  N/A 03/21/2016   Procedure: COLONOSCOPY WITH PROPOFOL ;  Surgeon: Lamar ONEIDA Holmes, MD;  Location: [facility] ENDOSCOPY;  Service: Endoscopy;  Laterality: N/A;   COLONOSCOPY WITH PROPOFOL  N/A 03/09/2018   Procedure: COLONOSCOPY WITH PROPOFOL ;  Surgeon: Holmes Lamar ONEIDA, MD;  Location: [facility] ENDOSCOPY;  Service: Endoscopy;  Laterality: N/A;   ESOPHAGOGASTRODUODENOSCOPY     ESOPHAGOGASTRODUODENOSCOPY N/A 03/09/2018   Procedure:  ESOPHAGOGASTRODUODENOSCOPY (EGD);  Surgeon: Holmes Lamar ONEIDA, MD;  Location: [facility] ENDOSCOPY;  Service: Endoscopy;  Laterality: N/A;   HERNIA REPAIR     HOLEP-LASER ENUCLEATION OF THE PROSTATE WITH MORCELLATION N/A 05/10/2019   Procedure: HOLEP-LASER ENUCLEATION OF THE PROSTATE WITH MORCELLATION;  Surgeon: Francisca Redell BROCKS, MD;  Location: [facility] ORS;  Service: Urology;  Laterality: N/A;   INGUINAL HERNIA REPAIR Right 08/01/2014   Procedure: RIGHT INGUINAL HERNIA  REPAIR WITH MESH ;  Surgeon: Reyes LELON Cota, MD;  Location: [facility] ORS;  Service: General;  Laterality: Right; with large Ultra Pro mesh   NASAL SINUS SURGERY     ROTATOR CUFF REPAIR     TRIGGER FINGER RELEASE     Patient Active Problem List   Diagnosis Date Noted   Postprocedural stricture of urethra at fossa navicularis 07/09/2019   BPH with obstruction/lower urinary tract symptoms 08/21/2018   Restless leg syndrome 07/24/2018   Reactive airway disease 07/24/2018   Pure hypercholesterolemia 07/24/2018   Mechanical back pain 07/24/2018   History of Helicobacter pylori infection 07/24/2018   Essential tremor 07/24/2018   Depression 07/24/2018   BPH associated with nocturia 07/24/2018   Benign essential hypertension 07/24/2018   Anxiety 07/24/2018   Trigger finger of left thumb 04/14/2017   Hand pain, left 04/14/2017   Visual  disturbance 07/18/2016   Tremor 07/18/2016   Imbalance 07/18/2016   Dizziness 07/18/2016   Right inguinal hernia 07/12/2014    PCP: Auston Reyes BIRCH, MD  REFERRING PROVIDER: Auston Reyes BIRCH, MD   REFERRING DIAG:  R26.89 (ICD-10-CM) - Other abnormalities of gait and mobility  R42 (ICD-10-CM) - Dizziness and giddiness   RATIONALE FOR EVALUATION AND TREATMENT: Rehabilitation  THERAPY DIAG: Unsteadiness on feet  Muscle weakness (generalized)  ONSET DATE: Multiple years  FOLLOW-UP APPT SCHEDULED WITH REFERRING PROVIDER: Yes, neurology;  FROM INITIAL EVALUATION SUBJECTIVE:                                                                                                                                                                                          SUBJECTIVE STATEMENT:  Unsteadiness  PERTINENT HISTORY:  Patient with worsening gait and balance. Feels he is dragging his feet. Infrequently using assistive device. Improved RLS since starting gabapentin. Intermittent episodes of vertigo with position changes.  He goes to [employer] for exercise 1-2x/wk. Ongoing tremors in bilateral hands, worse with activity or stress. He reports 4 falls with the most recent about 1 week ago. Pt reports that he was stepping backwards when he fell. He couldn't get up and had to call extended family to help him from the ground. His wife was not strong enough to assist. He denies any significant injury from the fall. He was referred to PT for sensory ataxia and it was recommended he use a walker as well as perform balance exercise.  08/02/2022 MR BRAIN W WO MR ORBITS W WO IMPRESSION:  1. Atrophic right optic nerve which may reflect sequela of prior  optic neuritis.  2. Otherwise unremarkable appearance of the orbits with no acute  finding.  3. No acute intracranial pathology.  4. Moderate background chronic small-vessel ischemic change.   12/30/2019 MR BRAIN WO IMPRESSION:  No evidence of recent infarction, hemorrhage, or mass. Mild progression of parenchymal volume loss and probable chronic microvascular ischemic changes since 2018. Small chronic left pontine infarcts.   06/10/16 CAROTID ARTERY ULTRASOUND  IMPRESSION: 1. Minimal bilateral carotid bifurcation atherosclerotic vascular plaque. No flow limiting stenosis. Degree of stenosis less than 50% bilaterally.  2. Vertebrals are patent with antegrade flow.  06/02/16  MRI BRAIN WO CONTRAST  IMPRESSION: Atrophy. Small vessel disease. Evidence for chronic brainstem lacunar infarction. No acute intracranial findings. Chronic  pansinusitis. Despite prior surgery, significant fluid accumulation and mucosal thickening can be seen, particularly in the RIGHT greater than LEFT frontoethmoid regions.    Pain: No Numbness/Tingling: No, but reports some feet burning; Focal Weakness: No Recent changes in overall health/medication: Yes,  adjustment of meds Prior history of physical therapy for balance:  No Dominant hand: right Imaging: Yes  Red flags: Positive for basal cell carcinoma, Negative for abdominal pain, chills/fever, night sweats, nausea, vomiting, unrelenting pain  PRECAUTIONS: Fall  WEIGHT BEARING RESTRICTIONS: No  FALLS: Has patient fallen in last 6 months? Yes. Number of falls 1, Directional pattern for falls: No  Living Environment Lives with: lives with their spouse Lives in: House/apartment Stairs: one level home, 2 steps to enter from the garage with L rail during ascend.  Has following equipment at home: grab bars in walk-in shower, no seat, rollator (uses periodically)   Prior level of function: Independent  Occupational demands: Retired Nutritional therapist: hunting (I can't get into the stand anymore), previously like to play golf but he can no longer bend over to put the ball on the tee;  Patient Goals: Pt would like to improve his balance   OBJECTIVE:   Patient Surveys  ABC: To be completed  Cognition Patient is oriented to person, place, and time.  Recent memory is intact.  Remote memory is intact.  Attention span and concentration are intact.  Expressive speech is intact.  Patient's fund of knowledge is within normal limits for educational level.    Gross Musculoskeletal Assessment Tremor: BUE tremors observed, worsened with stress; Bulk: Normal Tone: Normal  Posture: Mild forward head and rounded shoulders  LE MMT: MMT (out of 5) Right  Left   Hip flexion 4 4-  Hip extension    Hip abduction (seated) 5 5  Hip adduction (seated) 5 5  Hip internal rotation     Hip external rotation    Knee flexion 5 5  Knee extension 4+ 4+  Ankle dorsiflexion 4+ 4+  Ankle plantarflexion Active Active  (* = pain; Blank rows = not tested)  Sensation Grossly intact to light touch throughout bilateral LEs as determined by testing dermatomes L2-S2. Proprioception, stereognosis, and hot/cold testing deferred on this date.  Reflexes R/L Knee Jerk (L3/4): 2+/2+  Ankle Jerk (S1/2): 2+/2+   Cranial Nerves Deferred  Coordination/Cerebellar Deferred  Transfers: Assistive device utilized: None  Sit to stand: Complete Independence Stand to sit: Complete Independence Chair to chair: Complete Independence Floor: Not assessed  Stairs: Level of Assistance: Complete Independence Stair Negotiation Technique: Alternating Pattern  with Bilateral Rails Number of Stairs: 4  Height of Stairs: 6  Comments: Decreased speed but good stability noted with single or bilateral UE support on rails  Gait: Gait pattern: decreased step length- Right, decreased step length- Left, poor foot clearance- Right, and poor foot clearance- Left Distance walked: 100' Assistive device utilized: None Level of assistance: Complete Independence Comments: Pt ambulates with decreased self-selected speed. Decreased step length bilaterally  BPPV TESTS:  Symptoms Duration Intensity Nystagmus  L Dix-Hallpike None   None  R Dix-Hallpike None   None  L Head Roll None   None  R Head Roll None   None  L Sidelying Test      R Sidelying Test      (blank = not tested)  mCTSIB: 5.4s  Functional Outcome Measures  05/30/23 06/07/23 Comments  BERG 49/56    DGI  17/24   FGA     TUG 19.7 seconds    5TSTS 13.0 seconds    6 Minute Walk Test     10 Meter Gait Speed Self-selected: 15.2s = 0.66 m/s; Fastest: 11.8s = 0.85 m/s    (Blank rows = not tested)  Coordination/Cerebellar Testing  Finger to Nose: Dysmetria BUE but difficult to assess secondary to tremor; Heel to Shin: WNL Rapid alternating  movements: WNL Finger Opposition: WNL Pronator Drift: Negative  Functional Outcome Measures 08/28/23  05/30/23 06/07/23 06/09/23 07/12/23 08/02/23 8/25/254  BERG 49/56   52/56 53/56 48/56   DGI  17/24    18/24  FGA        TUG 19.7 seconds   13.51 seconds 12.0s 10.8s  5TSTS 13.0 seconds   11.30 seconds 10.8s 10.6s  6 Minute Walk Test   916'   856'  10 Meter Gait Speed Self-selected: 15.2s = 0.66 m/s; Fastest: 11.8s = 0.85 m/s   Self-Selected: 13.46= 0.74 m/s; Fastest: 11.13 = 0.90 m/s Self-Selected: 13.7s =  0.73 m/s; Fastest:  10.4s =  0.96 m/s Self-Selected: 13.6s =  0.74 m/s; Fastest:  10.9s =  0.92 m/s  (Blank rows = not tested)   TODAY'S TREATMENT: 09/08/23   SUBJECTIVE: Pt states that he is doing well today.  No falls since the last therapy session and no significant stumbles. Denies resting pain upon arrival today. No specific questions or concerns.   PAIN: Denies   Therapeutic Activity NuStep L1-5 (seat 10, arms 10) x 10 minutes for BLE strengthening during interval history with therapist monitoring and adjusting resistance continually throughout; Sit to stand for speed 2 x 10; Standing heel raises x 15;  Standing exercises with 7.5# AW: Hip flexion marches x 15 BLE; Hamstring curls x 15 BLE; Hip abduction x 15 BLE; Hip extension x 15 BLE;  Seated clams with manual resistance from therapist x 15 BLE; Seated adductor squeezes with manual resistance from therapist x 15 BLE; Seated alternating LAQ with 7.5# AW x 15 BLE;   Neuromuscular Re-education  6 and 12 step taps x 10 each BLE; Airex alternating 6 and 12 step taps x 10 each BLE; Step ups to 6 steps x 10 BLE;    Not performed: Obstacle course in hallway, step ups, balancing, backwards and forwards walking 4 x 50 ft  Cross-body marches with 5# ankle weights (AW) x 3 laps in // bars; Airex balance beam side stepping x multiple laps; Airex balance beam tandem stepping x multiple laps; Airex feet together eyes  open/closed x 30s each; Airex feet together eyes open with horizontal and vertical head turns x 30s each; Tandem stance with no UE support, 2 x 30 ea foot forward;  SLS with 2 finger UE support, 3 x 30 ea foot; MidRow Black TB 2 x 10 x 3 sec scap squeeze; Pallof Press Black TB  2 x 10 x 3 sec scap squeeze hold; Nautilus lunge chest press 2 x 10 ea LE in front;  Airex feet together ball passes around body with therapist x multiple bouts each direction;   PATIENT EDUCATION:  Education details: Outcome measures and goals Person educated: Patient Education method: Explanation, Demonstration, and Verbal cues Education comprehension: verbalized understanding   HOME EXERCISE PROGRAM:  Access Code: [email or web address] URL: [email or web address] Date: 06/07/2023 Prepared by: Selinda Eck  Exercises - Sit to Stand Without Arm Support  - 1 x daily - 7 x weekly - 3 sets - 10 reps - Heel Raises with Counter Support  - 1 x daily - 7 x weekly - 3 sets - 10 reps - Standing Romberg to 1/2 Tandem Stance  - 1 x daily - 7 x weekly - 3 reps - 30s hold  ASSESSMENT:  CLINICAL IMPRESSION: Progressed functional strengthening and balance exercises during session today. Increased ankle weights  to 7.5#. Plan to progress strengthening at future sessions as well as endurance activities. Pt encouraged to follow-up as scheduled. He would benefit from continued physical therapy services at this time so he can increase his strength, decrease his ambulation falls risk, and improve his balance so he can participate safely in his environment.   OBJECTIVE IMPAIRMENTS: Abnormal gait, decreased balance, difficulty walking, decreased strength, and dizziness.   ACTIVITY LIMITATIONS: standing, stairs, transfers, and caring for others  PARTICIPATION LIMITATIONS: meal prep, cleaning, laundry, shopping, and community activity  PERSONAL FACTORS: Age, Past/current experiences, Time since onset of  injury/illness/exacerbation, and 3+ comorbidities: back pain, tremors, anxiety, depression are also affecting patient's functional outcome.   REHAB POTENTIAL: Good  CLINICAL DECISION MAKING: Unstable/unpredictable  EVALUATION COMPLEXITY: High   GOALS: Goals reviewed with patient? No  SHORT TERM GOALS: Target date:  Pt will be independent with HEP in order to improve strength and balance in order to decrease fall risk and improve function at home. Baseline:  Goal status: MET   LONG TERM GOALS: Target date: 10/23/2023   Pt will improve ABC by at least 13% in order to demonstrate clinically significant improvement in balance confidence.  Baseline: 62.2%, 07/12/23: 70%; 08/02/23: 45.6%; 08/28/23: 66.6% Goal status: PARTIALLY MET  2.  Pt will improve BERG by at least 3 points in order to demonstrate clinically significant improvement in balance.   Baseline: 49/56; 07/12/23: 52/56; 08/02/23: 53/56; 08/28/23: 48/56; Goal status: ONGOING  3.  Pt will decrease TUG to below 14 seconds/decrease in order to demonstrate decreased fall risk.       Baseline: 19.7s, 07/12/23: 13.51 sec; 08/02/23: 12.0s; 08/28/23: 10.8s Goal status: MET  4. Pt will increase self-selected 25m gait speed to at least 0.80 m/s in order to demonstrate clinically significant improvement in community ambulation.     Baseline: 0.66 m/s, 07/12/23: 0.74 m/s; 08/02/23: 0.73 m/s; 08/28/23: 13.6s =  0.74 m/s; Goal status: PARTIALLY MET  5. Pt will increase by at least 60m (162ft) in order to demonstrate clinically significant improvement in cardiopulmonary endurance and community ambulation      Baseline: 06/09/23: 916'; 08/02/23: Deferred; 08/28/23: 856'; Goal status: ONGOING  PLAN: PT FREQUENCY: 2x/week  PT DURATION: 8 weeks  PLANNED INTERVENTIONS: Therapeutic exercises, Therapeutic activity, Neuromuscular re-education, Balance training, Gait training, Patient/Family education, Self Care, Joint mobilization, Joint manipulation,  Vestibular training, Canalith repositioning, Orthotic/Fit training, DME instructions, Dry Needling, Electrical stimulation, Spinal manipulation, Spinal mobilization, Cryotherapy, Moist heat, Taping, Traction, Ultrasound, Ionotophoresis 4mg /ml Dexamethasone , Manual therapy, and Re-evaluation.  PLAN FOR NEXT SESSION: progress balance and endurance exercises, review/modify HEP as necessary;   Jaylene Schrom D Anupama Piehl PT, DPT, GCS  09/08/2023, 11:07 AM

## 2023-09-12 ENCOUNTER — Ambulatory Visit

## 2023-09-12 DIAGNOSIS — M6281 Muscle weakness (generalized): Secondary | ICD-10-CM

## 2023-09-12 DIAGNOSIS — R2681 Unsteadiness on feet: Secondary | ICD-10-CM | POA: Diagnosis not present

## 2023-09-12 NOTE — Therapy (Unsigned)
 OUTPATIENT PHYSICAL THERAPY BALANCE TREATMENT  Patient Name: Wesley Preston. MRN: [id number] DOB:1940-08-17, 83 y.o., male Today's Date: 09/12/2023  END OF SESSION:  PT End of Session - 09/12/23 1706     Visit Number 22    Number of Visits 41    Date for PT Re-Evaluation 10/23/23    Authorization Type eval: 05/30/23, [employer] AUTH Approved [id number] visits between 08/17/23-09/14/23    Authorization - Visit Number 5    Authorization - Number of Visits 8    PT Start Time 1705    PT Stop Time 1745    PT Time Calculation (min) 40 min    Equipment Utilized During Treatment Gait belt    Activity Tolerance Patient tolerated treatment well    Behavior During Therapy WFL for tasks assessed/performed          Past Medical History:  Diagnosis Date   Anxiety    Back pain    BPH (benign prostatic hyperplasia)    Cancer (HCC)    BASAL CELL   Chest pain, non-cardiac    Colon polyps    Complication of anesthesia    anesthesia stays a long time   COPD (chronic obstructive pulmonary disease) (HCC)    Depression    Essential tremor    Essential tremor    Family history of adverse reaction to anesthesia    DAUGHTERS GET NAUSEATED   GERD (gastroesophageal reflux disease)    H/O   Helicobacter pylori (H. pylori) infection    Hyperlipidemia    Neuromuscular disorder (HCC)    TREMORS   Reactive airway disease    Reactive airway disease    Restless leg syndrome    Past Surgical History:  Procedure Laterality Date   COLONOSCOPY  2013   COLONOSCOPY WITH PROPOFOL  N/A 03/21/2016   Procedure: COLONOSCOPY WITH PROPOFOL ;  Surgeon: Lamar ONEIDA Holmes, MD;  Location: [facility] ENDOSCOPY;  Service: Endoscopy;  Laterality: N/A;   COLONOSCOPY WITH PROPOFOL  N/A 03/09/2018   Procedure: COLONOSCOPY WITH PROPOFOL ;  Surgeon: Holmes Lamar ONEIDA, MD;  Location: [facility] ENDOSCOPY;  Service: Endoscopy;  Laterality: N/A;   ESOPHAGOGASTRODUODENOSCOPY     ESOPHAGOGASTRODUODENOSCOPY N/A 03/09/2018   Procedure:  ESOPHAGOGASTRODUODENOSCOPY (EGD);  Surgeon: Holmes Lamar ONEIDA, MD;  Location: [facility] ENDOSCOPY;  Service: Endoscopy;  Laterality: N/A;   HERNIA REPAIR     HOLEP-LASER ENUCLEATION OF THE PROSTATE WITH MORCELLATION N/A 05/10/2019   Procedure: HOLEP-LASER ENUCLEATION OF THE PROSTATE WITH MORCELLATION;  Surgeon: Francisca Redell BROCKS, MD;  Location: [facility] ORS;  Service: Urology;  Laterality: N/A;   INGUINAL HERNIA REPAIR Right 08/01/2014   Procedure: RIGHT INGUINAL HERNIA  REPAIR WITH MESH ;  Surgeon: Reyes LELON Cota, MD;  Location: [facility] ORS;  Service: General;  Laterality: Right; with large Ultra Pro mesh   NASAL SINUS SURGERY     ROTATOR CUFF REPAIR     TRIGGER FINGER RELEASE     Patient Active Problem List   Diagnosis Date Noted   Postprocedural stricture of urethra at fossa navicularis 07/09/2019   BPH with obstruction/lower urinary tract symptoms 08/21/2018   Restless leg syndrome 07/24/2018   Reactive airway disease 07/24/2018   Pure hypercholesterolemia 07/24/2018   Mechanical back pain 07/24/2018   History of Helicobacter pylori infection 07/24/2018   Essential tremor 07/24/2018   Depression 07/24/2018   BPH associated with nocturia 07/24/2018   Benign essential hypertension 07/24/2018   Anxiety 07/24/2018   Trigger finger of left thumb 04/14/2017   Hand pain, left 04/14/2017   Visual  disturbance 07/18/2016   Tremor 07/18/2016   Imbalance 07/18/2016   Dizziness 07/18/2016   Right inguinal hernia 07/12/2014    PCP: Auston Reyes BIRCH, MD  REFERRING PROVIDER: Auston Reyes BIRCH, MD   REFERRING DIAG:  R26.89 (ICD-10-CM) - Other abnormalities of gait and mobility  R42 (ICD-10-CM) - Dizziness and giddiness   RATIONALE FOR EVALUATION AND TREATMENT: Rehabilitation  THERAPY DIAG: Unsteadiness on feet  Muscle weakness (generalized)  ONSET DATE: Multiple years  FOLLOW-UP APPT SCHEDULED WITH REFERRING PROVIDER: Yes, neurology;  FROM INITIAL EVALUATION SUBJECTIVE:                                                                                                                                                                                          SUBJECTIVE STATEMENT:  Unsteadiness  PERTINENT HISTORY:  Patient with worsening gait and balance. Feels he is dragging his feet. Infrequently using assistive device. Improved RLS since starting gabapentin. Intermittent episodes of vertigo with position changes.  He goes to [employer] for exercise 1-2x/wk. Ongoing tremors in bilateral hands, worse with activity or stress. He reports 4 falls with the most recent about 1 week ago. Pt reports that he was stepping backwards when he fell. He couldn't get up and had to call extended family to help him from the ground. His wife was not strong enough to assist. He denies any significant injury from the fall. He was referred to PT for sensory ataxia and it was recommended he use a walker as well as perform balance exercise.  08/02/2022 MR BRAIN W WO MR ORBITS W WO IMPRESSION:  1. Atrophic right optic nerve which may reflect sequela of prior  optic neuritis.  2. Otherwise unremarkable appearance of the orbits with no acute  finding.  3. No acute intracranial pathology.  4. Moderate background chronic small-vessel ischemic change.   12/30/2019 MR BRAIN WO IMPRESSION:  No evidence of recent infarction, hemorrhage, or mass. Mild progression of parenchymal volume loss and probable chronic microvascular ischemic changes since 2018. Small chronic left pontine infarcts.   06/10/16 CAROTID ARTERY ULTRASOUND  IMPRESSION: 1. Minimal bilateral carotid bifurcation atherosclerotic vascular plaque. No flow limiting stenosis. Degree of stenosis less than 50% bilaterally.  2. Vertebrals are patent with antegrade flow.  06/02/16  MRI BRAIN WO CONTRAST  IMPRESSION: Atrophy. Small vessel disease. Evidence for chronic brainstem lacunar infarction. No acute intracranial findings. Chronic  pansinusitis. Despite prior surgery, significant fluid accumulation and mucosal thickening can be seen, particularly in the RIGHT greater than LEFT frontoethmoid regions.    Pain: No Numbness/Tingling: No, but reports some feet burning; Focal Weakness: No Recent changes in overall health/medication: Yes,  adjustment of meds Prior history of physical therapy for balance:  No Dominant hand: right Imaging: Yes  Red flags: Positive for basal cell carcinoma, Negative for abdominal pain, chills/fever, night sweats, nausea, vomiting, unrelenting pain  PRECAUTIONS: Fall  WEIGHT BEARING RESTRICTIONS: No  FALLS: Has patient fallen in last 6 months? Yes. Number of falls 1, Directional pattern for falls: No  Living Environment Lives with: lives with their spouse Lives in: House/apartment Stairs: one level home, 2 steps to enter from the garage with L rail during ascend.  Has following equipment at home: grab bars in walk-in shower, no seat, rollator (uses periodically)   Prior level of function: Independent  Occupational demands: Retired Nutritional therapist: hunting (I can't get into the stand anymore), previously like to play golf but he can no longer bend over to put the ball on the tee;  Patient Goals: Pt would like to improve his balance   OBJECTIVE:   Patient Surveys  ABC: To be completed  Cognition Patient is oriented to person, place, and time.  Recent memory is intact.  Remote memory is intact.  Attention span and concentration are intact.  Expressive speech is intact.  Patient's fund of knowledge is within normal limits for educational level.    Gross Musculoskeletal Assessment Tremor: BUE tremors observed, worsened with stress; Bulk: Normal Tone: Normal  Posture: Mild forward head and rounded shoulders  LE MMT: MMT (out of 5) Right  Left   Hip flexion 4 4-  Hip extension    Hip abduction (seated) 5 5  Hip adduction (seated) 5 5  Hip internal rotation     Hip external rotation    Knee flexion 5 5  Knee extension 4+ 4+  Ankle dorsiflexion 4+ 4+  Ankle plantarflexion Active Active  (* = pain; Blank rows = not tested)  Sensation Grossly intact to light touch throughout bilateral LEs as determined by testing dermatomes L2-S2. Proprioception, stereognosis, and hot/cold testing deferred on this date.  Reflexes R/L Knee Jerk (L3/4): 2+/2+  Ankle Jerk (S1/2): 2+/2+   Cranial Nerves Deferred  Coordination/Cerebellar Deferred  Transfers: Assistive device utilized: None  Sit to stand: Complete Independence Stand to sit: Complete Independence Chair to chair: Complete Independence Floor: Not assessed  Stairs: Level of Assistance: Complete Independence Stair Negotiation Technique: Alternating Pattern  with Bilateral Rails Number of Stairs: 4  Height of Stairs: 6  Comments: Decreased speed but good stability noted with single or bilateral UE support on rails  Gait: Gait pattern: decreased step length- Right, decreased step length- Left, poor foot clearance- Right, and poor foot clearance- Left Distance walked: 100' Assistive device utilized: None Level of assistance: Complete Independence Comments: Pt ambulates with decreased self-selected speed. Decreased step length bilaterally  BPPV TESTS:  Symptoms Duration Intensity Nystagmus  L Dix-Hallpike None   None  R Dix-Hallpike None   None  L Head Roll None   None  R Head Roll None   None  L Sidelying Test      R Sidelying Test      (blank = not tested)  mCTSIB: 5.4s  Functional Outcome Measures  05/30/23 06/07/23 Comments  BERG 49/56    DGI  17/24   FGA     TUG 19.7 seconds    5TSTS 13.0 seconds    6 Minute Walk Test     10 Meter Gait Speed Self-selected: 15.2s = 0.66 m/s; Fastest: 11.8s = 0.85 m/s    (Blank rows = not tested)  Coordination/Cerebellar Testing  Finger to Nose: Dysmetria BUE but difficult to assess secondary to tremor; Heel to Shin: WNL Rapid alternating  movements: WNL Finger Opposition: WNL Pronator Drift: Negative  Functional Outcome Measures 08/28/23  05/30/23 06/07/23 06/09/23 07/12/23 08/02/23 8/25/254  BERG 49/56   52/56 53/56 48/56   DGI  17/24    18/24  FGA        TUG 19.7 seconds   13.51 seconds 12.0s 10.8s  5TSTS 13.0 seconds   11.30 seconds 10.8s 10.6s  6 Minute Walk Test   916'   856'  10 Meter Gait Speed Self-selected: 15.2s = 0.66 m/s; Fastest: 11.8s = 0.85 m/s   Self-Selected: 13.46= 0.74 m/s; Fastest: 11.13 = 0.90 m/s Self-Selected: 13.7s =  0.73 m/s; Fastest:  10.4s =  0.96 m/s Self-Selected: 13.6s =  0.74 m/s; Fastest:  10.9s =  0.92 m/s  (Blank rows = not tested)   TODAY'S TREATMENT: 09/12/23   SUBJECTIVE: Pt states that he is doing well today.  No falls since the last therapy session and no significant stumbles. Denies resting pain upon arrival today. No specific questions or concerns.   PAIN: Denies   Therapeutic Activity NuStep L1-5 (seat 10, arms 10) x 10 minutes for BLE strengthening during interval history with therapist monitoring and adjusting resistance continually throughout; Nautilus resisted gait 70# forward, backward, R lateral, and L lateral x 3 each direction; Sit to stand holding 8# med ball 2 x 10;  Standing exercises with 7.5# AW: Hip flexion marches x 20 BLE; Hamstring curls x 20 BLE; Hip abduction x 20 BLE; Hip extension x 20 BLE;  Seated clams with manual resistance from therapist x 15 BLE; Seated adductor squeezes with manual resistance from therapist x 15 BLE; Seated alternating LAQ with 7.5# AW x 15 BLE;   Neuromuscular Re-education  Airex balance beam side stepping x multiple laps; Airex balance beam tandem stepping x multiple laps;  Airex feet together eyes open/closed x 30s each; Airex feet together eyes open with horizontal and vertical head turns x 30s each; Tandem stance with no UE support, 2 x 30 ea foot forward;  6 and 12 step taps x 10 each BLE; Airex alternating 6 and  12 step taps x 10 each BLE; Step ups to 6 steps x 10 BLE;    Not performed: Obstacle course in hallway, step ups, balancing, backwards and forwards walking 4 x 50 ft  Cross-body marches with 5# ankle weights (AW) x 3 laps in // bars; d;  SLS with 2 finger UE support, 3 x 30 ea foot; MidRow Black TB 2 x 10 x 3 sec scap squeeze; Pallof Press Black TB  2 x 10 x 3 sec scap squeeze hold; Nautilus lunge chest press 2 x 10 ea LE in front;  Airex feet together ball passes around body with therapist x multiple bouts each direction; Standing heel raises x 15;   PATIENT EDUCATION:  Education details: Outcome measures and goals Person educated: Patient Education method: Explanation, Demonstration, and Verbal cues Education comprehension: verbalized understanding   HOME EXERCISE PROGRAM:  Access Code: [email or web address] URL: [email or web address] Date: 06/07/2023 Prepared by: Selinda Eck  Exercises - Sit to Stand Without Arm Support  - 1 x daily - 7 x weekly - 3 sets - 10 reps - Heel Raises with Counter Support  - 1 x daily - 7 x weekly - 3 sets - 10 reps - Standing Romberg to 1/2 Tandem Stance  - 1 x daily - 7 x weekly - 3 reps -  30s hold  ASSESSMENT:  CLINICAL IMPRESSION: Progressed functional strengthening and balance exercises during session today. Increased ankle weights to 7.5#. Plan to progress strengthening at future sessions as well as endurance activities. Pt encouraged to follow-up as scheduled. He would benefit from continued physical therapy services at this time so he can increase his strength, decrease his ambulation falls risk, and improve his balance so he can participate safely in his environment.   OBJECTIVE IMPAIRMENTS: Abnormal gait, decreased balance, difficulty walking, decreased strength, and dizziness.   ACTIVITY LIMITATIONS: standing, stairs, transfers, and caring for others  PARTICIPATION LIMITATIONS: meal prep, cleaning, laundry, shopping, and  community activity  PERSONAL FACTORS: Age, Past/current experiences, Time since onset of injury/illness/exacerbation, and 3+ comorbidities: back pain, tremors, anxiety, depression are also affecting patient's functional outcome.   REHAB POTENTIAL: Good  CLINICAL DECISION MAKING: Unstable/unpredictable  EVALUATION COMPLEXITY: High   GOALS: Goals reviewed with patient? No  SHORT TERM GOALS: Target date:  Pt will be independent with HEP in order to improve strength and balance in order to decrease fall risk and improve function at home. Baseline:  Goal status: MET   LONG TERM GOALS: Target date: 10/23/2023   Pt will improve ABC by at least 13% in order to demonstrate clinically significant improvement in balance confidence.  Baseline: 62.2%, 07/12/23: 70%; 08/02/23: 45.6%; 08/28/23: 66.6% Goal status: PARTIALLY MET  2.  Pt will improve BERG by at least 3 points in order to demonstrate clinically significant improvement in balance.   Baseline: 49/56; 07/12/23: 52/56; 08/02/23: 53/56; 08/28/23: 48/56; Goal status: ONGOING  3.  Pt will decrease TUG to below 14 seconds/decrease in order to demonstrate decreased fall risk.       Baseline: 19.7s, 07/12/23: 13.51 sec; 08/02/23: 12.0s; 08/28/23: 10.8s Goal status: MET  4. Pt will increase self-selected 47m gait speed to at least 0.80 m/s in order to demonstrate clinically significant improvement in community ambulation.     Baseline: 0.66 m/s, 07/12/23: 0.74 m/s; 08/02/23: 0.73 m/s; 08/28/23: 13.6s =  0.74 m/s; Goal status: PARTIALLY MET  5. Pt will increase by at least 32m (15ft) in order to demonstrate clinically significant improvement in cardiopulmonary endurance and community ambulation      Baseline: 06/09/23: 916'; 08/02/23: Deferred; 08/28/23: 856'; Goal status: ONGOING  PLAN: PT FREQUENCY: 2x/week  PT DURATION: 8 weeks  PLANNED INTERVENTIONS: Therapeutic exercises, Therapeutic activity, Neuromuscular re-education, Balance training,  Gait training, Patient/Family education, Self Care, Joint mobilization, Joint manipulation, Vestibular training, Canalith repositioning, Orthotic/Fit training, DME instructions, Dry Needling, Electrical stimulation, Spinal manipulation, Spinal mobilization, Cryotherapy, Moist heat, Taping, Traction, Ultrasound, Ionotophoresis 4mg /ml Dexamethasone , Manual therapy, and Re-evaluation.  PLAN FOR NEXT SESSION: progress balance and endurance exercises, review/modify HEP as necessary;   Selinda BIRCH Trejon Duford PT, DPT, GCS  09/12/2023, 5:07 PM

## 2023-09-16 NOTE — Therapy (Signed)
 OUTPATIENT PHYSICAL THERAPY BALANCE TREATMENT  Patient Name: Wesley Preston. MRN: [id number] DOB:1940/02/27, 83 y.o., male Today's Date: 09/18/2023  END OF SESSION:  PT End of Session - 09/18/23 0936     Visit Number 23    Number of Visits 41    Date for PT Re-Evaluation 10/23/23    Authorization Type [employer] AUTH Approved [id number] visits between 09/14/23-10/12/23  [facility] AUTH Approved [id number] visits between 08/17/23-09/14/23  [facility] AUTH [id number] Approved 16 visits between 06/07/23-08/02/23  [facility] Medicare 2025  CO:ajdzi on auth    Authorization - Visit Number 1    Authorization - Number of Visits 4    PT Start Time [phone]    PT Stop Time 1015    PT Time Calculation (min) 39 min    Equipment Utilized During Treatment Gait belt    Activity Tolerance Patient tolerated treatment well    Behavior During Therapy WFL for tasks assessed/performed         Past Medical History:  Diagnosis Date   Anxiety    Back pain    BPH (benign prostatic hyperplasia)    Cancer (HCC)    BASAL CELL   Chest pain, non-cardiac    Colon polyps    Complication of anesthesia    anesthesia stays a long time   COPD (chronic obstructive pulmonary disease) (HCC)    Depression    Essential tremor    Essential tremor    Family history of adverse reaction to anesthesia    DAUGHTERS GET NAUSEATED   GERD (gastroesophageal reflux disease)    H/O   Helicobacter pylori (H. pylori) infection    Hyperlipidemia    Neuromuscular disorder (HCC)    TREMORS   Reactive airway disease    Reactive airway disease    Restless leg syndrome    Past Surgical History:  Procedure Laterality Date   COLONOSCOPY  2013   COLONOSCOPY WITH PROPOFOL  N/A 03/21/2016   Procedure: COLONOSCOPY WITH PROPOFOL ;  Surgeon: Lamar ONEIDA Holmes, MD;  Location: [facility] ENDOSCOPY;  Service: Endoscopy;  Laterality: N/A;   COLONOSCOPY WITH PROPOFOL  N/A 03/09/2018   Procedure: COLONOSCOPY WITH PROPOFOL ;  Surgeon: Holmes Lamar ONEIDA, MD;  Location: [facility] ENDOSCOPY;   Service: Endoscopy;  Laterality: N/A;   ESOPHAGOGASTRODUODENOSCOPY     ESOPHAGOGASTRODUODENOSCOPY N/A 03/09/2018   Procedure: ESOPHAGOGASTRODUODENOSCOPY (EGD);  Surgeon: Holmes Lamar ONEIDA, MD;  Location: [facility] ENDOSCOPY;  Service: Endoscopy;  Laterality: N/A;   HERNIA REPAIR     HOLEP-LASER ENUCLEATION OF THE PROSTATE WITH MORCELLATION N/A 05/10/2019   Procedure: HOLEP-LASER ENUCLEATION OF THE PROSTATE WITH MORCELLATION;  Surgeon: Francisca Redell BROCKS, MD;  Location: [facility] ORS;  Service: Urology;  Laterality: N/A;   INGUINAL HERNIA REPAIR Right 08/01/2014   Procedure: RIGHT INGUINAL HERNIA  REPAIR WITH MESH ;  Surgeon: Reyes LELON Cota, MD;  Location: [facility] ORS;  Service: General;  Laterality: Right; with large Ultra Pro mesh   NASAL SINUS SURGERY     ROTATOR CUFF REPAIR     TRIGGER FINGER RELEASE     Patient Active Problem List   Diagnosis Date Noted   Postprocedural stricture of urethra at fossa navicularis 07/09/2019   BPH with obstruction/lower urinary tract symptoms 08/21/2018   Restless leg syndrome 07/24/2018   Reactive airway disease 07/24/2018   Pure hypercholesterolemia 07/24/2018   Mechanical back pain 07/24/2018   History of Helicobacter pylori infection 07/24/2018   Essential tremor 07/24/2018   Depression 07/24/2018   BPH associated with nocturia 07/24/2018   Benign essential  hypertension 07/24/2018   Anxiety 07/24/2018   Trigger finger of left thumb 04/14/2017   Hand pain, left 04/14/2017   Visual disturbance 07/18/2016   Tremor 07/18/2016   Imbalance 07/18/2016   Dizziness 07/18/2016   Right inguinal hernia 07/12/2014    PCP: Auston Reyes BIRCH, MD  REFERRING PROVIDER: Auston Reyes BIRCH, MD   REFERRING DIAG:  R26.89 (ICD-10-CM) - Other abnormalities of gait and mobility  R42 (ICD-10-CM) - Dizziness and giddiness   RATIONALE FOR EVALUATION AND TREATMENT: Rehabilitation  THERAPY DIAG: Unsteadiness on feet  Muscle weakness (generalized)  ONSET DATE: Multiple  years  FOLLOW-UP APPT SCHEDULED WITH REFERRING PROVIDER: Yes, neurology;  FROM INITIAL EVALUATION SUBJECTIVE:                                                                                                                                                                                         SUBJECTIVE STATEMENT:  Unsteadiness  PERTINENT HISTORY:  Patient with worsening gait and balance. Feels he is dragging his feet. Infrequently using assistive device. Improved RLS since starting gabapentin. Intermittent episodes of vertigo with position changes.  He goes to [employer] for exercise 1-2x/wk. Ongoing tremors in bilateral hands, worse with activity or stress. He reports 4 falls with the most recent about 1 week ago. Pt reports that he was stepping backwards when he fell. He couldn't get up and had to call extended family to help him from the ground. His wife was not strong enough to assist. He denies any significant injury from the fall. He was referred to PT for sensory ataxia and it was recommended he use a walker as well as perform balance exercise.  08/02/2022 MR BRAIN W WO MR ORBITS W WO IMPRESSION:  1. Atrophic right optic nerve which may reflect sequela of prior  optic neuritis.  2. Otherwise unremarkable appearance of the orbits with no acute  finding.  3. No acute intracranial pathology.  4. Moderate background chronic small-vessel ischemic change.   12/30/2019 MR BRAIN WO IMPRESSION:  No evidence of recent infarction, hemorrhage, or mass. Mild progression of parenchymal volume loss and probable chronic microvascular ischemic changes since 2018. Small chronic left pontine infarcts.   06/10/16 CAROTID ARTERY ULTRASOUND  IMPRESSION: 1. Minimal bilateral carotid bifurcation atherosclerotic vascular plaque. No flow limiting stenosis. Degree of stenosis less than 50% bilaterally.  2. Vertebrals are patent with antegrade flow.  06/02/16  MRI BRAIN WO CONTRAST   IMPRESSION: Atrophy. Small vessel disease. Evidence for chronic brainstem lacunar infarction. No acute intracranial findings. Chronic pansinusitis. Despite prior surgery, significant fluid accumulation and mucosal thickening can be seen, particularly in the RIGHT greater than LEFT  frontoethmoid regions.    Pain: No Numbness/Tingling: No, but reports some feet burning; Focal Weakness: No Recent changes in overall health/medication: Yes, adjustment of meds Prior history of physical therapy for balance:  No Dominant hand: right Imaging: Yes  Red flags: Positive for basal cell carcinoma, Negative for abdominal pain, chills/fever, night sweats, nausea, vomiting, unrelenting pain  PRECAUTIONS: Fall  WEIGHT BEARING RESTRICTIONS: No  FALLS: Has patient fallen in last 6 months? Yes. Number of falls 1, Directional pattern for falls: No  Living Environment Lives with: lives with their spouse Lives in: House/apartment Stairs: one level home, 2 steps to enter from the garage with L rail during ascend.  Has following equipment at home: grab bars in walk-in shower, no seat, rollator (uses periodically)   Prior level of function: Independent  Occupational demands: Retired Nutritional therapist: hunting (I can't get into the stand anymore), previously like to play golf but he can no longer bend over to put the ball on the tee;  Patient Goals: Pt would like to improve his balance   OBJECTIVE:   Patient Surveys  ABC: To be completed  Cognition Patient is oriented to person, place, and time.  Recent memory is intact.  Remote memory is intact.  Attention span and concentration are intact.  Expressive speech is intact.  Patient's fund of knowledge is within normal limits for educational level.    Gross Musculoskeletal Assessment Tremor: BUE tremors observed, worsened with stress; Bulk: Normal Tone: Normal  Posture: Mild forward head and rounded shoulders  LE MMT: MMT (out of 5)  Right  Left   Hip flexion 4 4-  Hip extension    Hip abduction (seated) 5 5  Hip adduction (seated) 5 5  Hip internal rotation    Hip external rotation    Knee flexion 5 5  Knee extension 4+ 4+  Ankle dorsiflexion 4+ 4+  Ankle plantarflexion Active Active  (* = pain; Blank rows = not tested)  Sensation Grossly intact to light touch throughout bilateral LEs as determined by testing dermatomes L2-S2. Proprioception, stereognosis, and hot/cold testing deferred on this date.  Reflexes R/L Knee Jerk (L3/4): 2+/2+  Ankle Jerk (S1/2): 2+/2+   Cranial Nerves Deferred  Coordination/Cerebellar Deferred  Transfers: Assistive device utilized: None  Sit to stand: Complete Independence Stand to sit: Complete Independence Chair to chair: Complete Independence Floor: Not assessed  Stairs: Level of Assistance: Complete Independence Stair Negotiation Technique: Alternating Pattern  with Bilateral Rails Number of Stairs: 4  Height of Stairs: 6  Comments: Decreased speed but good stability noted with single or bilateral UE support on rails  Gait: Gait pattern: decreased step length- Right, decreased step length- Left, poor foot clearance- Right, and poor foot clearance- Left Distance walked: 100' Assistive device utilized: None Level of assistance: Complete Independence Comments: Pt ambulates with decreased self-selected speed. Decreased step length bilaterally  BPPV TESTS:  Symptoms Duration Intensity Nystagmus  L Dix-Hallpike None   None  R Dix-Hallpike None   None  L Head Roll None   None  R Head Roll None   None  L Sidelying Test      R Sidelying Test      (blank = not tested)  mCTSIB: 5.4s  Functional Outcome Measures  05/30/23 06/07/23 Comments  BERG 49/56    DGI  17/24   FGA     TUG 19.7 seconds    5TSTS 13.0 seconds    6 Minute Walk Test     10 Meter  Gait Speed Self-selected: 15.2s = 0.66 m/s; Fastest: 11.8s = 0.85 m/s    (Blank rows = not  tested)  Coordination/Cerebellar Testing Finger to Nose: Dysmetria BUE but difficult to assess secondary to tremor; Heel to Shin: WNL Rapid alternating movements: WNL Finger Opposition: WNL Pronator Drift: Negative  Functional Outcome Measures 08/28/23  05/30/23 06/07/23 06/09/23 07/12/23 08/02/23 8/25/254  BERG 49/56   52/56 53/56 48/56   DGI  17/24    18/24  FGA        TUG 19.7 seconds   13.51 seconds 12.0s 10.8s  5TSTS 13.0 seconds   11.30 seconds 10.8s 10.6s  6 Minute Walk Test   916'   856'  10 Meter Gait Speed Self-selected: 15.2s = 0.66 m/s; Fastest: 11.8s = 0.85 m/s   Self-Selected: 13.46= 0.74 m/s; Fastest: 11.13 = 0.90 m/s Self-Selected: 13.7s =  0.73 m/s; Fastest:  10.4s =  0.96 m/s Self-Selected: 13.6s =  0.74 m/s; Fastest:  10.9s =  0.92 m/s  (Blank rows = not tested)   TODAY'S TREATMENT: 09/18/23   SUBJECTIVE: Pt states that he is doing well today.  No falls since the last therapy session and no significant stumbles. Reports 4/10 low back pain upon arrival which is an intermittent chronic issue for him. No specific questions or concerns.   PAIN: 4/10 low back pain   Therapeutic Activity NuStep L1-5 BLE only (seat 10) x 5 minutes for BLE strengthening during interval history with therapist monitoring and adjusting resistance continually throughout; Forward and lateral 6 step ups x 15 BLE each;  Sit to stand holding 8# med ball 2 x 10; Seated clams with manual resistance from therapist x 15 BLE; Seated adductor squeezes with manual resistance from therapist x 15 BLE;   Neuromuscular Re-education  Airex alternating 6 and 12 step taps x 10 each BLE; Airex staggered stance with front foot on 6 step x 30s BLE; Airex staggered stance with front foot on 6 step with horizontal/vertical head turns x 60s BLE; Airex balance beam side stepping x multiple laps; Airex balance beam tandem balance alternating forward LE x 60s each; Airex balance beam tandem stepping x multiple  laps;   Not performed: Obstacle course in hallway, step ups, balancing, backwards and forwards walking 4 x 50 ft  Cross-body marches with 5# ankle weights (AW) x 3 laps in // bars; SLS with 2 finger UE support, 3 x 30 ea foot; MidRow Black TB 2 x 10 x 3 sec scap squeeze; Pallof Press Black TB  2 x 10 x 3 sec scap squeeze hold; Nautilus lunge chest press 2 x 10 ea LE in front;  Airex feet together ball passes around body with therapist x multiple bouts each direction; Standing heel raises x 15; Airex feet together eyes open/closed x 30s each; Airex feet together eyes open with horizontal and vertical head turns x 30s each; Nautilus resisted gait 70# forward, backward, R lateral, and L lateral x 3 each direction; Seated alternating LAQ with 7.5# AW x 15 BLE; Standing exercises with 7.5# AW: Hip flexion marches x 20 BLE; Hamstring curls x 20 BLE; Hip abduction x 20 BLE; Hip extension x 20 BLE;   PATIENT EDUCATION:  Education details: Plan of care, Pt educated throughout session about proper posture and technique with exercises. Improved exercise technique, movement at target joints, use of target muscles after min to mod verbal, visual, tactile cues.  Person educated: Patient Education method: Explanation, Demonstration, and Verbal cues Education comprehension: verbalized understanding  HOME EXERCISE PROGRAM:  Access Code: [email or web address] URL: [email or web address] Date: 06/07/2023 Prepared by: Selinda Eck  Exercises - Sit to Stand Without Arm Support  - 1 x daily - 7 x weekly - 3 sets - 10 reps - Heel Raises with Counter Support  - 1 x daily - 7 x weekly - 3 sets - 10 reps - Standing Romberg to 1/2 Tandem Stance  - 1 x daily - 7 x weekly - 3 reps - 30s hold  ASSESSMENT:  CLINICAL IMPRESSION: Progressed functional strengthening and balance exercises during session today. He denies any increase in pain or significant fatigue during session. Plan to progress  strengthening at future sessions as well as endurance activities. Pt encouraged to follow-up as scheduled. He would benefit from continued physical therapy services at this time so he can increase his strength, decrease his ambulation falls risk, and improve his balance so he can participate safely in his environment.   OBJECTIVE IMPAIRMENTS: Abnormal gait, decreased balance, difficulty walking, decreased strength, and dizziness.   ACTIVITY LIMITATIONS: standing, stairs, transfers, and caring for others  PARTICIPATION LIMITATIONS: meal prep, cleaning, laundry, shopping, and community activity  PERSONAL FACTORS: Age, Past/current experiences, Time since onset of injury/illness/exacerbation, and 3+ comorbidities: back pain, tremors, anxiety, depression are also affecting patient's functional outcome.   REHAB POTENTIAL: Good  CLINICAL DECISION MAKING: Unstable/unpredictable  EVALUATION COMPLEXITY: High   GOALS: Goals reviewed with patient? No  SHORT TERM GOALS: Target date:  Pt will be independent with HEP in order to improve strength and balance in order to decrease fall risk and improve function at home. Baseline:  Goal status: MET   LONG TERM GOALS: Target date: 10/23/2023   Pt will improve ABC by at least 13% in order to demonstrate clinically significant improvement in balance confidence.  Baseline: 62.2%, 07/12/23: 70%; 08/02/23: 45.6%; 08/28/23: 66.6% Goal status: PARTIALLY MET  2.  Pt will improve BERG by at least 3 points in order to demonstrate clinically significant improvement in balance.   Baseline: 49/56; 07/12/23: 52/56; 08/02/23: 53/56; 08/28/23: 48/56; Goal status: ONGOING  3.  Pt will decrease TUG to below 14 seconds/decrease in order to demonstrate decreased fall risk.       Baseline: 19.7s, 07/12/23: 13.51 sec; 08/02/23: 12.0s; 08/28/23: 10.8s Goal status: MET  4. Pt will increase self-selected 49m gait speed to at least 0.80 m/s in order to demonstrate clinically  significant improvement in community ambulation.     Baseline: 0.66 m/s, 07/12/23: 0.74 m/s; 08/02/23: 0.73 m/s; 08/28/23: 13.6s =  0.74 m/s; Goal status: PARTIALLY MET  5. Pt will increase by at least 42m (124ft) in order to demonstrate clinically significant improvement in cardiopulmonary endurance and community ambulation      Baseline: 06/09/23: 916'; 08/02/23: Deferred; 08/28/23: 856'; Goal status: ONGOING  PLAN: PT FREQUENCY: 2x/week  PT DURATION: 8 weeks  PLANNED INTERVENTIONS: Therapeutic exercises, Therapeutic activity, Neuromuscular re-education, Balance training, Gait training, Patient/Family education, Self Care, Joint mobilization, Joint manipulation, Vestibular training, Canalith repositioning, Orthotic/Fit training, DME instructions, Dry Needling, Electrical stimulation, Spinal manipulation, Spinal mobilization, Cryotherapy, Moist heat, Taping, Traction, Ultrasound, Ionotophoresis 4mg /ml Dexamethasone , Manual therapy, and Re-evaluation.  PLAN FOR NEXT SESSION: progress balance and endurance exercises, review/modify HEP as necessary;   Selinda BIRCH Jeniya Flannigan PT, DPT, GCS  09/18/2023, 11:27 AM

## 2023-09-18 ENCOUNTER — Ambulatory Visit

## 2023-09-18 DIAGNOSIS — M6281 Muscle weakness (generalized): Secondary | ICD-10-CM

## 2023-09-18 DIAGNOSIS — R2681 Unsteadiness on feet: Secondary | ICD-10-CM

## 2023-09-22 NOTE — Therapy (Signed)
 OUTPATIENT PHYSICAL THERAPY BALANCE TREATMENT  Patient Name: Wesley Preston. MRN: [id number] DOB:Jun 03, 1940, 83 y.o., male Today's Date: 09/26/2023  END OF SESSION:  PT End of Session - 09/26/23 1154     Visit Number 24    Number of Visits 41    Date for Recertification  10/23/23    Authorization Type [employer] AUTH Approved [id number] visits between 09/14/23-10/12/23  [facility] AUTH Approved [id number] visits between 08/17/23-09/14/23  [facility] AUTH [id number] Approved 16 visits between 06/07/23-08/02/23  [facility] Medicare 2025  CO:ajdzi on auth    Authorization - Visit Number 2    Authorization - Number of Visits 4    PT Start Time 1147    PT Stop Time 1230    PT Time Calculation (min) 43 min    Equipment Utilized During Treatment Gait belt    Activity Tolerance Patient tolerated treatment well    Behavior During Therapy WFL for tasks assessed/performed         Past Medical History:  Diagnosis Date   Anxiety    Back pain    BPH (benign prostatic hyperplasia)    Cancer (HCC)    BASAL CELL   Chest pain, non-cardiac    Colon polyps    Complication of anesthesia    anesthesia stays a long time   COPD (chronic obstructive pulmonary disease) (HCC)    Depression    Essential tremor    Essential tremor    Family history of adverse reaction to anesthesia    DAUGHTERS GET NAUSEATED   GERD (gastroesophageal reflux disease)    H/O   Helicobacter pylori (H. pylori) infection    Hyperlipidemia    Neuromuscular disorder (HCC)    TREMORS   Reactive airway disease    Reactive airway disease    Restless leg syndrome    Past Surgical History:  Procedure Laterality Date   COLONOSCOPY  2013   COLONOSCOPY WITH PROPOFOL  N/A 03/21/2016   Procedure: COLONOSCOPY WITH PROPOFOL ;  Surgeon: Lamar ONEIDA Holmes, MD;  Location: [facility] ENDOSCOPY;  Service: Endoscopy;  Laterality: N/A;   COLONOSCOPY WITH PROPOFOL  N/A 03/09/2018   Procedure: COLONOSCOPY WITH PROPOFOL ;  Surgeon: Holmes Lamar ONEIDA, MD;  Location: [facility] ENDOSCOPY;   Service: Endoscopy;  Laterality: N/A;   ESOPHAGOGASTRODUODENOSCOPY     ESOPHAGOGASTRODUODENOSCOPY N/A 03/09/2018   Procedure: ESOPHAGOGASTRODUODENOSCOPY (EGD);  Surgeon: Holmes Lamar ONEIDA, MD;  Location: [facility] ENDOSCOPY;  Service: Endoscopy;  Laterality: N/A;   HERNIA REPAIR     HOLEP-LASER ENUCLEATION OF THE PROSTATE WITH MORCELLATION N/A 05/10/2019   Procedure: HOLEP-LASER ENUCLEATION OF THE PROSTATE WITH MORCELLATION;  Surgeon: Francisca Redell BROCKS, MD;  Location: [facility] ORS;  Service: Urology;  Laterality: N/A;   INGUINAL HERNIA REPAIR Right 08/01/2014   Procedure: RIGHT INGUINAL HERNIA  REPAIR WITH MESH ;  Surgeon: Reyes LELON Cota, MD;  Location: [facility] ORS;  Service: General;  Laterality: Right; with large Ultra Pro mesh   NASAL SINUS SURGERY     ROTATOR CUFF REPAIR     TRIGGER FINGER RELEASE     Patient Active Problem List   Diagnosis Date Noted   Postprocedural stricture of urethra at fossa navicularis 07/09/2019   BPH with obstruction/lower urinary tract symptoms 08/21/2018   Restless leg syndrome 07/24/2018   Reactive airway disease 07/24/2018   Pure hypercholesterolemia 07/24/2018   Mechanical back pain 07/24/2018   History of Helicobacter pylori infection 07/24/2018   Essential tremor 07/24/2018   Depression 07/24/2018   BPH associated with nocturia 07/24/2018   Benign essential  hypertension 07/24/2018   Anxiety 07/24/2018   Trigger finger of left thumb 04/14/2017   Hand pain, left 04/14/2017   Visual disturbance 07/18/2016   Tremor 07/18/2016   Imbalance 07/18/2016   Dizziness 07/18/2016   Right inguinal hernia 07/12/2014    PCP: Auston Reyes BIRCH, MD  REFERRING PROVIDER: Auston Reyes BIRCH, MD   REFERRING DIAG:  R26.89 (ICD-10-CM) - Other abnormalities of gait and mobility  R42 (ICD-10-CM) - Dizziness and giddiness   RATIONALE FOR EVALUATION AND TREATMENT: Rehabilitation  THERAPY DIAG: Unsteadiness on feet  Muscle weakness (generalized)  ONSET DATE: Multiple  years  FOLLOW-UP APPT SCHEDULED WITH REFERRING PROVIDER: Yes, neurology;  FROM INITIAL EVALUATION SUBJECTIVE:                                                                                                                                                                                         SUBJECTIVE STATEMENT:  Unsteadiness  PERTINENT HISTORY:  Patient with worsening gait and balance. Feels he is dragging his feet. Infrequently using assistive device. Improved RLS since starting gabapentin. Intermittent episodes of vertigo with position changes.  He goes to [employer] for exercise 1-2x/wk. Ongoing tremors in bilateral hands, worse with activity or stress. He reports 4 falls with the most recent about 1 week ago. Pt reports that he was stepping backwards when he fell. He couldn't get up and had to call extended family to help him from the ground. His wife was not strong enough to assist. He denies any significant injury from the fall. He was referred to PT for sensory ataxia and it was recommended he use a walker as well as perform balance exercise.  08/02/2022 MR BRAIN W WO MR ORBITS W WO IMPRESSION:  1. Atrophic right optic nerve which may reflect sequela of prior  optic neuritis.  2. Otherwise unremarkable appearance of the orbits with no acute  finding.  3. No acute intracranial pathology.  4. Moderate background chronic small-vessel ischemic change.   12/30/2019 MR BRAIN WO IMPRESSION:  No evidence of recent infarction, hemorrhage, or mass. Mild progression of parenchymal volume loss and probable chronic microvascular ischemic changes since 2018. Small chronic left pontine infarcts.   06/10/16 CAROTID ARTERY ULTRASOUND  IMPRESSION: 1. Minimal bilateral carotid bifurcation atherosclerotic vascular plaque. No flow limiting stenosis. Degree of stenosis less than 50% bilaterally.  2. Vertebrals are patent with antegrade flow.  06/02/16  MRI BRAIN WO CONTRAST   IMPRESSION: Atrophy. Small vessel disease. Evidence for chronic brainstem lacunar infarction. No acute intracranial findings. Chronic pansinusitis. Despite prior surgery, significant fluid accumulation and mucosal thickening can be seen, particularly in the RIGHT greater than LEFT  frontoethmoid regions.    Pain: No Numbness/Tingling: No, but reports some feet burning; Focal Weakness: No Recent changes in overall health/medication: Yes, adjustment of meds Prior history of physical therapy for balance:  No Dominant hand: right Imaging: Yes  Red flags: Positive for basal cell carcinoma, Negative for abdominal pain, chills/fever, night sweats, nausea, vomiting, unrelenting pain  PRECAUTIONS: Fall  WEIGHT BEARING RESTRICTIONS: No  FALLS: Has patient fallen in last 6 months? Yes. Number of falls 1, Directional pattern for falls: No  Living Environment Lives with: lives with their spouse Lives in: House/apartment Stairs: one level home, 2 steps to enter from the garage with L rail during ascend.  Has following equipment at home: grab bars in walk-in shower, no seat, rollator (uses periodically)   Prior level of function: Independent  Occupational demands: Retired Nutritional therapist: hunting (I can't get into the stand anymore), previously like to play golf but he can no longer bend over to put the ball on the tee;  Patient Goals: Pt would like to improve his balance   OBJECTIVE:   Patient Surveys  ABC: To be completed  Cognition Patient is oriented to person, place, and time.  Recent memory is intact.  Remote memory is intact.  Attention span and concentration are intact.  Expressive speech is intact.  Patient's fund of knowledge is within normal limits for educational level.    Gross Musculoskeletal Assessment Tremor: BUE tremors observed, worsened with stress; Bulk: Normal Tone: Normal  Posture: Mild forward head and rounded shoulders  LE MMT: MMT (out of 5)  Right  Left   Hip flexion 4 4-  Hip extension    Hip abduction (seated) 5 5  Hip adduction (seated) 5 5  Hip internal rotation    Hip external rotation    Knee flexion 5 5  Knee extension 4+ 4+  Ankle dorsiflexion 4+ 4+  Ankle plantarflexion Active Active  (* = pain; Blank rows = not tested)  Sensation Grossly intact to light touch throughout bilateral LEs as determined by testing dermatomes L2-S2. Proprioception, stereognosis, and hot/cold testing deferred on this date.  Reflexes R/L Knee Jerk (L3/4): 2+/2+  Ankle Jerk (S1/2): 2+/2+   Cranial Nerves Deferred  Coordination/Cerebellar Deferred  Transfers: Assistive device utilized: None  Sit to stand: Complete Independence Stand to sit: Complete Independence Chair to chair: Complete Independence Floor: Not assessed  Stairs: Level of Assistance: Complete Independence Stair Negotiation Technique: Alternating Pattern  with Bilateral Rails Number of Stairs: 4  Height of Stairs: 6  Comments: Decreased speed but good stability noted with single or bilateral UE support on rails  Gait: Gait pattern: decreased step length- Right, decreased step length- Left, poor foot clearance- Right, and poor foot clearance- Left Distance walked: 100' Assistive device utilized: None Level of assistance: Complete Independence Comments: Pt ambulates with decreased self-selected speed. Decreased step length bilaterally  BPPV TESTS:  Symptoms Duration Intensity Nystagmus  L Dix-Hallpike None   None  R Dix-Hallpike None   None  L Head Roll None   None  R Head Roll None   None  L Sidelying Test      R Sidelying Test      (blank = not tested)  mCTSIB: 5.4s  Functional Outcome Measures  05/30/23 06/07/23 Comments  BERG 49/56    DGI  17/24   FGA     TUG 19.7 seconds    5TSTS 13.0 seconds    6 Minute Walk Test     10 Meter  Gait Speed Self-selected: 15.2s = 0.66 m/s; Fastest: 11.8s = 0.85 m/s    (Blank rows = not  tested)  Coordination/Cerebellar Testing Finger to Nose: Dysmetria BUE but difficult to assess secondary to tremor; Heel to Shin: WNL Rapid alternating movements: WNL Finger Opposition: WNL Pronator Drift: Negative  Functional Outcome Measures 08/28/23  05/30/23 06/07/23 06/09/23 07/12/23 08/02/23 8/25/254  BERG 49/56   52/56 53/56 48/56   DGI  17/24    18/24  FGA        TUG 19.7 seconds   13.51 seconds 12.0s 10.8s  5TSTS 13.0 seconds   11.30 seconds 10.8s 10.6s  6 Minute Walk Test   916'   856'  10 Meter Gait Speed Self-selected: 15.2s = 0.66 m/s; Fastest: 11.8s = 0.85 m/s   Self-Selected: 13.46= 0.74 m/s; Fastest: 11.13 = 0.90 m/s Self-Selected: 13.7s =  0.73 m/s; Fastest:  10.4s =  0.96 m/s Self-Selected: 13.6s =  0.74 m/s; Fastest:  10.9s =  0.92 m/s  (Blank rows = not tested)   TODAY'S TREATMENT: 09/26/23   SUBJECTIVE: Pt states that he is doing well today.  No falls since the last therapy session and no significant stumbles. Reports ongoing 3/10 low back pain upon arrival which is an intermittent chronic issue for him. No specific questions or concerns.   PAIN: 3/10 low back pain   Therapeutic Activity NuStep L1-4 BLE only (seat 10) x 10 minutes for BLE strengthening during interval history with therapist monitoring and adjusting resistance continually throughout;  Standing exercises with 7.5# AW: Hip flexion marches 2 x 20 BLE; Hamstring curls 2 x 20 BLE; Hip abduction 2 x 20 BLE; Hip extension 2 x 20 BLE;  Seated clams with manual resistance from therapist 2 x 10 BLE; Seated adductor squeezes with manual resistance from therapist 2 x 10 BLE; Seated alternating LAQ with 7.5# AW x 15 BLE;  Forward 6 step ups x 15 BLE each;    Not performed: Obstacle course in hallway, step ups, balancing, backwards and forwards walking 4 x 50 ft  Cross-body marches with 5# ankle weights (AW) x 3 laps in // bars; SLS with 2 finger UE support, 3 x 30 ea foot; MidRow Black TB 2 x 10 x  3 sec scap squeeze; Pallof Press Black TB  2 x 10 x 3 sec scap squeeze hold; Nautilus lunge chest press 2 x 10 ea LE in front;  Airex feet together ball passes around body with therapist x multiple bouts each direction; Standing heel raises x 15; Airex feet together eyes open/closed x 30s each; Airex alternating 6 and 12 step taps x 10 each BLE; Airex staggered stance with front foot on 6 step x 30s BLE; Airex staggered stance with front foot on 6 step with horizontal/vertical head turns x 60s BLE; Airex balance beam side stepping x multiple laps; Airex balance beam tandem balance alternating forward LE x 60s each; Airex balance beam tandem stepping x multiple laps; Sit to stand holding 8# med ball 2 x 10;   PATIENT EDUCATION:  Education details: Plan of care, Pt educated throughout session about proper posture and technique with exercises. Improved exercise technique, movement at target joints, use of target muscles after min to mod verbal, visual, tactile cues.  Person educated: Patient Education method: Explanation, Demonstration, and Verbal cues Education comprehension: verbalized understanding   HOME EXERCISE PROGRAM:  Access Code: [email or web address] URL: [email or web address] Date: 06/07/2023 Prepared by: Selinda Eck  Exercises - Sit to Stand Without Arm Support  -  1 x daily - 7 x weekly - 3 sets - 10 reps - Heel Raises with Counter Support  - 1 x daily - 7 x weekly - 3 sets - 10 reps - Standing Romberg to 1/2 Tandem Stance  - 1 x daily - 7 x weekly - 3 reps - 30s hold  ASSESSMENT:  CLINICAL IMPRESSION: Progressed functional strengthening during session today. He denies any increase in pain or significant fatigue during session. Plan to progress strengthening at future sessions as well as endurance activities. Pt encouraged to follow-up as scheduled. He would benefit from continued physical therapy services at this time so he can increase his strength, decrease his  ambulation falls risk, and improve his balance so he can participate safely in his environment.   OBJECTIVE IMPAIRMENTS: Abnormal gait, decreased balance, difficulty walking, decreased strength, and dizziness.   ACTIVITY LIMITATIONS: standing, stairs, transfers, and caring for others  PARTICIPATION LIMITATIONS: meal prep, cleaning, laundry, shopping, and community activity  PERSONAL FACTORS: Age, Past/current experiences, Time since onset of injury/illness/exacerbation, and 3+ comorbidities: back pain, tremors, anxiety, depression are also affecting patient's functional outcome.   REHAB POTENTIAL: Good  CLINICAL DECISION MAKING: Unstable/unpredictable  EVALUATION COMPLEXITY: High   GOALS: Goals reviewed with patient? No  SHORT TERM GOALS: Target date:  Pt will be independent with HEP in order to improve strength and balance in order to decrease fall risk and improve function at home. Baseline:  Goal status: MET   LONG TERM GOALS: Target date: 10/23/2023   Pt will improve ABC by at least 13% in order to demonstrate clinically significant improvement in balance confidence.  Baseline: 62.2%, 07/12/23: 70%; 08/02/23: 45.6%; 08/28/23: 66.6% Goal status: PARTIALLY MET  2.  Pt will improve BERG by at least 3 points in order to demonstrate clinically significant improvement in balance.   Baseline: 49/56; 07/12/23: 52/56; 08/02/23: 53/56; 08/28/23: 48/56; Goal status: ONGOING  3.  Pt will decrease TUG to below 14 seconds/decrease in order to demonstrate decreased fall risk.       Baseline: 19.7s, 07/12/23: 13.51 sec; 08/02/23: 12.0s; 08/28/23: 10.8s Goal status: MET  4. Pt will increase self-selected 93m gait speed to at least 0.80 m/s in order to demonstrate clinically significant improvement in community ambulation.     Baseline: 0.66 m/s, 07/12/23: 0.74 m/s; 08/02/23: 0.73 m/s; 08/28/23: 13.6s =  0.74 m/s; Goal status: PARTIALLY MET  5. Pt will increase by at least 4m (118ft) in order to  demonstrate clinically significant improvement in cardiopulmonary endurance and community ambulation      Baseline: 06/09/23: 916'; 08/02/23: Deferred; 08/28/23: 856'; Goal status: ONGOING  PLAN: PT FREQUENCY: 2x/week  PT DURATION: 8 weeks  PLANNED INTERVENTIONS: Therapeutic exercises, Therapeutic activity, Neuromuscular re-education, Balance training, Gait training, Patient/Family education, Self Care, Joint mobilization, Joint manipulation, Vestibular training, Canalith repositioning, Orthotic/Fit training, DME instructions, Dry Needling, Electrical stimulation, Spinal manipulation, Spinal mobilization, Cryotherapy, Moist heat, Taping, Traction, Ultrasound, Ionotophoresis 4mg /ml Dexamethasone , Manual therapy, and Re-evaluation.  PLAN FOR NEXT SESSION: progress balance and endurance exercises, review/modify HEP as necessary;   Selinda BIRCH Trayvion Embleton PT, DPT, GCS  09/26/2023, 2:26 PM

## 2023-09-26 ENCOUNTER — Ambulatory Visit

## 2023-09-26 DIAGNOSIS — R2681 Unsteadiness on feet: Secondary | ICD-10-CM

## 2023-09-26 DIAGNOSIS — M6281 Muscle weakness (generalized): Secondary | ICD-10-CM

## 2023-09-29 NOTE — Therapy (Signed)
 OUTPATIENT PHYSICAL THERAPY BALANCE TREATMENT  Patient Name: Aveon Colquhoun. MRN: [id number] DOB:10-02-1940, 83 y.o., male Today's Date: 10/03/2023  END OF SESSION:  PT End of Session - 10/02/23 1101     Visit Number 25    Number of Visits 41    Date for Recertification  10/23/23    Authorization Type [employer] AUTH Approved [id number] visits between 09/14/23-10/12/23  [facility] AUTH Approved [id number] visits between 08/17/23-09/14/23  [facility] AUTH [id number] Approved 16 visits between 06/07/23-08/02/23  [facility] Medicare 2025  CO:ajdzi on auth    Authorization - Visit Number 3    Authorization - Number of Visits 4    PT Start Time 1105    PT Stop Time 1145    PT Time Calculation (min) 40 min    Equipment Utilized During Treatment Gait belt    Activity Tolerance Patient tolerated treatment well    Behavior During Therapy WFL for tasks assessed/performed         Past Medical History:  Diagnosis Date   Anxiety    Back pain    BPH (benign prostatic hyperplasia)    Cancer (HCC)    BASAL CELL   Chest pain, non-cardiac    Colon polyps    Complication of anesthesia    anesthesia stays a long time   COPD (chronic obstructive pulmonary disease) (HCC)    Depression    Essential tremor    Essential tremor    Family history of adverse reaction to anesthesia    DAUGHTERS GET NAUSEATED   GERD (gastroesophageal reflux disease)    H/O   Helicobacter pylori (H. pylori) infection    Hyperlipidemia    Neuromuscular disorder (HCC)    TREMORS   Reactive airway disease    Reactive airway disease    Restless leg syndrome    Past Surgical History:  Procedure Laterality Date   COLONOSCOPY  2013   COLONOSCOPY WITH PROPOFOL  N/A 03/21/2016   Procedure: COLONOSCOPY WITH PROPOFOL ;  Surgeon: Lamar ONEIDA Holmes, MD;  Location: [facility] ENDOSCOPY;  Service: Endoscopy;  Laterality: N/A;   COLONOSCOPY WITH PROPOFOL  N/A 03/09/2018   Procedure: COLONOSCOPY WITH PROPOFOL ;  Surgeon: Holmes Lamar ONEIDA, MD;  Location: [facility] ENDOSCOPY;   Service: Endoscopy;  Laterality: N/A;   ESOPHAGOGASTRODUODENOSCOPY     ESOPHAGOGASTRODUODENOSCOPY N/A 03/09/2018   Procedure: ESOPHAGOGASTRODUODENOSCOPY (EGD);  Surgeon: Holmes Lamar ONEIDA, MD;  Location: [facility] ENDOSCOPY;  Service: Endoscopy;  Laterality: N/A;   HERNIA REPAIR     HOLEP-LASER ENUCLEATION OF THE PROSTATE WITH MORCELLATION N/A 05/10/2019   Procedure: HOLEP-LASER ENUCLEATION OF THE PROSTATE WITH MORCELLATION;  Surgeon: Francisca Redell BROCKS, MD;  Location: [facility] ORS;  Service: Urology;  Laterality: N/A;   INGUINAL HERNIA REPAIR Right 08/01/2014   Procedure: RIGHT INGUINAL HERNIA  REPAIR WITH MESH ;  Surgeon: Reyes LELON Cota, MD;  Location: [facility] ORS;  Service: General;  Laterality: Right; with large Ultra Pro mesh   NASAL SINUS SURGERY     ROTATOR CUFF REPAIR     TRIGGER FINGER RELEASE     Patient Active Problem List   Diagnosis Date Noted   Postprocedural stricture of urethra at fossa navicularis 07/09/2019   BPH with obstruction/lower urinary tract symptoms 08/21/2018   Restless leg syndrome 07/24/2018   Reactive airway disease 07/24/2018   Pure hypercholesterolemia 07/24/2018   Mechanical back pain 07/24/2018   History of Helicobacter pylori infection 07/24/2018   Essential tremor 07/24/2018   Depression 07/24/2018   BPH associated with nocturia 07/24/2018   Benign essential  hypertension 07/24/2018   Anxiety 07/24/2018   Trigger finger of left thumb 04/14/2017   Hand pain, left 04/14/2017   Visual disturbance 07/18/2016   Tremor 07/18/2016   Imbalance 07/18/2016   Dizziness 07/18/2016   Right inguinal hernia 07/12/2014   PCP: Auston Reyes BIRCH, MD  REFERRING PROVIDER: Auston Reyes BIRCH, MD   REFERRING DIAG:  R26.89 (ICD-10-CM) - Other abnormalities of gait and mobility  R42 (ICD-10-CM) - Dizziness and giddiness   RATIONALE FOR EVALUATION AND TREATMENT: Rehabilitation  THERAPY DIAG: Unsteadiness on feet  Muscle weakness (generalized)  ONSET DATE: Multiple  years  FOLLOW-UP APPT SCHEDULED WITH REFERRING PROVIDER: Yes, neurology;  FROM INITIAL EVALUATION SUBJECTIVE:                                                                                                                                                                                         SUBJECTIVE STATEMENT:  Unsteadiness  PERTINENT HISTORY:  Patient with worsening gait and balance. Feels he is dragging his feet. Infrequently using assistive device. Improved RLS since starting gabapentin. Intermittent episodes of vertigo with position changes.  He goes to [employer] for exercise 1-2x/wk. Ongoing tremors in bilateral hands, worse with activity or stress. He reports 4 falls with the most recent about 1 week ago. Pt reports that he was stepping backwards when he fell. He couldn't get up and had to call extended family to help him from the ground. His wife was not strong enough to assist. He denies any significant injury from the fall. He was referred to PT for sensory ataxia and it was recommended he use a walker as well as perform balance exercise.  08/02/2022 MR BRAIN W WO MR ORBITS W WO IMPRESSION:  1. Atrophic right optic nerve which may reflect sequela of prior  optic neuritis.  2. Otherwise unremarkable appearance of the orbits with no acute  finding.  3. No acute intracranial pathology.  4. Moderate background chronic small-vessel ischemic change.   12/30/2019 MR BRAIN WO IMPRESSION:  No evidence of recent infarction, hemorrhage, or mass. Mild progression of parenchymal volume loss and probable chronic microvascular ischemic changes since 2018. Small chronic left pontine infarcts.   06/10/16 CAROTID ARTERY ULTRASOUND  IMPRESSION: 1. Minimal bilateral carotid bifurcation atherosclerotic vascular plaque. No flow limiting stenosis. Degree of stenosis less than 50% bilaterally.  2. Vertebrals are patent with antegrade flow.  06/02/16  MRI BRAIN WO CONTRAST   IMPRESSION: Atrophy. Small vessel disease. Evidence for chronic brainstem lacunar infarction. No acute intracranial findings. Chronic pansinusitis. Despite prior surgery, significant fluid accumulation and mucosal thickening can be seen, particularly in the RIGHT greater than LEFT frontoethmoid  regions.    Pain: No Numbness/Tingling: No, but reports some feet burning; Focal Weakness: No Recent changes in overall health/medication: Yes, adjustment of meds Prior history of physical therapy for balance:  No Dominant hand: right Imaging: Yes  Red flags: Positive for basal cell carcinoma, Negative for abdominal pain, chills/fever, night sweats, nausea, vomiting, unrelenting pain  PRECAUTIONS: Fall  WEIGHT BEARING RESTRICTIONS: No  FALLS: Has patient fallen in last 6 months? Yes. Number of falls 1, Directional pattern for falls: No  Living Environment Lives with: lives with their spouse Lives in: House/apartment Stairs: one level home, 2 steps to enter from the garage with L rail during ascend.  Has following equipment at home: grab bars in walk-in shower, no seat, rollator (uses periodically)   Prior level of function: Independent  Occupational demands: Retired Nutritional therapist: hunting (I can't get into the stand anymore), previously like to play golf but he can no longer bend over to put the ball on the tee;  Patient Goals: Pt would like to improve his balance   OBJECTIVE:   Patient Surveys  ABC: To be completed  Cognition Patient is oriented to person, place, and time.  Recent memory is intact.  Remote memory is intact.  Attention span and concentration are intact.  Expressive speech is intact.  Patient's fund of knowledge is within normal limits for educational level.    Gross Musculoskeletal Assessment Tremor: BUE tremors observed, worsened with stress; Bulk: Normal Tone: Normal  Posture: Mild forward head and rounded shoulders  LE MMT: MMT (out of 5)  Right  Left   Hip flexion 4 4-  Hip extension    Hip abduction (seated) 5 5  Hip adduction (seated) 5 5  Hip internal rotation    Hip external rotation    Knee flexion 5 5  Knee extension 4+ 4+  Ankle dorsiflexion 4+ 4+  Ankle plantarflexion Active Active  (* = pain; Blank rows = not tested)  Sensation Grossly intact to light touch throughout bilateral LEs as determined by testing dermatomes L2-S2. Proprioception, stereognosis, and hot/cold testing deferred on this date.  Reflexes R/L Knee Jerk (L3/4): 2+/2+  Ankle Jerk (S1/2): 2+/2+   Cranial Nerves Deferred  Coordination/Cerebellar Deferred  Transfers: Assistive device utilized: None  Sit to stand: Complete Independence Stand to sit: Complete Independence Chair to chair: Complete Independence Floor: Not assessed  Stairs: Level of Assistance: Complete Independence Stair Negotiation Technique: Alternating Pattern  with Bilateral Rails Number of Stairs: 4  Height of Stairs: 6  Comments: Decreased speed but good stability noted with single or bilateral UE support on rails  Gait: Gait pattern: decreased step length- Right, decreased step length- Left, poor foot clearance- Right, and poor foot clearance- Left Distance walked: 100' Assistive device utilized: None Level of assistance: Complete Independence Comments: Pt ambulates with decreased self-selected speed. Decreased step length bilaterally  BPPV TESTS:  Symptoms Duration Intensity Nystagmus  L Dix-Hallpike None   None  R Dix-Hallpike None   None  L Head Roll None   None  R Head Roll None   None  L Sidelying Test      R Sidelying Test      (blank = not tested)  mCTSIB: 5.4s  Functional Outcome Measures  05/30/23 06/07/23 Comments  BERG 49/56    DGI  17/24   FGA     TUG 19.7 seconds    5TSTS 13.0 seconds    6 Minute Walk Test     10 Meter Gait  Speed Self-selected: 15.2s = 0.66 m/s; Fastest: 11.8s = 0.85 m/s    (Blank rows = not  tested)  Coordination/Cerebellar Testing Finger to Nose: Dysmetria BUE but difficult to assess secondary to tremor; Heel to Shin: WNL Rapid alternating movements: WNL Finger Opposition: WNL Pronator Drift: Negative  Functional Outcome Measures 08/28/23  05/30/23 06/07/23 06/09/23 07/12/23 08/02/23 8/25/254  BERG 49/56   52/56 53/56 48/56   DGI  17/24    18/24  FGA        TUG 19.7 seconds   13.51 seconds 12.0s 10.8s  5TSTS 13.0 seconds   11.30 seconds 10.8s 10.6s  6 Minute Walk Test   916'   856'  10 Meter Gait Speed Self-selected: 15.2s = 0.66 m/s; Fastest: 11.8s = 0.85 m/s   Self-Selected: 13.46= 0.74 m/s; Fastest: 11.13 = 0.90 m/s Self-Selected: 13.7s =  0.73 m/s; Fastest:  10.4s =  0.96 m/s Self-Selected: 13.6s =  0.74 m/s; Fastest:  10.9s =  0.92 m/s  (Blank rows = not tested)   TODAY'S TREATMENT: 10/03/2023    SUBJECTIVE: Pt states that he is doing well today.  No falls since the last therapy session and no significant stumbles. Reports ongoing chronic low back pain. No specific questions or concerns.   PAIN: 3/10 low back pain   Therapeutic Activity NuStep L1-4 BLE only (seat 10) x 10 minutes for BLE strengthening during interval history with therapist monitoring and adjusting resistance continually throughout; Nautilus 80# resisted gait forward, backward, R lateral, and L lateral x 3 each direction; Forward 6 step ups x 15 BLE each;  Standing squats 2 x 10; Walking lunges in // bars with BUE support x multiple lengths;  Standing exercises with 7.5# AW: Hip flexion marches x 20 BLE; Hamstring curls x 20 BLE; Hip abduction x 20 BLE; Hip extension x 20 BLE;  Seated clams with manual resistance from therapist 2 x 10 BLE; Seated adductor squeezes with manual resistance from therapist 2 x 10 BLE; Seated alternating LAQ with 7.5# AW x 15 BLE;   Not performed: Obstacle course in hallway, step ups, balancing, backwards and forwards walking 4 x 50 ft  Cross-body marches with  5# ankle weights (AW) x 3 laps in // bars; SLS with 2 finger UE support, 3 x 30 ea foot; MidRow Black TB 2 x 10 x 3 sec scap squeeze; Pallof Press Black TB  2 x 10 x 3 sec scap squeeze hold; Nautilus lunge chest press 2 x 10 ea LE in front;  Airex feet together ball passes around body with therapist x multiple bouts each direction; Standing heel raises x 15; Airex feet together eyes open/closed x 30s each; Airex alternating 6 and 12 step taps x 10 each BLE; Airex staggered stance with front foot on 6 step x 30s BLE; Airex staggered stance with front foot on 6 step with horizontal/vertical head turns x 60s BLE; Airex balance beam side stepping x multiple laps; Airex balance beam tandem balance alternating forward LE x 60s each; Airex balance beam tandem stepping x multiple laps; Sit to stand holding 8# med ball 2 x 10;   PATIENT EDUCATION:  Education details: Plan of care, Pt educated throughout session about proper posture and technique with exercises. Improved exercise technique, movement at target joints, use of target muscles after min to mod verbal, visual, tactile cues.  Person educated: Patient Education method: Explanation, Demonstration, and Verbal cues Education comprehension: verbalized understanding   HOME EXERCISE PROGRAM:  Access Code: [email or web address] URL: [email or web address] Date:  06/07/2023 Prepared by: Selinda Eck  Exercises - Sit to Stand Without Arm Support  - 1 x daily - 7 x weekly - 3 sets - 10 reps - Heel Raises with Counter Support  - 1 x daily - 7 x weekly - 3 sets - 10 reps - Standing Romberg to 1/2 Tandem Stance  - 1 x daily - 7 x weekly - 3 reps - 30s hold  ASSESSMENT:  CLINICAL IMPRESSION: Progressed functional strengthening during session today. He denies any increase in pain or significant fatigue during session. Plan to progress update outcome measures/goals with patient, review, HEP and discharge from therapy. Pt encouraged to  follow-up as scheduled. He would benefit from continued physical therapy services at this time so he can increase his strength, decrease his ambulation falls risk, and improve his balance so he can participate safely in his environment.   OBJECTIVE IMPAIRMENTS: Abnormal gait, decreased balance, difficulty walking, decreased strength, and dizziness.   ACTIVITY LIMITATIONS: standing, stairs, transfers, and caring for others  PARTICIPATION LIMITATIONS: meal prep, cleaning, laundry, shopping, and community activity  PERSONAL FACTORS: Age, Past/current experiences, Time since onset of injury/illness/exacerbation, and 3+ comorbidities: back pain, tremors, anxiety, depression are also affecting patient's functional outcome.   REHAB POTENTIAL: Good  CLINICAL DECISION MAKING: Unstable/unpredictable  EVALUATION COMPLEXITY: High   GOALS: Goals reviewed with patient? No  SHORT TERM GOALS: Target date:  Pt will be independent with HEP in order to improve strength and balance in order to decrease fall risk and improve function at home. Baseline:  Goal status: MET   LONG TERM GOALS: Target date: 10/23/2023   Pt will improve ABC by at least 13% in order to demonstrate clinically significant improvement in balance confidence.  Baseline: 62.2%, 07/12/23: 70%; 08/02/23: 45.6%; 08/28/23: 66.6% Goal status: PARTIALLY MET  2.  Pt will improve BERG by at least 3 points in order to demonstrate clinically significant improvement in balance.   Baseline: 49/56; 07/12/23: 52/56; 08/02/23: 53/56; 08/28/23: 48/56; Goal status: ONGOING  3.  Pt will decrease TUG to below 14 seconds/decrease in order to demonstrate decreased fall risk.       Baseline: 19.7s, 07/12/23: 13.51 sec; 08/02/23: 12.0s; 08/28/23: 10.8s Goal status: MET  4. Pt will increase self-selected 39m gait speed to at least 0.80 m/s in order to demonstrate clinically significant improvement in community ambulation.     Baseline: 0.66 m/s, 07/12/23: 0.74  m/s; 08/02/23: 0.73 m/s; 08/28/23: 13.6s =  0.74 m/s; Goal status: PARTIALLY MET  5. Pt will increase by at least 69m (169ft) in order to demonstrate clinically significant improvement in cardiopulmonary endurance and community ambulation      Baseline: 06/09/23: 916'; 08/02/23: Deferred; 08/28/23: 856'; Goal status: ONGOING  PLAN: PT FREQUENCY: 2x/week  PT DURATION: 8 weeks  PLANNED INTERVENTIONS: Therapeutic exercises, Therapeutic activity, Neuromuscular re-education, Balance training, Gait training, Patient/Family education, Self Care, Joint mobilization, Joint manipulation, Vestibular training, Canalith repositioning, Orthotic/Fit training, DME instructions, Dry Needling, Electrical stimulation, Spinal manipulation, Spinal mobilization, Cryotherapy, Moist heat, Taping, Traction, Ultrasound, Ionotophoresis 4mg /ml Dexamethasone , Manual therapy, and Re-evaluation.  PLAN FOR NEXT SESSION: progress balance and endurance exercises, review/modify HEP as necessary;   Selinda BIRCH Shanikia Kernodle PT, DPT, GCS  10/03/2023, 11:29 AM

## 2023-10-02 ENCOUNTER — Ambulatory Visit

## 2023-10-02 DIAGNOSIS — R2681 Unsteadiness on feet: Secondary | ICD-10-CM

## 2023-10-02 DIAGNOSIS — M6281 Muscle weakness (generalized): Secondary | ICD-10-CM

## 2023-10-04 ENCOUNTER — Ambulatory Visit: Attending: Internal Medicine

## 2023-10-04 DIAGNOSIS — R2681 Unsteadiness on feet: Secondary | ICD-10-CM | POA: Diagnosis present

## 2023-10-04 DIAGNOSIS — M6281 Muscle weakness (generalized): Secondary | ICD-10-CM | POA: Diagnosis present

## 2023-10-04 NOTE — Therapy (Signed)
 OUTPATIENT PHYSICAL THERAPY BALANCE TREATMENT/DISCHARGE  Patient Name: Wesley Preston. MRN: [id number] DOB:1940-03-09, 83 y.o., male Today's Date: 10/04/2023  END OF SESSION:  PT End of Session - 10/04/23 1145     Visit Number 26    Number of Visits 41    Date for Recertification  10/23/23    Authorization Type [employer] AUTH Approved [id number] visits between 09/14/23-10/12/23  [facility] AUTH Approved [id number] visits between 08/17/23-09/14/23  [facility] AUTH [id number] Approved 16 visits between 06/07/23-08/02/23  [facility] Medicare 2025  CO:ajdzi on auth    Authorization - Visit Number 4    Authorization - Number of Visits 4    PT Start Time 1105    PT Stop Time 1145    PT Time Calculation (min) 40 min    Equipment Utilized During Treatment Gait belt    Activity Tolerance Patient tolerated treatment well    Behavior During Therapy WFL for tasks assessed/performed          Past Medical History:  Diagnosis Date   Anxiety    Back pain    BPH (benign prostatic hyperplasia)    Cancer (HCC)    BASAL CELL   Chest pain, non-cardiac    Colon polyps    Complication of anesthesia    anesthesia stays a long time   COPD (chronic obstructive pulmonary disease) (HCC)    Depression    Essential tremor    Essential tremor    Family history of adverse reaction to anesthesia    DAUGHTERS GET NAUSEATED   GERD (gastroesophageal reflux disease)    H/O   Helicobacter pylori (H. pylori) infection    Hyperlipidemia    Neuromuscular disorder (HCC)    TREMORS   Reactive airway disease    Reactive airway disease    Restless leg syndrome    Past Surgical History:  Procedure Laterality Date   COLONOSCOPY  2013   COLONOSCOPY WITH PROPOFOL  N/A 03/21/2016   Procedure: COLONOSCOPY WITH PROPOFOL ;  Surgeon: Lamar ONEIDA Holmes, MD;  Location: [facility] ENDOSCOPY;  Service: Endoscopy;  Laterality: N/A;   COLONOSCOPY WITH PROPOFOL  N/A 03/09/2018   Procedure: COLONOSCOPY WITH PROPOFOL ;  Surgeon: Holmes Lamar ONEIDA, MD;  Location: [facility]  ENDOSCOPY;  Service: Endoscopy;  Laterality: N/A;   ESOPHAGOGASTRODUODENOSCOPY     ESOPHAGOGASTRODUODENOSCOPY N/A 03/09/2018   Procedure: ESOPHAGOGASTRODUODENOSCOPY (EGD);  Surgeon: Holmes Lamar ONEIDA, MD;  Location: [facility] ENDOSCOPY;  Service: Endoscopy;  Laterality: N/A;   HERNIA REPAIR     HOLEP-LASER ENUCLEATION OF THE PROSTATE WITH MORCELLATION N/A 05/10/2019   Procedure: HOLEP-LASER ENUCLEATION OF THE PROSTATE WITH MORCELLATION;  Surgeon: Francisca Redell BROCKS, MD;  Location: [facility] ORS;  Service: Urology;  Laterality: N/A;   INGUINAL HERNIA REPAIR Right 08/01/2014   Procedure: RIGHT INGUINAL HERNIA  REPAIR WITH MESH ;  Surgeon: Reyes LELON Cota, MD;  Location: [facility] ORS;  Service: General;  Laterality: Right; with large Ultra Pro mesh   NASAL SINUS SURGERY     ROTATOR CUFF REPAIR     TRIGGER FINGER RELEASE     Patient Active Problem List   Diagnosis Date Noted   Postprocedural stricture of urethra at fossa navicularis 07/09/2019   BPH with obstruction/lower urinary tract symptoms 08/21/2018   Restless leg syndrome 07/24/2018   Reactive airway disease 07/24/2018   Pure hypercholesterolemia 07/24/2018   Mechanical back pain 07/24/2018   History of Helicobacter pylori infection 07/24/2018   Essential tremor 07/24/2018   Depression 07/24/2018   BPH associated with nocturia 07/24/2018   Benign  essential hypertension 07/24/2018   Anxiety 07/24/2018   Trigger finger of left thumb 04/14/2017   Hand pain, left 04/14/2017   Visual disturbance 07/18/2016   Tremor 07/18/2016   Imbalance 07/18/2016   Dizziness 07/18/2016   Right inguinal hernia 07/12/2014   PCP: Auston Reyes BIRCH, MD  REFERRING PROVIDER: Auston Reyes BIRCH, MD   REFERRING DIAG:  R26.89 (ICD-10-CM) - Other abnormalities of gait and mobility  R42 (ICD-10-CM) - Dizziness and giddiness   RATIONALE FOR EVALUATION AND TREATMENT: Rehabilitation  THERAPY DIAG: Unsteadiness on feet  Muscle weakness (generalized)  ONSET DATE:  Multiple years  FOLLOW-UP APPT SCHEDULED WITH REFERRING PROVIDER: Yes, neurology;  FROM INITIAL EVALUATION SUBJECTIVE:                                                                                                                                                                                         SUBJECTIVE STATEMENT:  Unsteadiness  PERTINENT HISTORY:  Patient with worsening gait and balance. Feels he is dragging his feet. Infrequently using assistive device. Improved RLS since starting gabapentin. Intermittent episodes of vertigo with position changes.  He goes to [employer] for exercise 1-2x/wk. Ongoing tremors in bilateral hands, worse with activity or stress. He reports 4 falls with the most recent about 1 week ago. Pt reports that he was stepping backwards when he fell. He couldn't get up and had to call extended family to help him from the ground. His wife was not strong enough to assist. He denies any significant injury from the fall. He was referred to PT for sensory ataxia and it was recommended he use a walker as well as perform balance exercise.  08/02/2022 MR BRAIN W WO MR ORBITS W WO IMPRESSION:  1. Atrophic right optic nerve which may reflect sequela of prior  optic neuritis.  2. Otherwise unremarkable appearance of the orbits with no acute  finding.  3. No acute intracranial pathology.  4. Moderate background chronic small-vessel ischemic change.   12/30/2019 MR BRAIN WO IMPRESSION:  No evidence of recent infarction, hemorrhage, or mass. Mild progression of parenchymal volume loss and probable chronic microvascular ischemic changes since 2018. Small chronic left pontine infarcts.   06/10/16 CAROTID ARTERY ULTRASOUND  IMPRESSION: 1. Minimal bilateral carotid bifurcation atherosclerotic vascular plaque. No flow limiting stenosis. Degree of stenosis less than 50% bilaterally.  2. Vertebrals are patent with antegrade flow.  06/02/16  MRI BRAIN WO CONTRAST   IMPRESSION: Atrophy. Small vessel disease. Evidence for chronic brainstem lacunar infarction. No acute intracranial findings. Chronic pansinusitis. Despite prior surgery, significant fluid accumulation and mucosal thickening can be seen, particularly in the RIGHT greater than LEFT  frontoethmoid regions.    Pain: No Numbness/Tingling: No, but reports some feet burning; Focal Weakness: No Recent changes in overall health/medication: Yes, adjustment of meds Prior history of physical therapy for balance:  No Dominant hand: right Imaging: Yes  Red flags: Positive for basal cell carcinoma, Negative for abdominal pain, chills/fever, night sweats, nausea, vomiting, unrelenting pain  PRECAUTIONS: Fall  WEIGHT BEARING RESTRICTIONS: No  FALLS: Has patient fallen in last 6 months? Yes. Number of falls 1, Directional pattern for falls: No  Living Environment Lives with: lives with their spouse Lives in: House/apartment Stairs: one level home, 2 steps to enter from the garage with L rail during ascend.  Has following equipment at home: grab bars in walk-in shower, no seat, rollator (uses periodically)   Prior level of function: Independent  Occupational demands: Retired Nutritional therapist: hunting (I can't get into the stand anymore), previously like to play golf but he can no longer bend over to put the ball on the tee;  Patient Goals: Pt would like to improve his balance   OBJECTIVE:   Patient Surveys  ABC: To be completed  Cognition Patient is oriented to person, place, and time.  Recent memory is intact.  Remote memory is intact.  Attention span and concentration are intact.  Expressive speech is intact.  Patient's fund of knowledge is within normal limits for educational level.    Gross Musculoskeletal Assessment Tremor: BUE tremors observed, worsened with stress; Bulk: Normal Tone: Normal  Posture: Mild forward head and rounded shoulders  LE MMT: MMT (out of 5)  Right  Left   Hip flexion 4 4-  Hip extension    Hip abduction (seated) 5 5  Hip adduction (seated) 5 5  Hip internal rotation    Hip external rotation    Knee flexion 5 5  Knee extension 4+ 4+  Ankle dorsiflexion 4+ 4+  Ankle plantarflexion Active Active  (* = pain; Blank rows = not tested)  Sensation Grossly intact to light touch throughout bilateral LEs as determined by testing dermatomes L2-S2. Proprioception, stereognosis, and hot/cold testing deferred on this date.  Reflexes R/L Knee Jerk (L3/4): 2+/2+  Ankle Jerk (S1/2): 2+/2+   Cranial Nerves Deferred  Coordination/Cerebellar Deferred  Transfers: Assistive device utilized: None  Sit to stand: Complete Independence Stand to sit: Complete Independence Chair to chair: Complete Independence Floor: Not assessed  Stairs: Level of Assistance: Complete Independence Stair Negotiation Technique: Alternating Pattern  with Bilateral Rails Number of Stairs: 4  Height of Stairs: 6  Comments: Decreased speed but good stability noted with single or bilateral UE support on rails  Gait: Gait pattern: decreased step length- Right, decreased step length- Left, poor foot clearance- Right, and poor foot clearance- Left Distance walked: 100' Assistive device utilized: None Level of assistance: Complete Independence Comments: Pt ambulates with decreased self-selected speed. Decreased step length bilaterally  BPPV TESTS:  Symptoms Duration Intensity Nystagmus  L Dix-Hallpike None   None  R Dix-Hallpike None   None  L Head Roll None   None  R Head Roll None   None  L Sidelying Test      R Sidelying Test      (blank = not tested)  mCTSIB: 5.4s  Functional Outcome Measures  05/30/23 06/07/23 Comments  BERG 49/56    DGI  17/24   FGA     TUG 19.7 seconds    5TSTS 13.0 seconds    6 Minute Walk Test     10 Meter  Gait Speed Self-selected: 15.2s = 0.66 m/s; Fastest: 11.8s = 0.85 m/s    (Blank rows = not  tested)  Coordination/Cerebellar Testing Finger to Nose: Dysmetria BUE but difficult to assess secondary to tremor; Heel to Shin: WNL Rapid alternating movements: WNL Finger Opposition: WNL Pronator Drift: Negative  Functional Outcome Measures 08/28/23  05/30/23 06/07/23 06/09/23 07/12/23 08/02/23 8/25/254  BERG 49/56   52/56 53/56 48/56   DGI  17/24    18/24  FGA        TUG 19.7 seconds   13.51 seconds 12.0s 10.8s  5TSTS 13.0 seconds   11.30 seconds 10.8s 10.6s  6 Minute Walk Test   916'   856'  10 Meter Gait Speed Self-selected: 15.2s = 0.66 m/s; Fastest: 11.8s = 0.85 m/s   Self-Selected: 13.46= 0.74 m/s; Fastest: 11.13 = 0.90 m/s Self-Selected: 13.7s =  0.73 m/s; Fastest:  10.4s =  0.96 m/s Self-Selected: 13.6s =  0.74 m/s; Fastest:  10.9s =  0.92 m/s  (Blank rows = not tested)   TODAY'S TREATMENT: 10/04/2023    SUBJECTIVE: Pt states that he is doing well today.  No falls since the last therapy session and no significant stumbles. Reports ongoing chronic low back pain. No specific questions or concerns. He is ready for discharge today.    PAIN: chronic low back pain   Therapeutic Activity  Functional Outcome Measures 10/04/23  05/30/23 06/07/23 06/09/23 07/12/23 08/02/23 8/25/254 10/04/23 Comments  BERG 49/56   52/56 53/56 48/56  53/56 WNL  DGI  17/24    18/24 22/24 WNL  FGA          TUG 19.7 seconds   13.51 seconds 12.0s 10.8s 11.8s WNL  5TSTS 13.0 seconds   11.30 seconds 10.8s 10.6s 10.8s WNL  6 Minute Walk Test   916'   856' 964' Below cut-off  10 Meter Gait Speed Self-selected: 15.2s = 0.66 m/s; Fastest: 11.8s = 0.85 m/s   Self-Selected: 13.46= 0.74 m/s; Fastest: 11.13 = 0.90 m/s Self-Selected: 13.7s =  0.73 m/s; Fastest:  10.4s =  0.96 m/s Self-Selected: 13.6s =  0.74 m/s; Fastest:  10.9s =  0.92 m/s Self-Selected: 13.7s =  0.73 m/s; Fastest: 9.1s =  1.1 m/s Below full community ambulation speed  (Blank rows = not tested)  ABC: 80.6%;  Pre-vitals: Seated: BP: 166/86 mmHg,  HR: 61 bpm, SpO2: 98% Post-vitals: Seated: BP: 188/83 mmHg, HR: 68 bpm, SpO2: 91% (6/10 on BORG);  Discharge education with updated HEP provided;   PATIENT EDUCATION:  Education details: Outcome measures/goals, discharge, updated HEP Person educated: Patient Education method: Explanation, Demonstration, Verbal cues, and Handouts Education comprehension: verbalized understanding   HOME EXERCISE PROGRAM:  Access Code: [email or web address] URL: [email or web address] Date: 10/04/2023 Prepared by: Selinda Eck  Exercises - Sit to Stand Without Arm Support  - 1 x daily - 3 x weekly - 3 sets - 10 reps - Squat at Table  - 1 x daily - 3 x weekly - 3 sets - 10 reps - Standing Hip Abduction with Counter Support  - 1 x daily - 3 x weekly - 3 sets - 10 reps - Standing Hip Abduction with Counter Support (Mirrored)  - 1 x daily - 3 x weekly - 3 sets - 10 reps - Heel Raises with Counter Support  - 1 x daily - 3 x weekly - 3 sets - 10 reps - Standing Romberg to 1/2 Tandem Stance  - 1 x daily - 7 x weekly - 3 reps - 30s  hold - Standing Romberg to 1/2 Tandem Stance (Mirrored)  - 1 x daily - 7 x weekly - 3 reps - 30s hold - Single Leg Stance  - 1 x daily - 7 x weekly - 3 reps - 30s hold - Single Leg Stance (Mirrored)  - 1 x daily - 7 x weekly - 3 reps - 30s hold  ASSESSMENT:  CLINICAL IMPRESSION: Updated goals and outcome measures this session with patient. BERG score improved from 49/56 at the initial evaluation to 53/56 today. His TUG score continues to show improvement from 19.7 seconds initially to 11.8 seconds today and his 5TSTS improved from 13.0s to 10.8s. His ABC score improved from 62.2% at the initial evaluation to 80.6% today. His 33m self-selected and fastest speeds have both improved. However, his gait speed is still below necessary relevant speed for full community mobility. improved from 916' to 63' today which is not a statistically meaningful improvement. Pt is ready for  discharge at this time. Pt encouraged to continue his HEP.   OBJECTIVE IMPAIRMENTS: Abnormal gait, decreased balance, difficulty walking, decreased strength, and dizziness.   ACTIVITY LIMITATIONS: standing, stairs, transfers, and caring for others  PARTICIPATION LIMITATIONS: meal prep, cleaning, laundry, shopping, and community activity  PERSONAL FACTORS: Age, Past/current experiences, Time since onset of injury/illness/exacerbation, and 3+ comorbidities: back pain, tremors, anxiety, depression are also affecting patient's functional outcome.   REHAB POTENTIAL: Good  CLINICAL DECISION MAKING: Unstable/unpredictable  EVALUATION COMPLEXITY: High   GOALS: Goals reviewed with patient? No  SHORT TERM GOALS: Target date:  Pt will be independent with HEP in order to improve strength and balance in order to decrease fall risk and improve function at home. Baseline:  Goal status: MET   LONG TERM GOALS: Target date: 10/23/2023   Pt will improve ABC by at least 13% in order to demonstrate clinically significant improvement in balance confidence.  Baseline: 62.2%, 07/12/23: 70%; 08/02/23: 45.6%; 08/28/23: 66.6%; 10/04/23: 80.6% Goal status: MET  2.  Pt will improve BERG by at least 3 points in order to demonstrate clinically significant improvement in balance.   Baseline: 49/56; 07/12/23: 52/56; 08/02/23: 46/43; 08/28/23: 48/56; 10/04/23: 53/56; Goal status: MET  3.  Pt will decrease TUG to below 14 seconds/decrease in order to demonstrate decreased fall risk.       Baseline: 19.7s, 07/12/23: 13.51 sec; 08/02/23: 12.0s; 08/28/23: 10.8s; 10/04/23: 11.8s Goal status: MET  4. Pt will increase self-selected 57m gait speed to at least 0.80 m/s in order to demonstrate clinically significant improvement in community ambulation.     Baseline: 0.66 m/s, 07/12/23: 0.74 m/s; 08/02/23: 0.73 m/s; 08/28/23: 13.6s =  0.74 m/s; 10/04/23: 13.7s =  0.73 m/s; Goal status: PARTIALLY MET  5. Pt will increase by at  least 19m (122ft) in order to demonstrate clinically significant improvement in cardiopulmonary endurance and community ambulation      Baseline: 06/09/23: 916'; 08/02/23: Deferred; 08/28/23: 856'; 10/04/23: 964'; Goal status: PARTIALLY MET  PLAN: PT FREQUENCY: 2x/week  PT DURATION: 8 weeks  PLANNED INTERVENTIONS: Therapeutic exercises, Therapeutic activity, Neuromuscular re-education, Balance training, Gait training, Patient/Family education, Self Care, Joint mobilization, Joint manipulation, Vestibular training, Canalith repositioning, Orthotic/Fit training, DME instructions, Dry Needling, Electrical stimulation, Spinal manipulation, Spinal mobilization, Cryotherapy, Moist heat, Taping, Traction, Ultrasound, Ionotophoresis 4mg /ml Dexamethasone , Manual therapy, and Re-evaluation.  PLAN FOR NEXT SESSION: Discharge  Selinda JONETTA Eck PT, DPT, GCS  10/04/2023, 2:06 PM

## 2023-10-09 ENCOUNTER — Ambulatory Visit

## 2023-10-11 ENCOUNTER — Ambulatory Visit

## 2023-10-16 ENCOUNTER — Encounter

## 2023-10-18 ENCOUNTER — Encounter

## 2023-10-23 ENCOUNTER — Encounter

## 2023-10-25 ENCOUNTER — Encounter

## 2023-10-30 ENCOUNTER — Encounter

## 2023-11-01 ENCOUNTER — Encounter

## 2023-11-06 NOTE — Progress Notes (Signed)
 Wesley Preston is a  83 y.o. male who presents for  CHIEF COMPLAINT Chief Complaint  Patient presents with  . Annual Exam    Subjective: History of Present Illness Pt in NAD. Here for yearly eval. HTN stable on meds. CM followed by Cardiology. Has HLD not on statin and B12 def on shots. Weight stable.  Some balance issues due to vision issues. Still with nocturia. No fever. Denies CP or SOB. No palpitations. No change in bowels or bladder. Recent labs stable.   Past Medical History:  Diagnosis Date  . Anxiety   . Arthritis of carpometacarpal (CMC) joint of left thumb   . Basal cell carcinoma   . BPH (benign prostatic hypertrophy)    prostatitis  . Chest pain, non-cardiac    with a negative Stress Thallium in January of 97.    . Colon polyps    multiple, adenomatous  . COPD (chronic obstructive pulmonary disease) (CMS/HHS-HCC)   . Depression   . Essential hypertension, benign   . Essential tremor   . History of Helicobacter pylori infection    gastritis  . Mechanical back pain   . Other and unspecified hyperlipidemia   . Reactive airway disease (HHS-HCC)   . Restless leg syndrome   . Tremor   . Trigger thumb of both hands    Patient Active Problem List  Diagnosis  . Essential hypertension, benign  . Pure hypercholesterolemia  . BPH associated with nocturia  . Essential tremor  . History of Helicobacter pylori infection  . Reactive airway disease (HHS-HCC)  . Anxiety  . Depression  . Mechanical back pain  . Restless leg syndrome  . Unilateral inguinal hernia without obstruction or gangrene  . Tremor  . Imbalance  . Dizziness  . Visual disturbance  . Trigger finger of left thumb  . Hand pain, left  . BPH with obstruction/lower urinary tract symptoms  . NICM (nonischemic cardiomyopathy) (CMS/HHS-HCC)  . Panlobular emphysema (CMS/HHS-HCC)  . B12 deficiency    Past Surgical History:  Procedure Laterality Date  . history of sinus surgery   11/1995  . COLONOSCOPY   03/21/2016   Tubulovillous Adenoma x 2: CBF 06/2016; Recall Ltr mailed 05/11/2016 (dw); Sch'ed 08/01/2016; See msg 07/14/2016 & postponed per RTE  . COLONOSCOPY  03/09/2018   Adenomatous Polyp: CBF 03/2021  . EGD  03/09/2018   Duodenitis; Gastritis: No repeat per RTE  . basal cell carcinoma removal     Completed in February 2025, located on left side of face.  . COLONOSCOPY  09/12/2011, 01/10/2011, 10/31/2005, 10/11/2001, 09/15/1998, 06/09/1993    Adenomatous Polyps: CBF 09/2014; OV made 01/13/2016 w/Michelle Johnson PA (dw)  . EGD  12/12/2011, 10/11/2001, 09/15/1998, 06/09/1993   No repeat per RTE  . history of nasal polypectomy    . trigger finger release Right    thumb     Current Outpatient Medications:  .  acetaminophen  (TYLENOL ) 500 MG tablet, Take 1,000 mg by mouth every 8 (eight) hours as needed for Pain, Disp: , Rfl:  .  ANORO ELLIPTA  62.5-25 mcg/actuation inhaler, USE 1 INHALATION BY MOUTH ONCE  DAILY AT THE SAME TIME EACH DAY, Disp: 180 each, Rfl: 3 .  brimonidine (ALPHAGAN) 0.2 % ophthalmic solution, INSTILL 1 DROP INTO AFFECTED EYE TWICE DAILY, Disp: , Rfl:  .  cyanocobalamin (VITAMIN B12) 1000 MCG tablet, Take 1,000 mcg by mouth once daily, Disp: , Rfl:  .  gabapentin (NEURONTIN) 100 MG capsule, Take 1 capsule (100 mg total) by mouth 3 (  three) times daily, Disp: 270 capsule, Rfl: 2 .  latanoprost (XALATAN) 0.005 % ophthalmic solution, INSTILL 1 DROP INTO RIGHT EYE ONCE DAILY AT NIGHT, Disp: , Rfl:  .  primidone (MYSOLINE) 50 MG tablet, TAKE 2 TABLETS BY MOUTH IN THE  MORNING AND 1 TABLET BY MOUTH IN THE AFTERNOON AND 1 TABLET BY  MOUTH AT NIGHT, Disp: 360 tablet, Rfl: 3 .  propranoloL (INDERAL) 20 MG tablet, TAKE 1 TABLET BY MOUTH 3 TIMES  DAILY, Disp: 270 tablet, Rfl: 3 .  dorzolamide (TRUSOPT) 2 % ophthalmic solution, PLACE 1 DROP INTO THE RIGHT EYE TWICE DAILY, Disp: , Rfl:   Others, Shellfish containing products, and Shellfish derived  Social History   Socioeconomic History   . Marital status: Married  Tobacco Use  . Smoking status: Former    Types: Cigarettes  . Smokeless tobacco: Never  Vaping Use  . Vaping status: Never Used  Substance and Sexual Activity  . Alcohol use: No    Alcohol/week: 0.0 standard drinks of alcohol  . Drug use: No  . Sexual activity: Defer   Social Drivers of Health   Financial Resource Strain: Low Risk  (11/06/2023)   Overall Financial Resource Strain (CARDIA)   . Difficulty of Paying Living Expenses: Not hard at all  Food Insecurity: No Food Insecurity (11/06/2023)   Hunger Vital Sign   . Worried About Programme Researcher, Broadcasting/film/video in the Last Year: Never true   . Ran Out of Food in the Last Year: Never true  Transportation Needs: No Transportation Needs (11/06/2023)   PRAPARE - Transportation   . Lack of Transportation (Medical): No   . Lack of Transportation (Non-Medical): No  Housing Stability: Low Risk  (11/06/2023)   Housing Stability Vital Sign   . Unable to Pay for Housing in the Last Year: No   . Number of Times Moved in the Last Year: 0   . Homeless in the Last Year: No    Family History  Problem Relation Name Age of Onset  . Parkinsonism Mother    . Tremor Mother    . Heart failure Father      A comprehensive ROS was negative except for HPI  PE: BP 126/74   Pulse 71   Ht 172.7 cm (5' 8)   Wt 80.7 kg (178 lb)   SpO2 95%   BMI 27.06 kg/m  General. Alert oriented x3  Skin. No suspicious lesions or moles.   Eyes. Sclera and conjunctiva clear; pupils equal round and reactive to light and accommodation; extraocular movements intact Ears. External normal; canals clear; tympanic membranes normal Nose. Mucosa healthy without drainage or ulceration Oropharynx. No suspicious lesions Neck. No swelling, masses, stiffness, pain, limited movement, carotid pulses normal bilaterally, thyroid normal size, no masses palpated.  No bruits Lungs. Respirations unlabored; clear to auscultation bilaterally Back. No spinal  deformity Cardiovascular. Heart regular rate and rhythm without murmurs, gallops, or rubs Abdomen. Soft; non tender; non distended; normoactive bowel sounds; no masses or organomegaly Lymph Nodes. No significant cervical, supraclavicular, axillary or inguinal lymphadenopathy noted Musculoskeletal. No deformities; no active joint inflammation Extremities. Normal, no edema Pulses. Dorsalis pedis palpable and symmetric bilaterally Neurologic. Alert and oriented; speech intact; face symmetrical; moves all extremities well  Goals     . Maintain health/healthy lifestyle    . Reduce sodium intake to X miligrams per day        Appointment on 11/01/2023  Component Date Value Ref Range Status  . WBC (White Blood  Cell Count) 11/01/2023 8.5  4.1 - 10.2 10^3/uL Final  . RBC (Red Blood Cell Count) 11/01/2023 5.14  4.69 - 6.13 10^6/uL Final  . Hemoglobin 11/01/2023 15.6  14.1 - 18.1 gm/dL Final  . Hematocrit 89/70/7974 48.3  40.0 - 52.0 % Final  . MCV (Mean Corpuscular Volume) 11/01/2023 94.0  80.0 - 100.0 fl Final  . MCH (Mean Corpuscular Hemoglobin) 11/01/2023 30.4  27.0 - 31.2 pg Final  . MCHC (Mean Corpuscular Hemoglobin * 11/01/2023 32.3  32.0 - 36.0 gm/dL Final  . Platelet Count 11/01/2023 279  150 - 450 10^3/uL Final  . RDW-CV (Red Cell Distribution Widt* 11/01/2023 13.2  11.6 - 14.8 % Final  . MPV (Mean Platelet Volume) 11/01/2023 9.4  9.4 - 12.4 fl Final  . Neutrophils 11/01/2023 4.53  1.50 - 7.80 10^3/uL Final  . Lymphocytes 11/01/2023 2.43  1.00 - 3.60 10^3/uL Final  . Monocytes 11/01/2023 1.05  0.00 - 1.50 10^3/uL Final  . Eosinophils 11/01/2023 0.34  0.00 - 0.55 10^3/uL Final  . Basophils 11/01/2023 0.08  0.00 - 0.09 10^3/uL Final  . Neutrophil % 11/01/2023 53.5  32.0 - 70.0 % Final  . Lymphocyte % 11/01/2023 28.7  10.0 - 50.0 % Final  . Monocyte % 11/01/2023 12.4  4.0 - 13.0 % Final  . Eosinophil % 11/01/2023 4.0  1.0 - 5.0 % Final  . Basophil% 11/01/2023 0.9  0.0 - 2.0 % Final   . Immature Granulocyte % 11/01/2023 0.5  <=0.7 % Final  . Immature Granulocyte Count 11/01/2023 0.04  <=0.06 10^3/L Final  . Glucose 11/01/2023 91  70 - 110 mg/dL Final  . Sodium 89/70/7974 140  136 - 145 mmol/L Final  . Potassium 11/01/2023 4.4  3.6 - 5.1 mmol/L Final  . Chloride 11/01/2023 103  97 - 109 mmol/L Final  . Carbon Dioxide (CO2) 11/01/2023 33.1 (H)  22.0 - 32.0 mmol/L Final  . Urea Nitrogen (BUN) 11/01/2023 17  7 - 25 mg/dL Final  . Creatinine 89/70/7974 0.8  0.7 - 1.3 mg/dL Final  . Glomerular Filtration Rate (eGFR) 11/01/2023 87  >60 mL/min/1.73sq m Final  . Calcium 11/01/2023 9.2  8.7 - 10.3 mg/dL Final  . AST  89/70/7974 16  8 - 39 U/L Final  . ALT  11/01/2023 15  6 - 57 U/L Final  . Alk Phos (alkaline Phosphatase) 11/01/2023 68  34 - 104 U/L Final  . Albumin 11/01/2023 4.2  3.5 - 4.8 g/dL Final  . Bilirubin, Total 11/01/2023 0.5  0.3 - 1.2 mg/dL Final  . Protein, Total 11/01/2023 7.1  6.1 - 7.9 g/dL Final  . A/G Ratio 89/70/7974 1.4  1.0 - 5.0 gm/dL Final  . Cholesterol, Total 11/01/2023 181  100 - 200 mg/dL Final  . Triglyceride 89/70/7974 99  35 - 199 mg/dL Final  . HDL (High Density Lipoprotein) Cho* 11/01/2023 50.4  29.0 - 71.0 mg/dL Final  . LDL Calculated 11/01/2023 888  0 - 130 mg/dL Final  . VLDL Cholesterol 11/01/2023 20  mg/dL Final  . Cholesterol/HDL Ratio 11/01/2023 3.6   Final  . Thyroid Stimulating Hormone (TSH) 11/01/2023 1.542  0.450-5.330 uIU/ml uIU/mL Final  . Vitamin B12 11/01/2023 650  >300 pg/mL Final  . Color 11/01/2023 Yellow  Colorless, Straw, Light Yellow, Yellow, Dark Yellow Final  . Clarity 11/01/2023 Clear  Clear Final  . Specific Gravity 11/01/2023 1.010  1.000 - 1.030 Final  . pH, Urine 11/01/2023 7.0  5.0 - 8.0 Final  . Protein, Urinalysis 11/01/2023 Negative  Negative, Trace mg/dL Final  . Glucose, Urinalysis 11/01/2023 Negative  Negative mg/dL Final  . Ketones, Urinalysis 11/01/2023 Negative  Negative mg/dL Final  . Blood,  Urinalysis 11/01/2023 Negative  Negative Final  . Nitrite, Urinalysis 11/01/2023 Negative  Negative Final  . Leukocyte Esterase, Urinalysis 11/01/2023 Negative  Negative Final  . White Blood Cells, Urinalysis 11/01/2023 None Seen  None Seen, 0-3 /hpf Final  . Red Blood Cells, Urinalysis 11/01/2023 None Seen  None Seen, 0-3 /hpf Final  . Bacteria, Urinalysis 11/01/2023 None Seen  None Seen /hpf Final  . Squamous Epithelial Cells, Urinaly* 11/01/2023 None Seen  Rare, Few, None Seen /hpf Final  Appointment on 04/27/2023  Component Date Value Ref Range Status  . Cholesterol, Total 04/27/2023 176  100 - 200 mg/dL Final  . Triglyceride 95/75/7974 80  35 - 199 mg/dL Final  . HDL (High Density Lipoprotein) Cho* 04/27/2023 49.5  29.0 - 71.0 mg/dL Final  . LDL Calculated 04/27/2023 888  0 - 130 mg/dL Final  . VLDL Cholesterol 04/27/2023 16  mg/dL Final  . Cholesterol/HDL Ratio 04/27/2023 3.6   Final  . WBC (White Blood Cell Count) 04/27/2023 8.3  4.1 - 10.2 10^3/uL Final  . RBC (Red Blood Cell Count) 04/27/2023 4.95  4.69 - 6.13 10^6/uL Final  . Hemoglobin 04/27/2023 15.2  14.1 - 18.1 gm/dL Final  . Hematocrit 95/75/7974 46.5  40.0 - 52.0 % Final  . MCV (Mean Corpuscular Volume) 04/27/2023 93.9  80.0 - 100.0 fl Final  . MCH (Mean Corpuscular Hemoglobin) 04/27/2023 30.7  27.0 - 31.2 pg Final  . MCHC (Mean Corpuscular Hemoglobin * 04/27/2023 32.7  32.0 - 36.0 gm/dL Final  . Platelet Count 04/27/2023 265  150 - 450 10^3/uL Final  . RDW-CV (Red Cell Distribution Widt* 04/27/2023 13.2  11.6 - 14.8 % Final  . MPV (Mean Platelet Volume) 04/27/2023 9.8  9.4 - 12.4 fl Final  . Neutrophils 04/27/2023 4.25  1.50 - 7.80 10^3/uL Final  . Lymphocytes 04/27/2023 2.45  1.00 - 3.60 10^3/uL Final  . Monocytes 04/27/2023 1.09  0.00 - 1.50 10^3/uL Final  . Eosinophils 04/27/2023 0.43  0.00 - 0.55 10^3/uL Final  . Basophils 04/27/2023 0.08  0.00 - 0.09 10^3/uL Final  . Neutrophil % 04/27/2023 50.9  32.0 - 70.0 %  Final  . Lymphocyte % 04/27/2023 29.4  10.0 - 50.0 % Final  . Monocyte % 04/27/2023 13.1 (H)  4.0 - 13.0 % Final  . Eosinophil % 04/27/2023 5.2 (H)  1.0 - 5.0 % Final  . Basophil% 04/27/2023 1.0  0.0 - 2.0 % Final  . Immature Granulocyte % 04/27/2023 0.4  <=0.7 % Final  . Immature Granulocyte Count 04/27/2023 0.03  <=0.06 10^3/L Final  . Glucose 04/27/2023 83  70 - 110 mg/dL Final  . Sodium 95/75/7974 141  136 - 145 mmol/L Final  . Potassium 04/27/2023 4.5  3.6 - 5.1 mmol/L Final  . Chloride 04/27/2023 104  97 - 109 mmol/L Final  . Carbon Dioxide (CO2) 04/27/2023 27.2  22.0 - 32.0 mmol/L Final  . Urea Nitrogen (BUN) 04/27/2023 18  7 - 25 mg/dL Final  . Creatinine 95/75/7974 0.8  0.7 - 1.3 mg/dL Final  . Glomerular Filtration Rate (eGFR) 04/27/2023 88  >60 mL/min/1.73sq m Final  . Calcium 04/27/2023 9.3  8.7 - 10.3 mg/dL Final  . AST  95/75/7974 19  8 - 39 U/L Final  . ALT  04/27/2023 18  6 - 57 U/L Final  . Alk Phos (alkaline Phosphatase)  04/27/2023 59  34 - 104 U/L Final  . Albumin 04/27/2023 4.2  3.5 - 4.8 g/dL Final  . Bilirubin, Total 04/27/2023 0.6  0.3 - 1.2 mg/dL Final  . Protein, Total 04/27/2023 7.1  6.1 - 7.9 g/dL Final  . A/G Ratio 95/75/7974 1.4  1.0 - 5.0 gm/dL Final  . PSA (Prostate Specific Antigen), T* 04/27/2023 0.27  0.10 - 4.00 ng/mL Final  . Thyroid Stimulating Hormone (TSH) 04/27/2023 1.236  0.450-5.330 uIU/ml uIU/mL Final  . Color 04/27/2023 Yellow  Colorless, Straw, Light Yellow, Yellow, Dark Yellow Final  . Clarity 04/27/2023 Clear  Clear Final  . Specific Gravity 04/27/2023 1.020  1.000 - 1.030 Final  . pH, Urine 04/27/2023 6.0  5.0 - 8.0 Final  . Protein, Urinalysis 04/27/2023 Negative  Negative, Trace mg/dL Final  . Glucose, Urinalysis 04/27/2023 Negative  Negative mg/dL Final  . Ketones, Urinalysis 04/27/2023 Negative  Negative mg/dL Final  . Blood, Urinalysis 04/27/2023 Negative  Negative Final  . Nitrite, Urinalysis 04/27/2023 Negative  Negative Final   . Leukocyte Esterase, Urinalysis 04/27/2023 Negative  Negative Final  . White Blood Cells, Urinalysis 04/27/2023 0-3  None Seen, 0-3 /hpf Final  . Red Blood Cells, Urinalysis 04/27/2023 None Seen  None Seen, 0-3 /hpf Final  . Bacteria, Urinalysis 04/27/2023 None Seen  None Seen /hpf Final  . Squamous Epithelial Cells, Urinaly* 04/27/2023 None Seen  Rare, Few, None Seen /hpf Final  . Vitamin B12 04/27/2023 586  >300 pg/mL Final   DIAGNOSIS: Annual physical exam  (primary encounter diagnosis)  Depression screening (Z13.31) Plan: Depression Screen -(PHQ- 2/9, BDI)  Essential hypertension, benign  NICM (nonischemic cardiomyopathy) (CMS/HHS-HCC)  Panlobular emphysema (CMS/HHS-HCC)  Pure hypercholesterolemia  Anxiety  B12 deficiency  BPH with obstruction/lower urinary tract symptoms   PLAN: Trial of Proscar. F/u with Neurology. Evonne cards given. RTC 6 mo, sooner if needed     Attestation Statement:   I personally performed the service. (TP)  Reyes JONETTA Costa, MD, MD

## 2023-11-28 ENCOUNTER — Emergency Department
Admission: EM | Admit: 2023-11-28 | Discharge: 2023-11-28 | Disposition: A | Discharge status: Home / Self Care | Attending: Emergency Medicine | Admitting: Emergency Medicine

## 2023-11-28 ENCOUNTER — Other Ambulatory Visit: Payer: Self-pay

## 2023-11-28 ENCOUNTER — Telehealth: Payer: Self-pay

## 2023-11-28 DIAGNOSIS — R339 Retention of urine, unspecified: Secondary | ICD-10-CM | POA: Diagnosis present

## 2023-11-28 DIAGNOSIS — I10 Essential (primary) hypertension: Secondary | ICD-10-CM | POA: Diagnosis not present

## 2023-11-28 DIAGNOSIS — R338 Other retention of urine: Secondary | ICD-10-CM

## 2023-11-28 DIAGNOSIS — J449 Chronic obstructive pulmonary disease, unspecified: Secondary | ICD-10-CM | POA: Diagnosis not present

## 2023-11-28 LAB — URINALYSIS, ROUTINE W REFLEX MICROSCOPIC
Bacteria, UA: NONE SEEN
Bilirubin Urine: NEGATIVE
Glucose, UA: NEGATIVE mg/dL
Ketones, ur: NEGATIVE mg/dL
Leukocytes,Ua: NEGATIVE
Nitrite: NEGATIVE
Protein, ur: NEGATIVE mg/dL
Specific Gravity, Urine: 1.005 (ref 1.005–1.030)
Squamous Epithelial / HPF: 0 /HPF (ref 0–5)
pH: 6 (ref 5.0–8.0)

## 2023-11-28 LAB — CBC WITH DIFFERENTIAL/PLATELET
Abs Immature Granulocytes: 0.03 K/uL (ref 0.00–0.07)
Basophils Absolute: 0.1 K/uL (ref 0.0–0.1)
Basophils Relative: 1 %
Eosinophils Absolute: 0.2 K/uL (ref 0.0–0.5)
Eosinophils Relative: 2 %
HCT: 45.5 % (ref 39.0–52.0)
Hemoglobin: 14.8 g/dL (ref 13.0–17.0)
Immature Granulocytes: 0 %
Lymphocytes Relative: 17 %
Lymphs Abs: 1.9 K/uL (ref 0.7–4.0)
MCH: 30 pg (ref 26.0–34.0)
MCHC: 32.5 g/dL (ref 30.0–36.0)
MCV: 92.1 fL (ref 80.0–100.0)
Monocytes Absolute: 1.6 K/uL — ABNORMAL HIGH (ref 0.1–1.0)
Monocytes Relative: 15 %
Neutro Abs: 7.2 K/uL (ref 1.7–7.7)
Neutrophils Relative %: 65 %
Platelets: 281 K/uL (ref 150–400)
RBC: 4.94 MIL/uL (ref 4.22–5.81)
RDW: 13.1 % (ref 11.5–15.5)
WBC: 10.9 K/uL — ABNORMAL HIGH (ref 4.0–10.5)
nRBC: 0 % (ref 0.0–0.2)

## 2023-11-28 LAB — BASIC METABOLIC PANEL WITH GFR
Anion gap: 9 (ref 5–15)
BUN: 14 mg/dL (ref 8–23)
CO2: 29 mmol/L (ref 22–32)
Calcium: 9 mg/dL (ref 8.9–10.3)
Chloride: 105 mmol/L (ref 98–111)
Creatinine, Ser: 0.76 mg/dL (ref 0.61–1.24)
GFR, Estimated: >60 mL/min (ref >60)
Glucose, Bld: 96 mg/dL (ref 70–99)
Potassium: 4.1 mmol/L (ref 3.5–5.1)
Sodium: 143 mmol/L (ref 135–145)

## 2023-11-28 MED ORDER — SULFAMETHOXAZOLE-TRIMETHOPRIM 800-160 MG PO TABS: 1.0000 | ORAL_TABLET | Freq: Two times a day (BID) | ORAL | 0 refills | Status: AC

## 2023-11-28 NOTE — Discharge Instructions (Addendum)
 Keep the Foley catheter in until you follow-up with urology next week.  Take the Bactrim  as prescribed and finish the full course.  We talked to Dr. Twylla.  He advised that his office (which is the same practice as Dr. Francisca) will reach out to you for follow-up next week.  Return to the ER for clogging or other problem with the Foley catheter, leaking around the catheter, abdominal pain, flank pain, fever, blood in the urine, or any other new or worsening symptoms that concern you.

## 2023-11-28 NOTE — ED Triage Notes (Signed)
 Arrives c/o urinary retnetion. Unable to pass urine since yesterday.  States felt urine pressure.  Self catheterized self at around 1830 for large amount of urine Unable to urinate and again catheterized self this morning at 0430 for large amount.  Has not been able to void.  Patient has not self catheterized since 2021 and just happened to have a kit at the house.

## 2023-11-28 NOTE — ED Notes (Signed)
Placed leg bag on patient

## 2023-11-28 NOTE — ED Provider Notes (Signed)
 [facility] Provider Note    Event Date/Time   First MD Initiated Contact with Patient 11/28/23 1234     (approximate)   History   Urinary Retention   HPI  Wesley Preston. is a 83 y.o. male with a history of BPH, COPD, hypertension, who presents with urinary retention, acute onset last night.  The patient states that this last happened about 4 years ago.  He had a couple of catheters at home.  He performed a self catheterization last night with return of a large volume of urine.  He did it again this morning again with a large volume of urine.  He denies dysuria or hematuria.  He denies any fever or chills.  I reviewed the past medical records.  The patient's most recent outpatient counter was with Dr. Auston from internal medicine on 11/3 for an annual exam and follow-up of his chronic conditions.   Physical Exam   Triage Vital Signs: ED Triage Vitals  Encounter Vitals Group     BP 11/28/23 1220 126/67     Girls Systolic BP Percentile --      Girls Diastolic BP Percentile --      Boys Systolic BP Percentile --      Boys Diastolic BP Percentile --      Pulse Rate 11/28/23 1220 73     Resp 11/28/23 1220 16     Temp 11/28/23 1220 (!) 97.4 F (36.3 C)     Temp Source 11/28/23 1220 Oral     SpO2 11/28/23 1220 95 %     Weight 11/28/23 1221 184 lb 15.5 oz (83.9 kg)     Height --      Head Circumference --      Peak Flow --      Pain Score 11/28/23 1220 5     Pain Loc --      Pain Education --      Exclude from Growth Chart --     Most recent vital signs: Vitals:   11/28/23 1220  BP: 126/67  Pulse: 73  Resp: 16  Temp: (!) 97.4 F (36.3 C)  SpO2: 95%     General: Awake, no distress.  CV:  Good peripheral perfusion.  Resp:  Normal effort.  Abd:  Soft and nontender.  No distention.  Other:  No jaundice or scleral icterus.   ED Results / Procedures / Treatments   Labs (all labs ordered are listed, but only abnormal results are  displayed) Labs Reviewed  CBC WITH DIFFERENTIAL/PLATELET - Abnormal; Notable for the following components:      Result Value   WBC 10.9 (*)    Monocytes Absolute 1.6 (*)    All other components within normal limits  URINALYSIS, ROUTINE W REFLEX MICROSCOPIC - Abnormal; Notable for the following components:   Color, Urine YELLOW (*)    APPearance CLEAR (*)    Hgb urine dipstick MODERATE (*)    All other components within normal limits  URINE CULTURE  BASIC METABOLIC PANEL WITH GFR     EKG   RADIOLOGY   PROCEDURES:  Critical Care performed: No  Procedures   MEDICATIONS ORDERED IN ED: Medications - No data to display   IMPRESSION / MDM / ASSESSMENT AND PLAN / ED COURSE  I reviewed the triage vital signs and the nursing notes.  83 year old male with PMH as noted above presents with acute urinary retention since last night.  He has self catheterized twice but  is still unable to void on his own.  He has a history of frequent UTIs in the past and had been self catheterizing several years ago.  Urology note from 2021 documents that he had a HoLEP procedure and was noted to have a narrow urethra.  He developed a stricture postop and had several dilations.  He then started to self catheterize and developed UTIs.  Since that time he has not had to self catheterize.  Differential diagnosis includes, but is not limited to, acute urinary retention, BPH, urethral stricture, UTI.  We will place a Foley catheter, obtain urinalysis, basic labs to check the patient's creatinine, and reassess.  Patient's presentation is most consistent with acute complicated illness / injury requiring diagnostic workup.  ----------------------------------------- 2:18 PM on 11/28/2023 -----------------------------------------  Foley catheter was successfully placed with return of over 500 mL of urine.  BMP and CBC show no acute findings.  Urinalysis shows 6-10 RBCs and WBCs but no bacteria.  Culture has  been sent.  I consulted and discussed the case with Dr. Twylla who recommends starting the patient on empiric antibiotics and keeping the Foley catheter in.  He advised that the patient should follow-up with urology next week, and he will help arrange for this.  I counseled the patient on the results of the workup and plan of care and he is in agreement.  I gave strict return precautions, and he expressed understanding.  He is stable for discharge at this time.   FINAL CLINICAL IMPRESSION(S) / ED DIAGNOSES   Final diagnoses:  Acute urinary retention     Rx / DC Orders   ED Discharge Orders          Ordered    sulfamethoxazole -trimethoprim  (BACTRIM  DS) 800-160 MG tablet  2 times daily        11/28/23 1416             Note:  This document was prepared using Dragon voice recognition software and may include unintentional dictation errors.    Jacolyn Pae, MD 11/28/23 1419

## 2023-11-28 NOTE — ED Notes (Signed)
 ED Provider at bedside.

## 2023-11-28 NOTE — Telephone Encounter (Signed)
 Pts daughter Sonny called and was concerned father was in poss retention and might have a UTI. Because pt has not been seen in 4 years he needs to reestablish care before I can give medical advise because he is considered a new pt now. Daughter was aware of situation. I advised he needed to be seen in Urgent care or the ED for attention sooner than later. Daughter was in agreement.

## 2023-11-28 NOTE — ED Triage Notes (Signed)
 First nurse note: Pt to ED via POV from [facility]. [facility] reports sending pt over due not being able to urinate since early this morning. Pt self cathed at 0430. Pain 7/10.

## 2023-11-29 LAB — URINE CULTURE: Culture: NO GROWTH

## 2023-12-04 DIAGNOSIS — R339 Retention of urine, unspecified: Secondary | ICD-10-CM | POA: Insufficient documentation

## 2023-12-04 NOTE — Progress Notes (Unsigned)
 12/07/23 8:25 AM   Wesley Preston Wesley Preston 1940-05-18 [id number]   HPI: 83 y.o. male here for initial evaluation of urinary retention   ED Visit (11/28/23) - AUR, had performed self-CIC at home x2 , but ongoing retention and presented to ED   - indwelling catheter placed in ED w/ 500cc output  - 14 French two-way catheter in place today, draining light yellow urine  - He would very much prefer to have catheter removed  Hx of BPH, followed by Dr. Francisca in 2021  - s/p HoLEP in May 2021  - c/b fossa stricture requiring office dilation, self-calibration caths at home  - Hx of rUTI following surgery     PMH: Past Medical History:  Diagnosis Date   Anxiety    Back pain    BPH (benign prostatic hyperplasia)    Cancer (HCC)    BASAL CELL   Chest pain, non-cardiac    Colon polyps    Complication of anesthesia    anesthesia stays a long time   COPD (chronic obstructive pulmonary disease) (HCC)    Depression    Essential tremor    Essential tremor    Family history of adverse reaction to anesthesia    DAUGHTERS GET NAUSEATED   GERD (gastroesophageal reflux disease)    H/O   Helicobacter pylori (H. pylori) infection    Hyperlipidemia    Neuromuscular disorder (HCC)    TREMORS   Reactive airway disease    Reactive airway disease    Restless leg syndrome     Surgical History: Past Surgical History:  Procedure Laterality Date   COLONOSCOPY  2013   COLONOSCOPY WITH PROPOFOL  N/A 03/21/2016   Procedure: COLONOSCOPY WITH PROPOFOL ;  Surgeon: Lamar ONEIDA Holmes, MD;  Location: [facility] ENDOSCOPY;  Service: Endoscopy;  Laterality: N/A;   COLONOSCOPY WITH PROPOFOL  N/A 03/09/2018   Procedure: COLONOSCOPY WITH PROPOFOL ;  Surgeon: Holmes Lamar ONEIDA, MD;  Location: [facility] ENDOSCOPY;  Service: Endoscopy;  Laterality: N/A;   ESOPHAGOGASTRODUODENOSCOPY     ESOPHAGOGASTRODUODENOSCOPY N/A 03/09/2018   Procedure: ESOPHAGOGASTRODUODENOSCOPY (EGD);  Surgeon: Holmes Lamar ONEIDA, MD;  Location: [facility]  ENDOSCOPY;  Service: Endoscopy;  Laterality: N/A;   HERNIA REPAIR     HOLEP-LASER ENUCLEATION OF THE PROSTATE WITH MORCELLATION N/A 05/10/2019   Procedure: HOLEP-LASER ENUCLEATION OF THE PROSTATE WITH MORCELLATION;  Surgeon: Francisca Redell BROCKS, MD;  Location: [facility] ORS;  Service: Urology;  Laterality: N/A;   INGUINAL HERNIA REPAIR Right 08/01/2014   Procedure: RIGHT INGUINAL HERNIA  REPAIR WITH MESH ;  Surgeon: Reyes LELON Cota, MD;  Location: [facility] ORS;  Service: General;  Laterality: Right; with large Ultra Pro mesh   NASAL SINUS SURGERY     ROTATOR CUFF REPAIR     TRIGGER FINGER RELEASE      Family History: Family History  Problem Relation Age of Onset   Heart disease Father     Social History:  reports that he quit smoking about 35 years ago. His smoking use included cigarettes. He started smoking about 55 years ago. He has a 20 pack-year smoking history. He has never used smokeless tobacco. He reports current alcohol use of about 24.0 standard drinks of alcohol per week. He reports that he does not use drugs.      Physical Exam: BP (!) 152/82   Pulse 73   Ht 5' 10 (1.778 m)   Wt 184 lb (83.5 kg)   BMI 26.40 kg/m    Constitutional:  Alert and oriented, No acute distress. Cardiovascular:  No clubbing, cyanosis, or edema. Respiratory: Normal respiratory effort, no increased work of breathing. GI: Nondistended GU: 14 French two-way catheter in place draining light yellow urine, with light bag Skin: No rashes, bruises or suspicious lesions. Neurologic: Grossly intact, no focal deficits, moving all 4 extremities. Psychiatric: Normal mood and affect.  Laboratory Data:  Latest Reference Range & Units 05/06/19 10:26 11/28/23 12:42  Creatinine 0.61 - 1.24 mg/dL 9.29 9.23   Ucx (88/74/74, cath) = no growth   Pertinent Imaging: N/A    Assessment & Plan:    BPH with obstruction/lower urinary tract symptoms Assessment & Plan: Hx of BPH, followed by Dr. Francisca in 2021  - s/p  HoLEP in May 2021  - c/b fossa stricture requiring office dilation, self-calibration caths at home  - on finasteride monotherapy  Will evaluate as below for acute on chronic urinary retention  - add Flomax 0.4 mg qhs  Orders: -     Tamsulosin HCl; Take 1 capsule (0.4 mg total) by mouth daily.  Dispense: 90 capsule; Refill: 1  Urinary retention Assessment & Plan: AUR w/ indwelling Foley (placed 11/28/23)   Possible recurrence of fossa stricture vs BPH regrowth vs bladder hypocontractility  Foley removed this AM, return this afternoon for TOV  Start empiric Flomax 0.4 mg  Patient counseled on CIC, which he is familiar. He may need to arrange catheter supply as a back-up option in the interim. We provided a handful for 87F Coude in case of emergency If ongoing urinary retention - he will follow up with Dr. Francisca for office cystoscopy  Orders: -     Tamsulosin HCl; Take 1 capsule (0.4 mg total) by mouth daily.  Dispense: 90 capsule; Refill: 1      Penne Skye, MD 12/07/2023  [facility] [address] [place], [place] [address] [phone]

## 2023-12-04 NOTE — Assessment & Plan Note (Addendum)
 Hx of BPH, followed by Dr. Francisca in 2021  - s/p HoLEP in May 2021  - c/b fossa stricture requiring office dilation, self-calibration caths at home  Will evaluate as below for acute on chronic urinary retention

## 2023-12-04 NOTE — Assessment & Plan Note (Signed)
 AUR w/ indwelling Foley (placed 11/28/23)   Possible recurrence of fossa stricture vs BPH regrowth vs bladder hypocontractility  Foley removed this AM, return this afternoon for TOV  Start empiric Flomax 0.4 mg  Patient counseled on CIC, which he is familiar. He may need to arrange catheter supply as a back-up option in the interim. We provided a handful for 66F Coude in case of emergency If ongoing urinary retention - he will follow up with Dr. Francisca for office cystoscopy

## 2023-12-07 ENCOUNTER — Ambulatory Visit: Admitting: Physician Assistant

## 2023-12-07 ENCOUNTER — Ambulatory Visit: Admitting: Urology

## 2023-12-07 VITALS — BP 152/82 | HR 73 | Ht 70.0 in | Wt 184.0 lb

## 2023-12-07 DIAGNOSIS — N401 Enlarged prostate with lower urinary tract symptoms: Secondary | ICD-10-CM

## 2023-12-07 DIAGNOSIS — N138 Other obstructive and reflux uropathy: Secondary | ICD-10-CM

## 2023-12-07 DIAGNOSIS — R339 Retention of urine, unspecified: Secondary | ICD-10-CM

## 2023-12-07 LAB — BLADDER SCAN AMB NON-IMAGING

## 2023-12-07 MED ORDER — TAMSULOSIN HCL 0.4 MG PO CAPS: 0.4000 mg | ORAL_CAPSULE | Freq: Every day | ORAL | 1 refills | Status: DC

## 2023-12-07 NOTE — Progress Notes (Signed)
 Afternoon follow-up  Patient returned to clinic this afternoon for PVR. He has been able to urinate and is comfortable. PVR >329mL.  Results for orders placed or performed in visit on 12/07/23  BLADDER SCAN AMB NON-IMAGING   Collection Time: 12/07/23  2:30 PM  Result Value Ref Range   Scan Result >320ml     Voiding trial failed. Counseled patient to continue voiding spontaneously and start CIC once daily and on demand.  Follow up: Return in about 2 weeks (around 12/21/2023) for Cysto with Dr. Francisca.

## 2023-12-07 NOTE — Patient Instructions (Signed)
 For urgent issues, please call us  at [phone] and select the phone tree option for the triage nurse. You may be asked to leave a voicemail; we check this inbox throughout the day.  Cystoscopy Cystoscopy is a procedure that is used to help diagnose and sometimes treat conditions that affect the lower urinary tract. The lower urinary tract includes the bladder and the urethra. The urethra is the tube that drains urine from the bladder. Cystoscopy is done using a thin, tube-shaped instrument with a light and camera at the end (cystoscope). The cystoscope may be hard or flexible, depending on the goal of the procedure. The cystoscope is inserted through the urethra, into the bladder. Cystoscopy may be recommended if you have: Urinary tract infections that keep coming back. Blood in the urine (hematuria). An inability to control when you urinate (urinary incontinence) or an overactive bladder. Unusual cells found in a urine sample. A blockage in the urethra, such as a urinary stone. Painful urination. An abnormality in the bladder found during an intravenous pyelogram (IVP) or CT scan. What are the risks? Generally, this is a safe procedure. However, problems may occur, including: Infection. Bleeding.  What happens during the procedure?  You will be given one or more of the following: A medicine to numb the area (local anesthetic). The area around the opening of your urethra will be cleaned. The cystoscope will be passed through your urethra into your bladder. Germ-free (sterile) fluid will flow through the cystoscope to fill your bladder. The fluid will stretch your bladder so that your health care provider can clearly examine your bladder walls. Your doctor will look at the urethra and bladder. The cystoscope will be removed The procedure may vary among health care providers  What can I expect after the procedure? After the procedure, it is common to have: Some soreness or pain in your  urethra. Urinary symptoms. These include: Mild pain or burning when you urinate. Pain should stop within a few minutes after you urinate. This may last for up to a few days after the procedure. A small amount of blood in your urine for several days. Feeling like you need to urinate but producing only a small amount of urine. Follow these instructions at home: General instructions Return to your normal activities as told by your health care provider.  Drink plenty of fluids after the procedure. Keep all follow-up visits as told by your health care provider. This is important. Contact a health care provider if you: Have pain that gets worse or does not get better with medicine, especially pain when you urinate lasting longer than 72 hours after the procedure. Have trouble urinating. Get help right away if you: Have blood clots in your urine. Have a fever or chills. Are unable to urinate. Summary Cystoscopy is a procedure that is used to help diagnose and sometimes treat conditions that affect the lower urinary tract. Cystoscopy is done using a thin, tube-shaped instrument with a light and camera at the end. After the procedure, it is common to have some soreness or pain in your urethra. It is normal to have blood in your urine after the procedure.  If you were prescribed an antibiotic medicine, take it as told by your health care provider.  This information is not intended to replace advice given to you by your health care provider. Make sure you discuss any questions you have with your health care provider. Document Revised: 12/12/2017 Document Reviewed: 12/12/2017 Elsevier Patient Education  2020 Elsevier  Inc.

## 2023-12-07 NOTE — Progress Notes (Signed)
 Catheter Removal  Patient is present today for a catheter removal.  10ml of water was drained from the balloon. A 14FR foley cath was removed from the bladder, no complications were noted. Patient tolerated well.  Performed by: Myrtha Izaiyah Kleinman,CMA  Follow up/ Additional notes: Patient is to return to the clinic around 2pm to see Sam.

## 2023-12-11 ENCOUNTER — Telehealth: Payer: Self-pay | Admitting: Urology

## 2023-12-11 NOTE — Telephone Encounter (Signed)
 Patient called requesting to pick up catheters, as he only has one left. Patient's appointment for CYSTO is not until 12/26/23 and is voiding twice daily. He would like to get a response today, so he can come and pick them up in office. Please advise.

## 2023-12-11 NOTE — Telephone Encounter (Signed)
 Got a staff message that patient called asking for some more catheters. I left catheters at the front desk for him to pick up.-Larita Deremer,CMA

## 2023-12-12 ENCOUNTER — Ambulatory Visit: Admitting: Physician Assistant

## 2023-12-18 NOTE — Telephone Encounter (Signed)
 Pt called again stating he needed more catheters.  He is self cathing 4 x per day.

## 2023-12-19 ENCOUNTER — Telehealth: Payer: Self-pay

## 2023-12-19 NOTE — Telephone Encounter (Signed)
 Pts wife called in and I let them know they can come in and get some cath supply and that the order had been placed for him to start getting them from [facility]. His wife states they will come in sometime this week to get them.

## 2023-12-19 NOTE — Telephone Encounter (Signed)
 Pt called front desk to ask for more catheters. I called patient back but didn't get a response. I wanted out reach out to pt so he could pickup some more catheters in our clinic. I recently gave him 33fr coude catheters prior on 12/11/23 that pt picked up. To cover him per PA Samantha's note it said to have him cath once daily but pt was doing it 4 times daily which is why he has run out of catheters so quickly. Per Sam it is okay with her to have pt doing 4 times daily for catheters. I have put in a order for [facility] to send patient his own supply of catheters. Patient will need to pickup some more catheters in office to hold him until his Cysto with Dr.Sninsky on 12/26/23.- Prinston Kynard,CMA.

## 2023-12-26 ENCOUNTER — Other Ambulatory Visit: Admitting: Urology

## 2023-12-26 VITALS — BP 145/75 | HR 80 | Wt 176.0 lb

## 2023-12-26 DIAGNOSIS — N401 Enlarged prostate with lower urinary tract symptoms: Secondary | ICD-10-CM | POA: Diagnosis not present

## 2023-12-26 DIAGNOSIS — N138 Other obstructive and reflux uropathy: Secondary | ICD-10-CM | POA: Diagnosis not present

## 2023-12-26 DIAGNOSIS — N35919 Unspecified urethral stricture, male, unspecified site: Secondary | ICD-10-CM

## 2023-12-26 LAB — BLADDER SCAN AMB NON-IMAGING

## 2023-12-26 MED ORDER — LIDOCAINE HCL URETHRAL/MUCOSAL 2 % EX GEL
1.0000 | Freq: Once | CUTANEOUS | Status: AC
  Administered 2023-12-26: 1 via URETHRAL

## 2023-12-26 NOTE — Patient Instructions (Signed)
 On cystoscopy today you have a urethral stricture that is causing blockage and preventing complete emptying of your bladder.  The prostate area is wide open from your prior surgery and looks good.  The procedure would take about 10 minutes, you will be totally asleep and be able to be discharged the same day, very low risk of bleeding or infection.  Estimate 80% chance that you will be able to urinate on your own and not need to catheterize  Urethral Stricture  Urethral stricture is when the tube that drains pee (urine) from the bladder out of the body (urethra) becomes too narrow. The urethra can become narrow because of scar tissue, infection, surgery, or an injury. This can make it difficult to pee (urinate). In females, the urethra opens above the vaginal opening. In males, the urethra opens at the tip of the penis, and the urethra is much longer than it is in females. Because of the length of the male urethra, urethral stricture is much more common in males. What are the causes? In males and females, common causes of urethral stricture include: Urinary tract infection (UTI). Sexually transmitted infection (STI). Using a soft tube in the urethra to drain pee from the bladder (urinary catheter). Urinary tract surgery. In males, common causes of urethral stricture include: A severe injury to the pelvis. Prostate surgery. Injury to the penis. In many cases, the cause of urethral stricture is not known. What increases the risk? You are more likely to develop this condition if you: Are male. Males who have had prostate surgery are at risk of developing this condition. Use a urinary catheter. Have had urinary tract surgery. What are the signs or symptoms? The main symptom of this condition is trouble peeing. This may cause decreased pee flow, dribbling, or spraying of pee. Other symptom of this condition may include: Frequent UTIs. Blood in the pee. Pain when peeing. Swelling of the penis in  males. Not being able to pee. How is this diagnosed? This condition may be diagnosed based on: Your medical history and a physical exam. Tests of your pee to check for infection or bleeding. X-rays. Ultrasound. Retrograde urethrogram. With this test, a dye is injected into the urethra and then an X-ray is taken. Urethroscopy. This is when a thin tube with a light and camera on the end (urethroscope) is used to look at the urethra. A CT scan or MRI. How is this treated? This condition is treated with surgery or other procedures. The type of surgery that you have depends on the severity of your condition. You may have: Urethral dilation. In this procedure, the narrow part of the urethra is stretched open (dilated) with dilating instruments or a small balloon. Urethrotomy. In this procedure, a urethroscope is placed into the urethra, and the narrow part of the urethra is cut open with a surgical blade or laser inserted through the urethroscope. Urethroplasty. In this procedure, an incision is made in the urethra and the narrow part is removed. Then, the urethra is reconstructed. Follow these instructions at home: Take over-the-counter and prescription medicines only as told by your health care provider. If you were prescribed antibiotics, take them as told by your provider. Do not stop using the antibiotic even if you start to feel better. Drink enough fluid to keep your pee pale yellow. Keep all follow-up visits. Your provider will check your healing and adjust your treatment plan as needed. Contact a health care provider if: You have frequent peeing or you  are only peeing small amounts often. You feel the need to pee urgently. You have pain or burning when you pee. Your pee smells bad or unusual. Your pee is bloody or cloudy. You have pain in your lower abdomen or back. Your genital area is swollen, bruised, or discolored. This includes: The penis, scrotum, and inner thighs for  males. The outer genital organs (vulva) and inner thighs for females. You have a fever. You develop swelling in your legs. Get help right away if: You cannot pee. You have trouble breathing. These symptoms may be an emergency. Get help right away. Call 911. Do not wait to see if the symptoms will go away. Do not drive yourself to the hospital. This information is not intended to replace advice given to you by your health care provider. Make sure you discuss any questions you have with your health care provider. Document Revised: 10/14/2021 Document Reviewed: 10/14/2021 Elsevier Patient Education  2024 [employer].

## 2023-12-26 NOTE — Progress Notes (Signed)
 Cystoscopy Procedure Note:  Indication: Urinary retention, urethral stricture  Complex history-initially presented with incomplete bladder emptying and recurrent UTIs-cystoscopy showed diffuse mild narrowing of the urethra and enlarged prostate.  Underwent uncomplicated HOLEP May 2021 with removal of 43 g benign tissue.  Did have issues with fossa navicularis stricture postop that required self dilations, but had been voiding spontaneously for essentially the last 4 years.  Nocturia 2 times at night, occasional weak stream.  Developed Foley dependent retention in November, and voiding trial with elevated PVRs and he was started back on catheterizations 3-4 times daily.  Recent PSA normal at 0.2.  After informed consent and discussion of the procedure and its risks, Wesley Preston. was positioned and prepped in the standard fashion. Cystoscopy was performed with a flexible cystoscope.  The urethra was narrow, but there was an area of stricture approximately 10 French and 1 to 2 cm in length in the mid to proximal urethra, I was able to bypassed this with firm pressure on the scope.  The urethra, bladder neck and entire bladder was visualized in a standard fashion. The prostatic fossa was wide open from prior HOLEP, no bladder lesions, moderate bladder trabeculations, no abnormalities on retroflexion  Findings: Urethral stricture  -----------------------------------------------------------------------  Assessment and Plan: Complex history-initially presented with incomplete bladder emptying and recurrent UTIs-cystoscopy showed diffuse mild narrowing of the urethra and enlarged prostate.  Underwent uncomplicated HOLEP May 2021 with removal of 43 g benign tissue.  Did have issues with fossa navicularis stricture postop that required self dilations, but had been voiding spontaneously for essentially the last 4 years.  Nocturia 2 times at night, occasional weak stream.  Developed Foley dependent retention  in November, and voiding trial with elevated PVRs and he was started back on catheterizations 3-4 times daily.  Recent PSA normal at 0.2.  On cystoscopy today prostatic fossa wide open from prior HOLEP, but does have some persistent urethral stricture disease, I do think he would benefit and have improved emptying and likely be able to stop catheterizations with DVIU/Optilume balloon dilation.  Risks and benefits discussed including bleeding, infection, 20% recurrence risk, possible need for ongoing incomplete emptying secondary to atonic bladder that could require catheterization.  He would like to pursue all options to avoid catheterizations.  PVR today , he will continue to catheterize up to surgery date.  Schedule cystoscopy, Optilume balloon dilation  Wesley Burnet, MD 12/26/2023

## 2024-01-01 ENCOUNTER — Telehealth: Payer: Self-pay

## 2024-01-01 ENCOUNTER — Other Ambulatory Visit: Payer: Self-pay

## 2024-01-01 DIAGNOSIS — N35919 Unspecified urethral stricture, male, unspecified site: Secondary | ICD-10-CM

## 2024-01-01 NOTE — Progress Notes (Signed)
" ° °  [facility] Surgical Posting Form  Surgery Date: Date: 01/22/2024  Surgeon: Dr. Redell Burnet, MD  Inpt ( No  )   Outpt (Yes)   Obs ( No  )   Diagnosis: N35.919 Urethral Stricture  -CPT: 47715  Surgery: Cystoscopy with Urethral Balloon Dilation using Optilume  Stop Anticoagulations: Yes, may continue 81mg  ASA prior to surgery  Cardiac/Medical/Pulmonary Clearance needed: no  *Orders entered into EPIC  Date: 01/01/2024   *Case booked in [place]  Date: 01/01/2024  *Notified pt of Surgery: Date: 01/01/2024  PRE-OP UA & CX: yes, will obtain in clinic on 01/08/2024  *Placed into Prior Authorization Work [place] Date: 01/01/2024  Assistant/laser/rep:No                "

## 2024-01-01 NOTE — Progress Notes (Signed)
 Surgical Physician Order Form [facility]  Dr. Redell Burnet, MD  * Scheduling expectation : Next Available  *Length of Case: 30 minutes  *Clearance needed: no  *Anticoagulation Instructions: Hold all anticoagulants  *Aspirin Instructions: Can continue 81 mg aspirin if needed  *Post-op visit Date/Instructions:  1-3 day voiding trial, 4 weeks MD PVR  *Diagnosis: Urethral Stricture  *Procedure: Cystoscopy, Optilume balloon dilation   Additional orders: N/A  -Admit type: OUTpatient  -Anesthesia: General  -VTE Prophylaxis Standing Order SCD's       Other:   -Standing Lab Orders Per Anesthesia    Lab other: UA&Urine Culture  -Standing Test orders EKG/Chest x-ray per Anesthesia       Test other:   - Medications:  Ancef  2gm IV  -Other orders:  N/A

## 2024-01-01 NOTE — Telephone Encounter (Signed)
 Per Dr. Francisca, Patient is to be scheduled for Cystoscopy with Urethral Balloon Dilation using Optilume   Mr. Whitecotton and wife were contacted and possible surgical dates were discussed, Monday January 19th, 2026 was agreed upon for surgery.   Patient was instructed that Dr. Francisca will require them to provide a pre-op UA & CX prior to surgery. This was ordered and scheduled drop off appointment was made for 01/08/2024.    Patient was directed to call [phone] between 1-3pm the day before surgery to find out surgical arrival time.  Instructions were given not to eat or drink from midnight on the night before surgery and have a driver for the day of surgery. On the surgery day patient was instructed to enter through the [facility] entrance of [facility] report the Same Day Surgery desk.   Pre-Admit Testing will be in contact via phone to set up an interview with the anesthesia team to review your history and medications prior to surgery.   Reminder of this information was given to the patient while in clinic.

## 2024-01-08 ENCOUNTER — Other Ambulatory Visit

## 2024-01-08 DIAGNOSIS — N35919 Unspecified urethral stricture, male, unspecified site: Secondary | ICD-10-CM

## 2024-01-09 ENCOUNTER — Telehealth: Payer: Self-pay

## 2024-01-09 LAB — URINALYSIS, COMPLETE
Bilirubin, UA: NEGATIVE
Glucose, UA: NEGATIVE
Ketones, UA: NEGATIVE
Nitrite, UA: NEGATIVE
Protein,UA: NEGATIVE
RBC, UA: NEGATIVE
Specific Gravity, UA: 1.01 (ref 1.005–1.030)
Urobilinogen, Ur: 0.2 mg/dL (ref 0.2–1.0)
pH, UA: 6 (ref 5.0–7.5)

## 2024-01-09 LAB — MICROSCOPIC EXAMINATION

## 2024-01-09 NOTE — Telephone Encounter (Signed)
"  Mychart message sent  "

## 2024-01-09 NOTE — Telephone Encounter (Signed)
 Pt called the triage line reporting a burning sensation throughout the day. Patient states symptoms began after using a different type of catheter for self-catheterization. Pt is concerned he may have a UTI. Pt was informed that a urine culture was sent yesterday and that results typically take 3-5 days. Patient expressed concern regarding urinalysis results due to an upcoming surgery.

## 2024-01-10 ENCOUNTER — Ambulatory Visit: Payer: Self-pay | Admitting: Urology

## 2024-01-10 DIAGNOSIS — R3989 Other symptoms and signs involving the genitourinary system: Secondary | ICD-10-CM

## 2024-01-10 LAB — CULTURE, URINE COMPREHENSIVE

## 2024-01-10 MED ORDER — SULFAMETHOXAZOLE-TRIMETHOPRIM 800-160 MG PO TABS: 1.0000 | ORAL_TABLET | Freq: Two times a day (BID) | ORAL | 0 refills | Status: AC

## 2024-01-10 NOTE — Telephone Encounter (Signed)
 Called pt, wife answers. Informed wife of information below per Dr. Francisca. Wife on DPR. Wife voiced understanding. RX sent.

## 2024-01-10 NOTE — Telephone Encounter (Signed)
-----   Message from Redell Burnet, MD sent at 01/10/2024  9:55 AM EST ----- Preliminary urine culture is showing bacteria, lets go ahead and start Bactrim  DS twice daily x 7 days to sterilize urine, will change antibiotic if needed pending culture sensitivity results  Redell Burnet, MD 01/10/2024

## 2024-01-11 ENCOUNTER — Telehealth: Payer: Self-pay | Admitting: *Deleted

## 2024-01-11 MED ORDER — CIPROFLOXACIN HCL 500 MG PO TABS: 500.0000 mg | ORAL_TABLET | Freq: Two times a day (BID) | ORAL | 0 refills | Status: DC

## 2024-01-11 NOTE — Telephone Encounter (Signed)
 Spoke with pt. And patient's wife. Patient will need to stop Bactrim  and Start the Cipro . Cipro  sent to [facility] per patient request. Patient verbalized understanding.

## 2024-01-11 NOTE — Telephone Encounter (Signed)
 Patent wife called in today and states that the 16 cath are making him bleed. She wanted 14 coude luja  so . I put 10 of them up front for pick up. That is all we had in the back.

## 2024-01-11 NOTE — Addendum Note (Signed)
 Addended by: RUTHER SETTER A on: 01/11/2024 11:30 AM   Modules accepted: Orders

## 2024-01-15 ENCOUNTER — Encounter
Admission: RE | Admit: 2024-01-15 | Discharge: 2024-01-15 | Disposition: A | Discharge status: Home / Self Care | Source: Ambulatory Visit | Attending: Urology | Admitting: Urology

## 2024-01-15 ENCOUNTER — Telehealth: Payer: Self-pay

## 2024-01-15 ENCOUNTER — Other Ambulatory Visit: Payer: Self-pay

## 2024-01-15 VITALS — Ht 68.0 in | Wt 175.0 lb

## 2024-01-15 DIAGNOSIS — E7800 Pure hypercholesterolemia, unspecified: Secondary | ICD-10-CM

## 2024-01-15 DIAGNOSIS — I10 Essential (primary) hypertension: Secondary | ICD-10-CM

## 2024-01-15 NOTE — Telephone Encounter (Signed)
 Pt's wife LM on triage line requesting office email. Office email address sent to patient via mychart.

## 2024-01-15 NOTE — Patient Instructions (Addendum)
 Your procedure is scheduled on: Monday 01/22/24 To find out your arrival time, please call [phone] between 1PM - 3PM on: Friday 01/19/24 Report to the Registration Desk on the 1st floor of the [facility]. If your arrival time is 6:00 am, do not arrive before that time as the [facility] entrance doors do not open until 6:00 am.  REMEMBER: Instructions that are not followed completely may result in serious medical risk, up to and including death; or upon the discretion of your surgeon and anesthesiologist your surgery may need to be rescheduled.  Do not eat food or drink any liquids after midnight the night before surgery.  No gum chewing or hard candies.  One week prior to surgery: Stop Anti-inflammatories (NSAIDS) such as Advil, Aleve, Ibuprofen, Motrin, Naproxen, Naprosyn and Aspirin based products such as Excedrin, Goody's Powder, BC Powder.  You may however, continue to take Tylenol  if needed for pain up until the day of surgery.  Stop ANY OVER THE COUNTER supplements and vitamins for at least 7 days until after surgery.  Continue taking all of your other prescription medications up until the day of surgery.  ON THE DAY OF SURGERY ONLY TAKE THESE MEDICATIONS WITH SIPS OF WATER:  gabapentin (NEURONTIN) 100 MG capsule  primidone (MYSOLINE) 50 MG tablet  propranolol (INDERAL) 20 MG tablet  tamsulosin  (FLOMAX ) 0.4 MG CAPS capsule  You may use your nasal spray  Use inhalers on the day of surgery and bring to the hospital.  No Alcohol for 24 hours before or after surgery.  No Smoking including e-cigarettes for 24 hours before surgery.  No chewable tobacco products for at least 6 hours before surgery.  No nicotine patches on the day of surgery.  Do not use any recreational drugs for at least a week (preferably 2 weeks) before your surgery.  Please be advised that the combination of cocaine and anesthesia may have negative outcomes, up to and including death. If you test  positive for cocaine, your surgery will be cancelled.  On the morning of surgery brush your teeth with toothpaste and water, you may rinse your mouth with mouthwash if you wish. Do not swallow any toothpaste or mouthwash.  Shower prior to arrival  Do not shave body hair from the neck down 48 hours before surgery.  Do not wear lotions, powders, or perfumes on the day of surgery  Wear comfortable clothing (specific to your surgery type) to the hospital.  Do not wear jewelry, make-up, hairpins, clips or nail polish.  For welded (permanent) jewelry: bracelets, anklets, waist bands, etc.  Please have this removed prior to surgery.  If it is not removed, there is a chance that hospital personnel will need to cut it off on the day of surgery.  Contact lenses, hearing aids and dentures may not be worn into surgery.  Do not bring valuables to the hospital. [facility] is not responsible for any missing/lost belongings or valuables.   Notify your doctor if there is any change in your medical condition (cold, fever, infection).  After surgery, you can help prevent lung complications by doing breathing exercises.  Take deep breaths and cough every 1-2 hours. Your doctor may order a device called an Incentive Spirometer to help you take deep breaths.  If you are being discharged the day of surgery, you will not be allowed to drive home. You will need a responsible individual to drive you home and stay with you for 24 hours after surgery.  Please call the Pre-admissions Testing Dept. at [phone] if you have any questions about these instructions.  Surgery Visitation Policy:  Patients having surgery or a procedure may have two visitors.  Children under the age of 72 must have an adult with them who is not the patient.  Merchandiser, Retail to address health-related social needs:  [email or web address]

## 2024-01-16 ENCOUNTER — Inpatient Hospital Stay: Admission: RE | Admit: 2024-01-16 | Discharge: 2024-01-16 | Attending: Urology

## 2024-01-16 DIAGNOSIS — I10 Essential (primary) hypertension: Secondary | ICD-10-CM | POA: Diagnosis not present

## 2024-01-16 DIAGNOSIS — I4510 Unspecified right bundle-branch block: Secondary | ICD-10-CM | POA: Insufficient documentation

## 2024-01-16 DIAGNOSIS — Z01810 Encounter for preprocedural cardiovascular examination: Secondary | ICD-10-CM | POA: Insufficient documentation

## 2024-01-16 DIAGNOSIS — E7800 Pure hypercholesterolemia, unspecified: Secondary | ICD-10-CM | POA: Diagnosis not present

## 2024-01-22 ENCOUNTER — Encounter
Admission: RE | Disposition: A | Discharge status: Home / Self Care | Payer: Self-pay | Source: Ambulatory Visit | Attending: Urology

## 2024-01-22 ENCOUNTER — Encounter: Payer: Self-pay | Admitting: Urology

## 2024-01-22 ENCOUNTER — Ambulatory Visit: Admitting: Certified Registered"

## 2024-01-22 ENCOUNTER — Other Ambulatory Visit: Payer: Self-pay

## 2024-01-22 ENCOUNTER — Ambulatory Visit

## 2024-01-22 ENCOUNTER — Ambulatory Visit
Admission: RE | Admit: 2024-01-22 | Discharge: 2024-01-22 | Disposition: A | Discharge status: Home / Self Care | Source: Ambulatory Visit | Attending: Urology | Admitting: Urology

## 2024-01-22 DIAGNOSIS — N35919 Unspecified urethral stricture, male, unspecified site: Secondary | ICD-10-CM | POA: Insufficient documentation

## 2024-01-22 DIAGNOSIS — I10 Essential (primary) hypertension: Secondary | ICD-10-CM | POA: Insufficient documentation

## 2024-01-22 DIAGNOSIS — K219 Gastro-esophageal reflux disease without esophagitis: Secondary | ICD-10-CM | POA: Insufficient documentation

## 2024-01-22 DIAGNOSIS — Z87891 Personal history of nicotine dependence: Secondary | ICD-10-CM | POA: Diagnosis not present

## 2024-01-22 DIAGNOSIS — J449 Chronic obstructive pulmonary disease, unspecified: Secondary | ICD-10-CM | POA: Insufficient documentation

## 2024-01-22 HISTORY — PX: CYSTOURETHROSCOPY, W/ URETHRAL STRICTURE DILATION USING DRUG-COATED BALLOON: SHX7696

## 2024-01-22 MED ORDER — CEFAZOLIN SODIUM-DEXTROSE 2-3 GM-%(50ML) IV SOLR
INTRAVENOUS | Status: DC | PRN
  Administered 2024-01-22: 2 g via INTRAVENOUS

## 2024-01-22 MED ORDER — OXYCODONE HCL 5 MG PO TABS: 5.0000 mg | ORAL_TABLET | Freq: Once | ORAL | Status: DC | PRN

## 2024-01-22 MED ORDER — PROPOFOL 10 MG/ML IV BOLUS
INTRAVENOUS | Status: DC | PRN
  Administered 2024-01-22: 40 mg via INTRAVENOUS
  Administered 2024-01-22: 160 mg via INTRAVENOUS

## 2024-01-22 MED ORDER — CEFAZOLIN SODIUM-DEXTROSE 2-4 GM/100ML-% IV SOLN: 2.0000 g | INTRAVENOUS | Status: DC

## 2024-01-22 MED ORDER — TRAMADOL HCL 50 MG PO TABS: 25.0000 mg | ORAL_TABLET | Freq: Four times a day (QID) | ORAL | 0 refills | Status: AC | PRN

## 2024-01-22 MED ORDER — STERILE WATER FOR IRRIGATION IR SOLN
Status: DC | PRN
  Administered 2024-01-22: 3000 mL

## 2024-01-22 MED ORDER — DEXAMETHASONE SOD PHOSPHATE PF 10 MG/ML IJ SOLN
INTRAMUSCULAR | Status: DC | PRN
  Administered 2024-01-22: 10 mg via INTRAVENOUS

## 2024-01-22 MED ORDER — CIPROFLOXACIN IN D5W 400 MG/200ML IV SOLN
INTRAVENOUS | Status: AC
  Filled 2024-01-22: qty 200

## 2024-01-22 MED ORDER — PHENYLEPHRINE 80 MCG/ML (10ML) SYRINGE FOR IV PUSH (FOR BLOOD PRESSURE SUPPORT)
PREFILLED_SYRINGE | INTRAVENOUS | Status: DC | PRN
  Administered 2024-01-22 (×2): 80 ug via INTRAVENOUS

## 2024-01-22 MED ORDER — CHLORHEXIDINE GLUCONATE 0.12 % MT SOLN
OROMUCOSAL | Status: AC
  Filled 2024-01-22: qty 15

## 2024-01-22 MED ORDER — IOHEXOL 180 MG/ML  SOLN: INTRAMUSCULAR | Status: DC | PRN

## 2024-01-22 MED ORDER — ORAL CARE MOUTH RINSE: 15.0000 mL | Freq: Once | OROMUCOSAL | Status: AC

## 2024-01-22 MED ORDER — ONDANSETRON HCL 4 MG/2ML IJ SOLN: 4.0000 mg | Freq: Once | INTRAMUSCULAR | Status: DC | PRN

## 2024-01-22 MED ORDER — GLYCOPYRROLATE 0.2 MG/ML IJ SOLN
INTRAMUSCULAR | Status: DC | PRN
  Administered 2024-01-22: .2 mg via INTRAVENOUS

## 2024-01-22 MED ORDER — LIDOCAINE HCL (CARDIAC) PF 100 MG/5ML IV SOSY
PREFILLED_SYRINGE | INTRAVENOUS | Status: DC | PRN
  Administered 2024-01-22: 100 mg via INTRAVENOUS

## 2024-01-22 MED ORDER — LACTATED RINGERS IV SOLN: INTRAVENOUS | Status: DC

## 2024-01-22 MED ORDER — ACETAMINOPHEN 10 MG/ML IV SOLN
INTRAVENOUS | Status: DC | PRN
  Administered 2024-01-22: 1000 mg via INTRAVENOUS

## 2024-01-22 MED ORDER — CIPROFLOXACIN IN D5W 400 MG/200ML IV SOLN
400.0000 mg | Freq: Once | INTRAVENOUS | Status: AC
  Administered 2024-01-22: 400 mg via INTRAVENOUS

## 2024-01-22 MED ORDER — OXYCODONE HCL 5 MG/5ML PO SOLN: 5.0000 mg | Freq: Once | ORAL | Status: DC | PRN

## 2024-01-22 MED ORDER — ONDANSETRON HCL 4 MG/2ML IJ SOLN
INTRAMUSCULAR | Status: DC | PRN
  Administered 2024-01-22: 4 mg via INTRAVENOUS

## 2024-01-22 MED ORDER — CHLORHEXIDINE GLUCONATE 0.12 % MT SOLN
15.0000 mL | Freq: Once | OROMUCOSAL | Status: AC
  Administered 2024-01-22: 15 mL via OROMUCOSAL

## 2024-01-22 MED ORDER — CEFAZOLIN SODIUM-DEXTROSE 2-4 GM/100ML-% IV SOLN
INTRAVENOUS | Status: AC
  Filled 2024-01-22: qty 100

## 2024-01-22 MED ORDER — FENTANYL CITRATE (PF) 100 MCG/2ML IJ SOLN: 25.0000 ug | INTRAMUSCULAR | Status: DC | PRN

## 2024-01-22 NOTE — Anesthesia Postprocedure Evaluation (Signed)
"   Anesthesia Post Note  Patient: Wesley Preston.  Procedure(s) Performed: CYSTOURETHROSCOPY, WITH URETHRAL STRICTURE DILATION USING DRUG-COATED BALLOON  Patient location during evaluation: PACU Anesthesia Type: General Level of consciousness: awake Pain management: satisfactory to patient Vital Signs Assessment: post-procedure vital signs reviewed and stable Respiratory status: nonlabored ventilation Cardiovascular status: stable Anesthetic complications: no   No notable events documented.   Last Vitals:  Vitals:   01/22/24 1230 01/22/24 1241  BP: 114/74 126/78  Pulse: 75 71  Resp: 14 18  Temp:    SpO2: 98% 96%    Last Pain:  Vitals:   01/22/24 1241  TempSrc:   PainSc: 0-No pain                 [name],[name]      "

## 2024-01-22 NOTE — Transfer of Care (Signed)
 Immediate Anesthesia Transfer of Care Note  Patient: Wesley Preston.  Procedure(s) Performed: CYSTOURETHROSCOPY, WITH URETHRAL STRICTURE DILATION USING DRUG-COATED BALLOON  Patient Location: PACU  Anesthesia Type:General  Level of Consciousness: awake, drowsy, and patient cooperative  Airway & Oxygen Therapy: Patient Spontanous Breathing and Patient connected to face mask oxygen  Post-op Assessment: Report given to RN and Post -op Vital signs reviewed and stable  Post vital signs: Reviewed and stable  Last Vitals:  Vitals Value Taken Time  BP    Temp    Pulse    Resp    SpO2      Last Pain:  Vitals:   01/22/24 1022  TempSrc: Temporal  PainSc: 0-No pain         Complications: No notable events documented.

## 2024-01-22 NOTE — H&P (Signed)
 "  01/22/24 11:17 AM   Wesley Preston. Jun 02, 1940 [id number]  CC: Urethral stricture, incomplete emptying  HPI: Complex history-initially presented with incomplete bladder emptying and recurrent UTIs-cystoscopy showed diffuse mild narrowing of the urethra and enlarged prostate.  Underwent uncomplicated HOLEP May 2021 with removal of 43 g benign tissue.  Did have issues with fossa navicularis stricture postop that required self dilations, but had been voiding spontaneously for essentially the last 4 years.  Nocturia 2 times at night, occasional weak stream.  Developed Foley dependent retention in November, and voiding trial with elevated PVRs and he was started back on catheterizations 3-4 times daily.  Recent PSA normal at 0.2.   On cystoscopy prostatic fossa wide open from prior HOLEP, but does have some persistent urethral stricture disease, I do think he would benefit and have improved emptying and likely be able to stop catheterizations with DVIU/Optilume balloon dilation.  Risks and benefits discussed including bleeding, infection, 20% recurrence risk, possible need for ongoing incomplete emptying secondary to atonic bladder that could require catheterization.  He would like to pursue all options to avoid catheterizations.  PVR today , he will continue to catheterize up to surgery date.  PMH: Past Medical History:  Diagnosis Date   Anxiety    Back pain    BPH (benign prostatic hyperplasia)    Cancer (HCC)    BASAL CELL   Chest pain, non-cardiac    Colon polyps    Complication of anesthesia    anesthesia stays a long time   COPD (chronic obstructive pulmonary disease) (HCC)    Depression    Essential tremor    Essential tremor    Family history of adverse reaction to anesthesia    DAUGHTERS GET NAUSEATED   GERD (gastroesophageal reflux disease)    H/O   Helicobacter pylori (H. pylori) infection    Hyperlipidemia    Neuromuscular disorder (HCC)    TREMORS   Reactive  airway disease    Reactive airway disease    Restless leg syndrome     Surgical History: Past Surgical History:  Procedure Laterality Date   CATARACT EXTRACTION     COLONOSCOPY  2013   COLONOSCOPY WITH PROPOFOL  N/A 03/21/2016   Procedure: COLONOSCOPY WITH PROPOFOL ;  Surgeon: Lamar ONEIDA Holmes, MD;  Location: [facility] ENDOSCOPY;  Service: Endoscopy;  Laterality: N/A;   COLONOSCOPY WITH PROPOFOL  N/A 03/09/2018   Procedure: COLONOSCOPY WITH PROPOFOL ;  Surgeon: Holmes Lamar ONEIDA, MD;  Location: [facility] ENDOSCOPY;  Service: Endoscopy;  Laterality: N/A;   ESOPHAGOGASTRODUODENOSCOPY     ESOPHAGOGASTRODUODENOSCOPY N/A 03/09/2018   Procedure: ESOPHAGOGASTRODUODENOSCOPY (EGD);  Surgeon: Holmes Lamar ONEIDA, MD;  Location: [facility] ENDOSCOPY;  Service: Endoscopy;  Laterality: N/A;   HERNIA REPAIR     HOLEP-LASER ENUCLEATION OF THE PROSTATE WITH MORCELLATION N/A 05/10/2019   Procedure: HOLEP-LASER ENUCLEATION OF THE PROSTATE WITH MORCELLATION;  Surgeon: Francisca Redell BROCKS, MD;  Location: [facility] ORS;  Service: Urology;  Laterality: N/A;   INGUINAL HERNIA REPAIR Right 08/01/2014   Procedure: RIGHT INGUINAL HERNIA  REPAIR WITH MESH ;  Surgeon: Reyes LELON Cota, MD;  Location: [facility] ORS;  Service: General;  Laterality: Right; with large Ultra Pro mesh   NASAL SINUS SURGERY     ROTATOR CUFF REPAIR     TRIGGER FINGER RELEASE       Family History: Family History  Problem Relation Age of Onset   Heart disease Father     Social History:  reports that he quit smoking about 36 years ago.  His smoking use included cigarettes. He started smoking about 56 years ago. He has a 20 pack-year smoking history. He has never used smokeless tobacco. He reports current alcohol use of about 24.0 standard drinks of alcohol per week. He reports that he does not use drugs.  Physical Exam: BP (!) 144/77   Pulse 63   Temp (!) 96.8 F (36 C) (Temporal)   Resp 16   SpO2 95%    Constitutional:  Alert and oriented, No acute  distress. Cardiovascular:  Respiratory: Normal respiratory effort, no increased work of breathing. GI: Abdomen is soft, nontender, nondistended, no abdominal masses   Laboratory Data: Urine culture 01/07/2018 to 26 Staph epidermidis, on culture appropriate antibiotics  Assessment & Plan:   Complex history-initially presented with incomplete bladder emptying and recurrent UTIs-cystoscopy showed diffuse mild narrowing of the urethra and enlarged prostate.  Underwent uncomplicated HOLEP May 2021 with removal of 43 g benign tissue.  Did have issues with fossa navicularis stricture postop that required self dilations, but had been voiding spontaneously for essentially the last 4 years.  Nocturia 2 times at night, occasional weak stream.  Developed Foley dependent retention in November, and voiding trial with elevated PVRs and he was started back on catheterizations 3-4 times daily.  Recent PSA normal at 0.2.   On cystoscopy prostatic fossa wide open from prior HOLEP, but does have some persistent urethral stricture disease, I do think he would benefit and have improved emptying and likely be able to stop catheterizations with DVIU/Optilume balloon dilation.  Risks and benefits discussed including bleeding, infection, 20% recurrence risk, possible need for ongoing incomplete emptying secondary to atonic bladder that could require catheterization.  He would like to pursue all options to avoid catheterizations.  PVR today , he will continue to catheterize up to surgery date.  Cystoscopy, direct vision internal urethrotomy, Optilume balloon dilation  Redell Burnet, MD 01/22/2024  [facility] [address] [place], [place] [address] [phone]   "

## 2024-01-22 NOTE — Anesthesia Preprocedure Evaluation (Signed)
 "                                  Anesthesia Evaluation  Patient identified by MRN, date of birth, ID band Patient awake    Reviewed: Allergy & Precautions, NPO status , Patient's Chart, lab work & pertinent test results  History of Anesthesia Complications (+) DIFFICULT AIRWAY and history of anesthetic complications  Airway Mallampati: III  TM Distance: <3 FB Neck ROM: Limited  Mouth opening: Limited Mouth Opening  Dental  (+) Caps, Partial Lower   Pulmonary neg pulmonary ROS, COPD, Patient abstained from smoking., former smoker   Pulmonary exam normal  + decreased breath sounds      Cardiovascular Exercise Tolerance: Good hypertension, On Medications negative cardio ROS Normal cardiovascular exam Rhythm:Regular Rate:Normal     Neuro/Psych   Anxiety     negative neurological ROS  negative psych ROS   GI/Hepatic negative GI ROS, Neg liver ROS,GERD  Medicated,,  Endo/Other  negative endocrine ROS    Renal/GU negative Renal ROS  negative genitourinary   Musculoskeletal negative musculoskeletal ROS (+)    Abdominal Normal abdominal exam  (+)   Peds negative pediatric ROS (+)  Hematology negative hematology ROS (+)   Anesthesia Other Findings Past Medical History: No date: Anxiety No date: Back pain No date: BPH (benign prostatic hyperplasia) No date: Cancer (HCC)     Comment:  BASAL CELL No date: Chest pain, non-cardiac No date: Colon polyps No date: Complication of anesthesia     Comment:  anesthesia stays a long time No date: COPD (chronic obstructive pulmonary disease) (HCC) No date: Depression No date: Essential tremor No date: Essential tremor No date: Family history of adverse reaction to anesthesia     Comment:  DAUGHTERS GET NAUSEATED No date: GERD (gastroesophageal reflux disease)     Comment:  H/O No date: Helicobacter pylori (H. pylori) infection No date: Hyperlipidemia No date: Neuromuscular disorder (HCC)     Comment:   TREMORS No date: Reactive airway disease No date: Reactive airway disease No date: Restless leg syndrome  Past Surgical History: No date: CATARACT EXTRACTION 2013: COLONOSCOPY 03/21/2016: COLONOSCOPY WITH PROPOFOL ; N/A     Comment:  Procedure: COLONOSCOPY WITH PROPOFOL ;  Surgeon: Lamar ONEIDA Holmes, MD;  Location: [facility] ENDOSCOPY;  Service:               Endoscopy;  Laterality: N/A; 03/09/2018: COLONOSCOPY WITH PROPOFOL ; N/A     Comment:  Procedure: COLONOSCOPY WITH PROPOFOL ;  Surgeon: Holmes Lamar ONEIDA, MD;  Location: [facility] ENDOSCOPY;  Service:               Endoscopy;  Laterality: N/A; No date: ESOPHAGOGASTRODUODENOSCOPY 03/09/2018: ESOPHAGOGASTRODUODENOSCOPY; N/A     Comment:  Procedure: ESOPHAGOGASTRODUODENOSCOPY (EGD);  Surgeon:               Holmes Lamar ONEIDA, MD;  Location: [facility] ENDOSCOPY;                Service: Endoscopy;  Laterality: N/A; No date: HERNIA REPAIR 05/10/2019: HOLEP-LASER ENUCLEATION OF THE PROSTATE WITH  MORCELLATION; N/A     Comment:  Procedure: HOLEP-LASER ENUCLEATION OF THE PROSTATE WITH               MORCELLATION;  Surgeon: Francisca Redell BROCKS,  MD;  Location:               [facility] ORS;  Service: Urology;  Laterality: N/A; 08/01/2014: INGUINAL HERNIA REPAIR; Right     Comment:  Procedure: RIGHT INGUINAL HERNIA  REPAIR WITH MESH ;                Surgeon: Reyes LELON Cota, MD;  Location: [facility] ORS;                Service: General;  Laterality: Right; with large Ultra               Pro mesh No date: NASAL SINUS SURGERY No date: ROTATOR CUFF REPAIR No date: TRIGGER FINGER RELEASE     Reproductive/Obstetrics negative OB ROS                              Anesthesia Physical Anesthesia Plan  ASA: 3  Anesthesia Plan: General   Post-op Pain Management:    Induction: Intravenous  PONV Risk Score and Plan: Dexamethasone , Ondansetron , Midazolam  and Treatment may vary due to age or medical condition  Airway  Management Planned: LMA  Additional Equipment:   Intra-op Plan:   Post-operative Plan: Extubation in OR  Informed Consent: I have reviewed the patients History and Physical, chart, labs and discussed the procedure including the risks, benefits and alternatives for the proposed anesthesia with the patient or authorized representative who has indicated his/her understanding and acceptance.     Dental Advisory Given  Plan Discussed with: CRNA  Anesthesia Plan Comments:         Anesthesia Quick Evaluation  "

## 2024-01-22 NOTE — Op Note (Signed)
 Date of procedure: 01/22/24  Preoperative diagnosis:  Urethral stricture  Postoperative diagnosis:  Same  Procedure: Cystoscopy, direct vision internal urethrotomy, Optilume balloon dilation  Surgeon: Redell Burnet, MD  Anesthesia: General  Complications: None  Intraoperative findings:  ~29F mid urethral stricture 1 to 2 cm in length Prostatic fossa wide open from prior HOLEP, normal cystoscopy  EBL: Minimal  Specimens: None  Drains: 18 French council Foley  Indication: Wesley Preston. is a 84 y.o. patient with history of urethral stricture and HOLEP in 2021 for incomplete emptying and recurrent UTI.  Recently developed recurrent retention and found to have a mid urethral stricture, wide open prostatic fossa.  After reviewing the management options for treatment, they elected to proceed with the above surgical procedure(s). We have discussed the potential benefits and risks of the procedure, side effects of the proposed treatment, the likelihood of the patient achieving the goals of the procedure, and any potential problems that might occur during the procedure or recuperation. Informed consent has been obtained.  Description of procedure:  The patient was taken to the operating room and general anesthesia was induced. SCDs were placed for DVT prophylaxis. The patient was placed in the dorsal lithotomy position, prepped and draped in the usual sterile fashion, and preoperative antibiotics were administered. A preoperative time-out was performed.   A 17.5 French rigid cystoscope was used to intubate the urethra and followed towards the bladder.  In the mid urethra there was approximately 8 French stricture 1 to 2 cm in length.  A wire was placed through the stricture into the bladder.  A 21 French UroMax balloon was dilated over the stricture to a pressure of 20 ATM.  I was still unable to advance the 17.5 French rigid cystoscope into the bladder.  A cold knife DVIU was performed  with incisions at 5 and 7 o'clock.  The stricture was fairly dense.  I was then able to advance the scope easily into the bladder, the prostatic fossa was wide open, bladder was grossly normal-appearing.  The 30 French by 5 cm Optilume balloon was then carefully positioned over the area of stricture and inflated to a pressure of 10 ATM and allowed to remain in place for 5 minutes.  The balloon was deflated and removed and an 9 Psychiatrist passed easily over the wire into the bladder with return of clear fluid, 10 mL were placed in the balloon.  Catheter was connected to drainage  Disposition: Stable to PACU  Plan: Keep follow-up as scheduled for voiding trial, and follow-up with me for symptom check Does not need Flomax  or finasteride moving forward  Redell Burnet, MD

## 2024-01-23 ENCOUNTER — Encounter: Payer: Self-pay | Admitting: Urology

## 2024-01-23 NOTE — Progress Notes (Unsigned)
 Catheter Removal  Patient is present today for a catheter removal.  ***ml of water was drained from the balloon. A ***FR foley cath was removed from the bladder, {dnt complications:20057}. Patient tolerated well.  Performed by: ***  Follow up/ Additional notes: No follow-ups on file.

## 2024-01-24 ENCOUNTER — Ambulatory Visit (INDEPENDENT_AMBULATORY_CARE_PROVIDER_SITE_OTHER): Admitting: Physician Assistant

## 2024-01-24 ENCOUNTER — Ambulatory Visit

## 2024-01-24 ENCOUNTER — Ambulatory Visit: Admitting: Urology

## 2024-01-24 VITALS — BP 134/73 | HR 67 | Wt 175.0 lb

## 2024-01-24 DIAGNOSIS — N35919 Unspecified urethral stricture, male, unspecified site: Secondary | ICD-10-CM

## 2024-01-24 DIAGNOSIS — R339 Retention of urine, unspecified: Secondary | ICD-10-CM

## 2024-01-24 LAB — BLADDER SCAN AMB NON-IMAGING: Scan Result: 291

## 2024-01-24 NOTE — Progress Notes (Addendum)
 Afternoon follow-up  Patient returned to clinic this afternoon for PVR. He has voided twice with some difficulty but is comfortable.  PVR 291 mL.  Results for orders placed or performed in visit on 01/24/24  Bladder Scan (Post Void Residual) in office   Collection Time: 01/24/24  3:47 PM  Result Value Ref Range   Scan Result 291     Voiding trial passed. Counseled patient to resume CIC as needed and he agreed.  Due to BPH, anatomy, and hand tremor, patient is unable to pass a SpeediCath standard straight tip catheter to the level of the bladder.  He requires a Luja coud catheter to self cath 2 times daily.  Follow up: Return in about 4 weeks (around 02/21/2024) for Postop f/u with Dr. Francisca.

## 2024-01-26 ENCOUNTER — Telehealth: Payer: Self-pay

## 2024-01-26 NOTE — Telephone Encounter (Signed)
 Incoming call from pt and wife who state that the patient has noticed bruising and swelling of the LT testicle overnight. Had post op appt on 01/24/23 no bruising at that time. Pt states LT testicle is a purple/blue color. Pt denies pain, fever, or chills. He also notes he had to self cath last night due to bladder feeling full and not being able to void. Advised pt on supportive underwear and icing (not directly on skin). Pt voiced understanding. Pt advised to seek care in the ED should symptoms worsen. Pt voiced understanding.

## 2024-02-06 NOTE — Telephone Encounter (Signed)
 This encounter was created in error - please disregard.

## 2024-02-07 ENCOUNTER — Telehealth: Payer: Self-pay

## 2024-02-07 NOTE — Telephone Encounter (Signed)
 Pts wife called triage VM stating she is going with [facility] for their cath supplies and that they should have sent over a order form for us  to fill out for them. She also stated that pt is running low on cath supplies and is requesting samples from us . She wishes a return phone call.

## 2024-02-08 ENCOUNTER — Telehealth: Payer: Self-pay

## 2024-02-08 NOTE — Telephone Encounter (Signed)
 Called patient and talked to him and his wife, and let them know that I had called [facility] and left a message for [employer] [facility] to call us  back. The wife let me know that she had called and talked to [employer] [facility] and its straighten out. The wife also said that he might need a few Caths if they don't show up today. I let the wife know that we didn't have 54fr luja coude but we had 11fr luja coude and she said that the 19fr luja coude would not work.

## 2024-02-08 NOTE — Telephone Encounter (Signed)
 Called [employer] [facility] this morning at 8:28am and left a message for them to call back.

## 2024-02-09 ENCOUNTER — Telehealth: Payer: Self-pay

## 2024-02-09 NOTE — Telephone Encounter (Signed)
 Florence Mom with [employer] back. Left a voice mail to call back

## 2024-02-09 NOTE — Telephone Encounter (Signed)
 Nancy from [employer] called back on the Triage line.

## 2024-02-09 NOTE — Telephone Encounter (Signed)
 Called Wesley Preston with [employer] and left a voice mail to call back.

## 2024-02-20 ENCOUNTER — Ambulatory Visit: Admitting: Urology
